# Patient Record
Sex: Male | Born: 1965 | Race: White | Hispanic: No | Marital: Married | Smoking: Never smoker
Health system: Southern US, Community
[De-identification: ages and names within clinical notes are randomized; demographics above are authoritative.]

## PROBLEM LIST (undated history)

## (undated) DIAGNOSIS — J189 Pneumonia, unspecified organism: Secondary | ICD-10-CM

## (undated) DIAGNOSIS — J45909 Unspecified asthma, uncomplicated: Secondary | ICD-10-CM

## (undated) DIAGNOSIS — M3500 Sicca syndrome, unspecified: Secondary | ICD-10-CM

## (undated) DIAGNOSIS — J302 Other seasonal allergic rhinitis: Secondary | ICD-10-CM

## (undated) DIAGNOSIS — K219 Gastro-esophageal reflux disease without esophagitis: Secondary | ICD-10-CM

## (undated) DIAGNOSIS — A159 Respiratory tuberculosis unspecified: Secondary | ICD-10-CM

## (undated) DIAGNOSIS — E785 Hyperlipidemia, unspecified: Secondary | ICD-10-CM

## (undated) HISTORY — PX: SQUAMOUS CELL CARCINOMA EXCISION: SHX2433

## (undated) HISTORY — PX: KNEE SURGERY: SHX244

## (undated) HISTORY — PX: HERNIA REPAIR: SHX51

## (undated) HISTORY — PX: COLONOSCOPY: SHX174

## (undated) HISTORY — DX: Sjogren syndrome, unspecified: M35.00

## (undated) HISTORY — PX: APPENDECTOMY: SHX54

## (undated) HISTORY — PX: WISDOM TOOTH EXTRACTION: SHX21

## (undated) HISTORY — PX: SHOULDER SURGERY: SHX246

---

## 2012-07-30 ENCOUNTER — Emergency Department: Payer: Self-pay | Admitting: Emergency Medicine

## 2012-07-30 ENCOUNTER — Ambulatory Visit: Payer: Self-pay | Admitting: Family Medicine

## 2012-07-30 LAB — BASIC METABOLIC PANEL
Anion Gap: 8 (ref 7–16)
Co2: 25 mmol/L (ref 21–32)
EGFR (Non-African Amer.): >60
Osmolality: 280 (ref 275–301)
Potassium: 3.7 mmol/L (ref 3.5–5.1)
Sodium: 140 mmol/L (ref 136–145)

## 2012-07-30 LAB — CBC
HCT: 40.8 % (ref 40.0–52.0)
MCH: 32.2 pg (ref 26.0–34.0)
Platelet: 215 10*3/uL (ref 150–440)
RBC: 4.46 10*6/uL (ref 4.40–5.90)
WBC: 5.8 10*3/uL (ref 3.8–10.6)

## 2012-07-30 LAB — TROPONIN I: Troponin-I: <0.02 ng/mL

## 2012-07-31 ENCOUNTER — Ambulatory Visit: Payer: Self-pay | Admitting: Family Medicine

## 2013-08-30 ENCOUNTER — Other Ambulatory Visit: Payer: Self-pay | Admitting: Family Medicine

## 2013-08-30 LAB — COMPREHENSIVE METABOLIC PANEL
Albumin: 3.9 g/dL (ref 3.4–5.0)
Alkaline Phosphatase: 62 U/L
Anion Gap: 2 — ABNORMAL LOW (ref 7–16)
BUN: 15 mg/dL (ref 7–18)
Bilirubin,Total: 0.9 mg/dL (ref 0.2–1.0)
Calcium, Total: 9 mg/dL (ref 8.5–10.1)
Chloride: 107 mmol/L (ref 98–107)
Co2: 29 mmol/L (ref 21–32)
Creatinine: 1.03 mg/dL (ref 0.60–1.30)
EGFR (African American): >60
EGFR (Non-African Amer.): >60
Glucose: 95 mg/dL (ref 65–99)
Osmolality: 276 (ref 275–301)
Potassium: 4.2 mmol/L (ref 3.5–5.1)
SGOT(AST): 38 U/L — ABNORMAL HIGH (ref 15–37)
SGPT (ALT): 69 U/L (ref 12–78)
Sodium: 138 mmol/L (ref 136–145)
Total Protein: 8.8 g/dL — ABNORMAL HIGH (ref 6.4–8.2)

## 2013-08-30 LAB — CBC WITH DIFFERENTIAL/PLATELET
Basophil #: 0 10*3/uL (ref 0.0–0.1)
Basophil %: 0.8 %
Eosinophil #: 0.1 10*3/uL (ref 0.0–0.7)
Eosinophil %: 1.1 %
HCT: 43.2 % (ref 40.0–52.0)
HGB: 15.3 g/dL (ref 13.0–18.0)
Lymphocyte #: 1.3 10*3/uL (ref 1.0–3.6)
Lymphocyte %: 24.4 %
MCH: 32.5 pg (ref 26.0–34.0)
MCHC: 35.4 g/dL (ref 32.0–36.0)
MCV: 92 fL (ref 80–100)
Monocyte #: 0.5 x10 3/mm (ref 0.2–1.0)
Monocyte %: 9.5 %
Neutrophil #: 3.5 10*3/uL (ref 1.4–6.5)
Neutrophil %: 64.2 %
Platelet: 232 10*3/uL (ref 150–440)
RBC: 4.7 10*6/uL (ref 4.40–5.90)
RDW: 12.7 % (ref 11.5–14.5)
WBC: 5.4 10*3/uL (ref 3.8–10.6)

## 2014-01-09 IMAGING — CT CT CHEST W/ CM
1 series · 15 of 32 positions shown, 19 images · IV contrast (APPLIED)
Comparison: none

REASON FOR EXAM: ADD ON CALL REPORT [DATE] chest pain
COMMENTS:

[Series 4: soft tissue · axial · 0.72mm/px · z∈[-162,+90]mm · 15 of 94 slices shown, 19 images]
[im 7/94  soft-tissue]
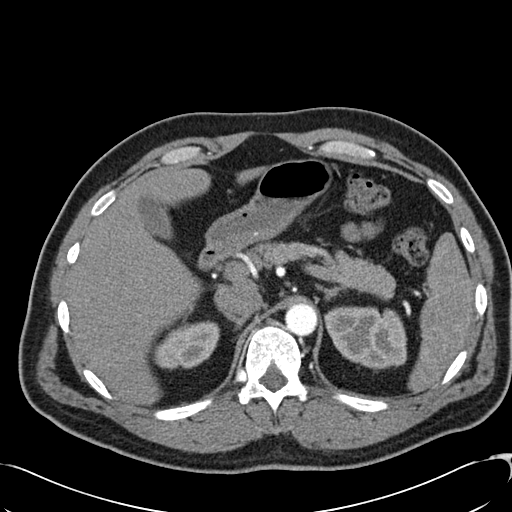
[im 7/94  bone]
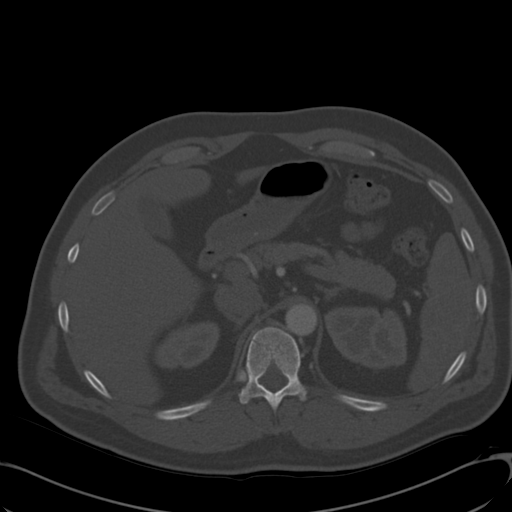
[im 13/94  soft-tissue]
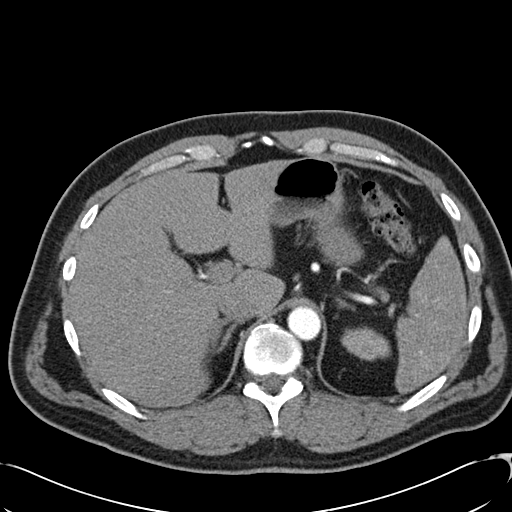
[im 19/94  soft-tissue]
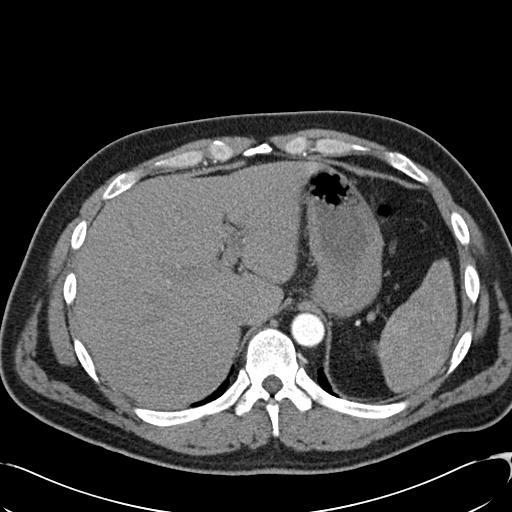
[im 28/94  soft-tissue]
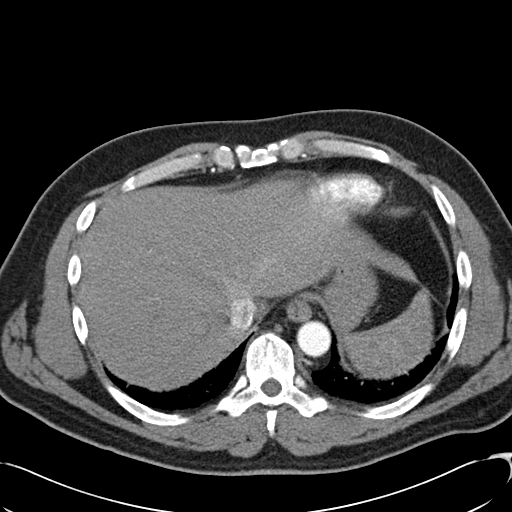
[im 34/94  soft-tissue]
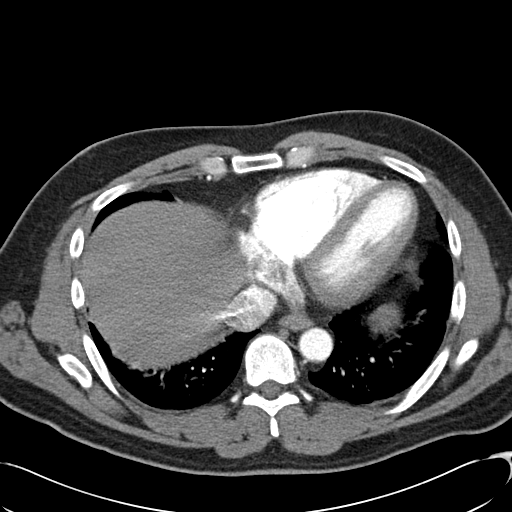
[im 40/94  soft-tissue]
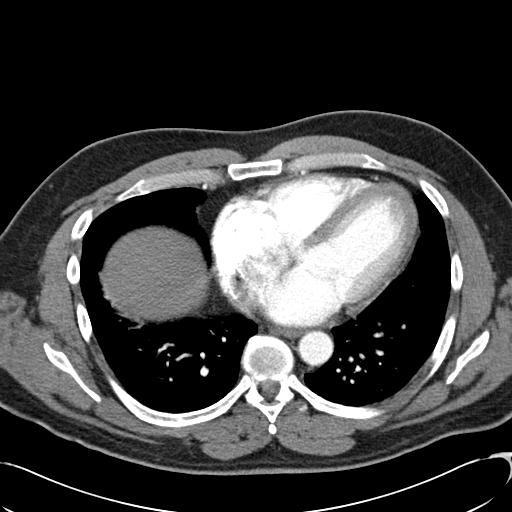
[im 49/94  soft-tissue]
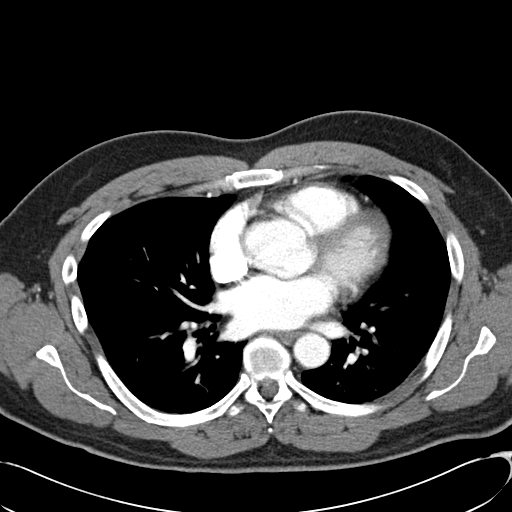
[im 55/94  soft-tissue]
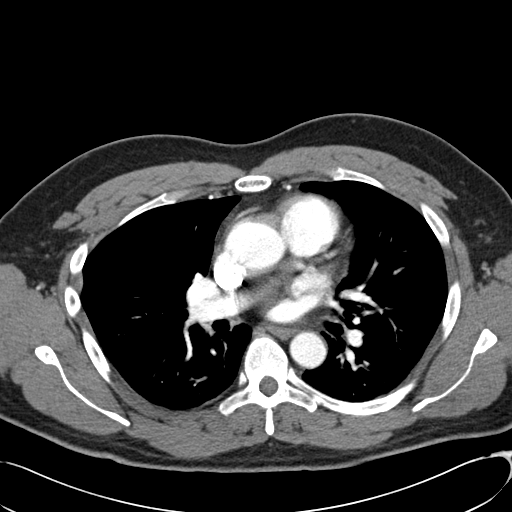
[im 61/94  soft-tissue]
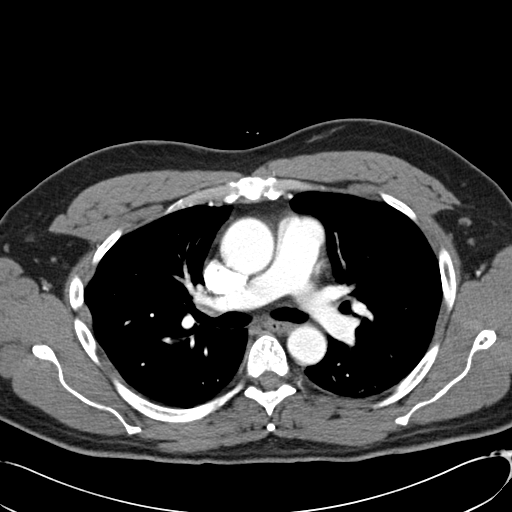
[im 61/94  bone]
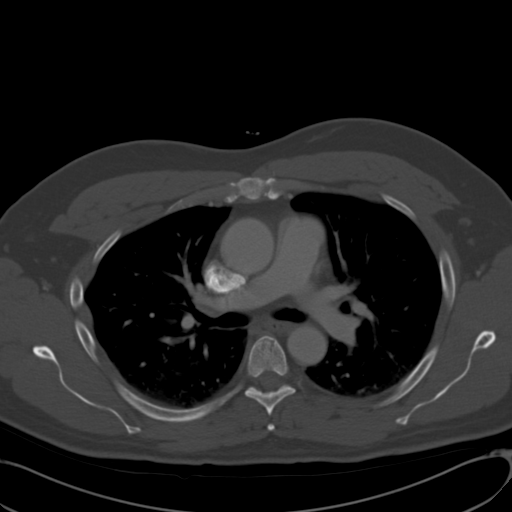
[im 67/94  soft-tissue]
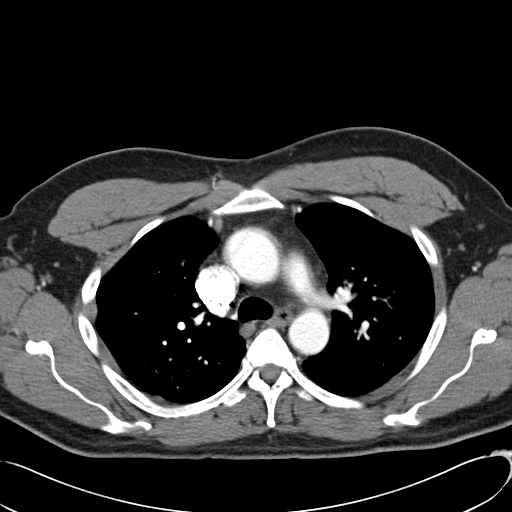
[im 76/94  soft-tissue]
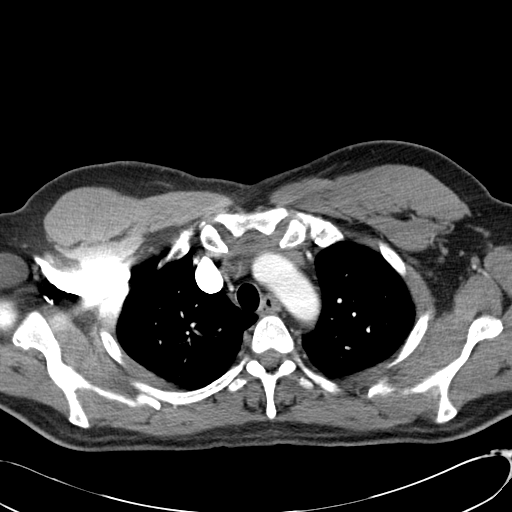
[im 82/94  soft-tissue]
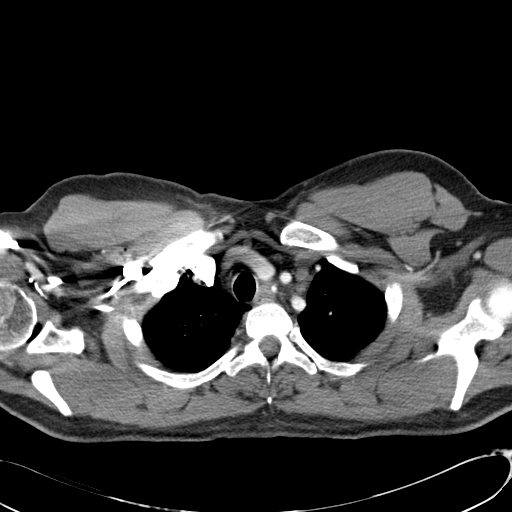
[im 82/94  lung]
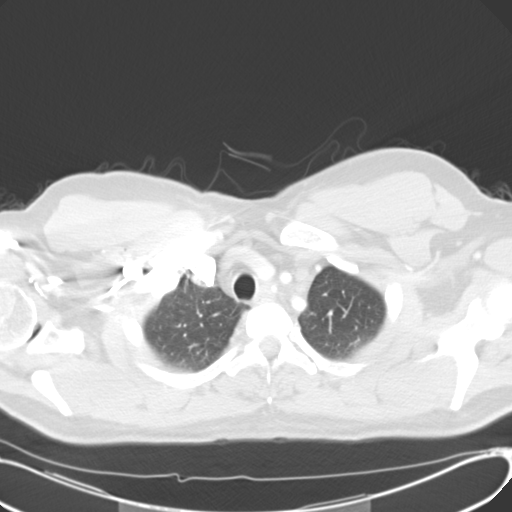
[im 85/94  lung]
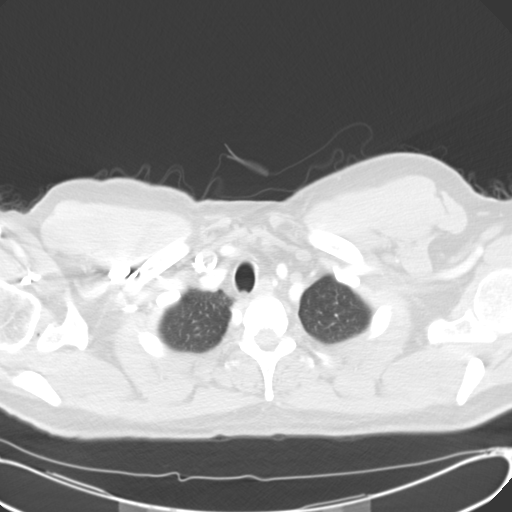
[im 88/94  soft-tissue]
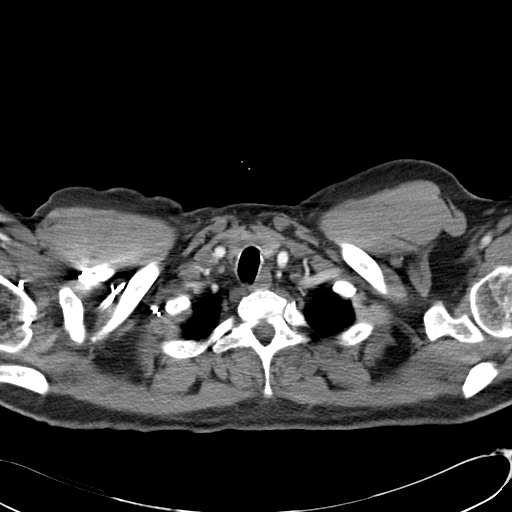
[im 88/94  lung]
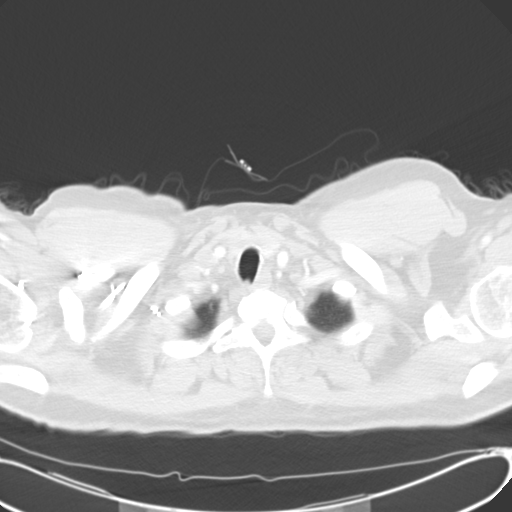
[im 91/94  lung]
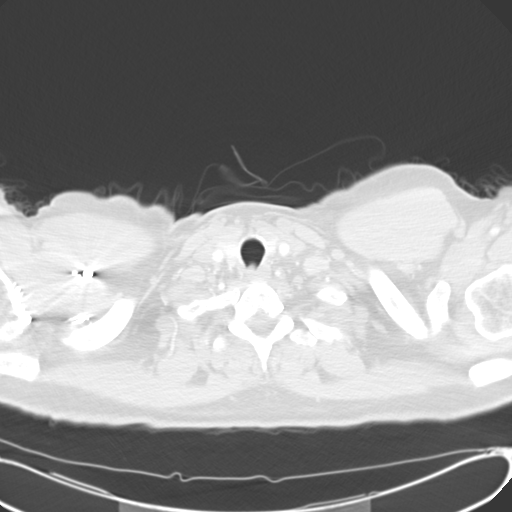

[15 of 32 positions shown; findings below may reference images not displayed]

PROCEDURE:     CT  - CT CHEST WITH CONTRAST  - July 31, 2012 [DATE]

RESULT:     Chest CT is performed with 100 mL of [id number] iodinated
intravenous contrast with images reconstructed at 3.0 mm slice thickness in
the axial plane. There is no previous CT for comparison.

There is respiratory motion artifact. There is minimal nodularity along the
major fissure in the right lung demonstrated on images 43 and 44 measuring 5
mm. Followup in 6 months without contrast to document stability is
recommended. This certainly may represent an area of fibrosis. Dependent
atelectasis is present. There is no definite infiltrate, evidence of
significant edema, effusion or pneumothorax. No focal endobronchial lesion
is evident. The included upper abdominal structures appear unremarkable. The
thoracic aorta is normal in caliber without dissection. The pulmonary
arterial system appears to opacify without filling defect to suggest
pulmonary embolism.
IMPRESSION: 1. No findings of pulmonary embolism.
2. No thoracic aortic aneurysm or dissection evident.
3. Minimal dependent atelectasis.
4. Nonspecific nodular density along the major fissure in the right
hemithorax. Followup noncontrast CT of the chest is recommended in the 6
months.

[REDACTED]

## 2014-06-10 LAB — CBC AND DIFFERENTIAL
HEMATOCRIT: 41 % (ref 41–53)
Hemoglobin: 14.8 g/dL (ref 13.5–17.5)
Neutrophils Absolute: 66 /uL
Platelets: 246 10*3/uL (ref 150–399)
WBC: 5.7 10*3/mL

## 2014-06-10 LAB — BASIC METABOLIC PANEL
BUN: 16 mg/dL (ref 4–21)
Creatinine: 1.2 mg/dL (ref 0.6–1.3)
Glucose: 109 mg/dL
Potassium: 4.5 mmol/L (ref 3.4–5.3)
Sodium: 138 mmol/L (ref 137–147)

## 2014-06-10 LAB — HEPATIC FUNCTION PANEL
ALK PHOS: 51 U/L (ref 25–125)
ALT: 33 U/L (ref 10–40)
AST: 20 U/L (ref 14–40)
BILIRUBIN, TOTAL: 1 mg/dL

## 2014-06-10 LAB — LIPID PANEL
Cholesterol: 191 mg/dL (ref 0–200)
HDL: 42 mg/dL (ref 35–70)
LDL Cholesterol: 125 mg/dL
LDL/HDL RATIO: 3
TRIGLYCERIDES: 121 mg/dL (ref 40–160)

## 2014-06-10 LAB — PSA: PSA: 0.4

## 2014-06-10 LAB — TSH: TSH: 1.79 u[IU]/mL (ref 0.41–5.90)

## 2014-11-12 DIAGNOSIS — K449 Diaphragmatic hernia without obstruction or gangrene: Secondary | ICD-10-CM | POA: Insufficient documentation

## 2014-11-12 DIAGNOSIS — N400 Enlarged prostate without lower urinary tract symptoms: Secondary | ICD-10-CM | POA: Insufficient documentation

## 2014-11-12 DIAGNOSIS — K219 Gastro-esophageal reflux disease without esophagitis: Secondary | ICD-10-CM | POA: Insufficient documentation

## 2014-11-12 DIAGNOSIS — R079 Chest pain, unspecified: Secondary | ICD-10-CM | POA: Insufficient documentation

## 2014-11-12 DIAGNOSIS — E7800 Pure hypercholesterolemia, unspecified: Secondary | ICD-10-CM | POA: Insufficient documentation

## 2014-11-12 DIAGNOSIS — C4491 Basal cell carcinoma of skin, unspecified: Secondary | ICD-10-CM | POA: Insufficient documentation

## 2014-11-12 DIAGNOSIS — J309 Allergic rhinitis, unspecified: Secondary | ICD-10-CM | POA: Insufficient documentation

## 2014-11-12 DIAGNOSIS — Z820 Family history of epilepsy and other diseases of the nervous system: Secondary | ICD-10-CM | POA: Insufficient documentation

## 2014-11-12 DIAGNOSIS — J45909 Unspecified asthma, uncomplicated: Secondary | ICD-10-CM | POA: Insufficient documentation

## 2014-11-12 DIAGNOSIS — G4730 Sleep apnea, unspecified: Secondary | ICD-10-CM | POA: Insufficient documentation

## 2014-12-23 ENCOUNTER — Ambulatory Visit (INDEPENDENT_AMBULATORY_CARE_PROVIDER_SITE_OTHER): Payer: Commercial Managed Care - PPO | Admitting: Family Medicine

## 2014-12-23 ENCOUNTER — Encounter: Payer: Self-pay | Admitting: Family Medicine

## 2014-12-23 VITALS — BP 100/62 | HR 68 | Temp 97.8°F | Resp 16 | Wt 210.0 lb

## 2014-12-23 DIAGNOSIS — Z8249 Family history of ischemic heart disease and other diseases of the circulatory system: Secondary | ICD-10-CM | POA: Diagnosis not present

## 2014-12-23 DIAGNOSIS — J45990 Exercise induced bronchospasm: Secondary | ICD-10-CM | POA: Diagnosis not present

## 2014-12-23 DIAGNOSIS — C801 Malignant (primary) neoplasm, unspecified: Secondary | ICD-10-CM | POA: Diagnosis not present

## 2014-12-23 DIAGNOSIS — Z1211 Encounter for screening for malignant neoplasm of colon: Secondary | ICD-10-CM

## 2014-12-23 DIAGNOSIS — IMO0002 Reserved for concepts with insufficient information to code with codable children: Secondary | ICD-10-CM

## 2014-12-23 MED ORDER — MOMETASONE FUROATE 200 MCG/ACT IN AERO: 2.0000 | INHALATION_SPRAY | Freq: Two times a day (BID) | RESPIRATORY_TRACT | Status: DC

## 2014-12-23 NOTE — Progress Notes (Signed)
Subjective:  Asthma He complains of chest tightness, cough, difficulty breathing, shortness of breath and wheezing. Primary symptoms comments: Only with exercise.. This is a chronic problem. The problem occurs constantly. His symptoms are aggravated by exercise. His symptoms are alleviated by rest. His past medical history is significant for asthma.   Pt reports that he stopped taking Dulera because of GI upset. He wants to discuss maybe try another inhaler or referral.  Patient reports a family history of DVT with factor 5 leiden deficiency.  Prior to Admission medications   Medication Sig Start Date End Date Taking? Authorizing Provider  albuterol (PROVENTIL HFA;VENTOLIN HFA) 108 (90 BASE) MCG/ACT inhaler Inhale 1-2 puffs into the lungs every 4 (four) hours as needed. 06/09/14   Historical Provider, MD  Cetirizine HCl 10 MG CAPS Take 1 tablet by mouth daily.    Historical Provider, MD  Cholecalciferol 1000 UNITS capsule Take 1 capsule by mouth daily.    Historical Provider, MD  lovastatin (MEVACOR) 20 MG tablet Take 1 tablet by mouth at bedtime. 06/09/14   Historical Provider, MD  mometasone-formoterol (DULERA) 100-5 MCG/ACT AERO Inhale 1-2 puffs into the lungs 2 (two) times daily. 06/09/14   Historical Provider, MD  Multiple Vitamins-Minerals (MENS MULTIVITAMIN PLUS) TABS Take 1 tablet by mouth daily.    Historical Provider, MD  omeprazole (PRILOSEC) 20 MG capsule Take 1 capsule by mouth 2 (two) times daily. 06/09/14   Historical Provider, MD    Patient Active Problem List   Diagnosis Date Noted  . Allergic rhinitis 11/12/2014  . 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 Basal cell carcinoma of skin 11/12/2014  . 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 Chest pain 11/12/2014  . Family history of neurological disease 11/12/2014  . Acid reflux 11/12/2014  . 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 Hypercholesteremia 11/12/2014  . 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  History   Social History  . Marital Status: Married    Spouse Name: N/A  . Number of Children: N/A  . Years of Education: N/A   Occupational History  . Not on file.   Social History Main Topics  . Smoking status: Not on file  . Smokeless tobacco: Not on file  . Alcohol Use: Not on file  . Drug Use: Not on file  . Sexual Activity: Not on file   Other Topics Concern  . Not on file   Social History Narrative  . No narrative on file    Allergies no known allergies  Review of Systems  Constitutional: Negative.   HENT: Negative.   Eyes: Negative.   Respiratory: Positive for cough, shortness of breath and wheezing.   Cardiovascular: Negative.   Gastrointestinal: Negative.   Genitourinary: Negative.   Musculoskeletal: Negative.   Skin: Negative.   Endo/Heme/Allergies: Negative.   Psychiatric/Behavioral: Negative.   All other systems reviewed and are negative.   Immunization History  Administered Date(s) Administered  . Hepatitis B 01/24/2002  . Pneumococcal Polysaccharide-23 06/09/2014  . Tdap 02/03/2008  . Typhoid Inactivated 01/24/2002   Objective:  There were no vitals taken for this visit.  Physical Exam  Constitutional: He is oriented to person, place, and time and well-developed, well-nourished, and in no distress.  HENT:  Head: Normocephalic and atraumatic.  Right Ear: External ear normal.  Left Ear: External ear normal.  Nose: Nose normal.  Mouth/Throat: Oropharynx is clear and moist.  Eyes: Conjunctivae and EOM are normal. Pupils are equal, round, and  reactive to light.  Neck: Normal range of motion. Neck supple.  Cardiovascular: Normal rate, regular rhythm, normal heart sounds and intact distal pulses.   Pulmonary/Chest: Effort normal and breath sounds normal.  Abdominal: Soft. Bowel sounds are normal.  Neurological: He is alert and oriented to person, place, and time. He has normal reflexes. Gait normal. GCS score is 15.  Skin:  Skin is warm and dry.  Right forearm squamous cell about 4cm   Psychiatric: Mood, memory, affect and judgment normal.    Lab Results  Component Value Date   WBC 5.7 06/10/2014   HGB 14.8 06/10/2014   HCT 41 06/10/2014   PLT 246 06/10/2014   GLUCOSE 95 08/30/2013   CHOL 191 06/10/2014   TRIG 121 06/10/2014   HDL 42 06/10/2014   LDLCALC 125 06/10/2014   TSH 1.79 06/10/2014   PSA 0.4 06/10/2014    CMP     Component Value Date/Time   NA 138 06/10/2014   NA 138 08/30/2013 1216   K 4.5 06/10/2014   K 4.2 08/30/2013 1216   CL 107 08/30/2013 1216   CO2 29 08/30/2013 1216   GLUCOSE 95 08/30/2013 1216   BUN 16 06/10/2014   BUN 15 08/30/2013 1216   CREATININE 1.2 06/10/2014   CREATININE 1.03 08/30/2013 1216   CALCIUM 9.0 08/30/2013 1216   PROT 8.8* 08/30/2013 1216   ALBUMIN 3.9 08/30/2013 1216   AST 20 06/10/2014   AST 38* 08/30/2013 1216   ALT 33 06/10/2014   ALT 69 08/30/2013 1216   ALKPHOS 51 06/10/2014   ALKPHOS 62 08/30/2013 1216   GFRNONAA >60 08/30/2013 1216   GFRAA >60 08/30/2013 1216    Assessment and Plan :  1. Exercise-induced asthma  - Spirometry with graph - Mometasone Furoate (ASMANEX HFA) 200 MCG/ACT AERO; Inhale 2 Inhalers into the lungs 2 (two) times daily. 2 puffs twice daily  Dispense: 2 Inhaler; Refill: 0  2. Encounter for screening colonoscopy  - Ambulatory referral to Gastroenterology  3. Family history of DVT  - Factor 5 leiden  4. 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  - Ambulatory referral to Dermatology  Miguel Aschoff MD [facility] 12/23/2014 3:35 PM

## 2014-12-28 LAB — FACTOR 5 LEIDEN

## 2014-12-29 ENCOUNTER — Encounter: Payer: Self-pay | Admitting: Family Medicine

## 2014-12-31 ENCOUNTER — Telehealth: Payer: Self-pay

## 2014-12-31 NOTE — Telephone Encounter (Signed)
-----   Message from Jerrol Banana., MD sent at 12/31/2014  2:19 PM EDT ----- Factor V test is normal.

## 2014-12-31 NOTE — Telephone Encounter (Signed)
LMTCB  aa 

## 2014-12-31 NOTE — Progress Notes (Signed)
LMTCB  aa 

## 2015-01-01 NOTE — Telephone Encounter (Signed)
Advised  ED 

## 2015-02-08 DIAGNOSIS — D0460 Carcinoma in situ of skin of unspecified upper limb, including shoulder: Secondary | ICD-10-CM

## 2015-02-08 HISTORY — DX: Carcinoma in situ of skin of unspecified upper limb, including shoulder: D04.60

## 2015-03-04 ENCOUNTER — Ambulatory Visit (INDEPENDENT_AMBULATORY_CARE_PROVIDER_SITE_OTHER): Payer: Commercial Managed Care - PPO | Admitting: Family Medicine

## 2015-03-04 ENCOUNTER — Encounter: Payer: Self-pay | Admitting: Family Medicine

## 2015-03-04 VITALS — BP 102/60 | HR 72 | Temp 98.0°F | Resp 16 | Wt 205.0 lb

## 2015-03-04 DIAGNOSIS — S46001A Unspecified injury of muscle(s) and tendon(s) of the rotator cuff of right shoulder, initial encounter: Secondary | ICD-10-CM | POA: Diagnosis not present

## 2015-03-04 DIAGNOSIS — J45909 Unspecified asthma, uncomplicated: Secondary | ICD-10-CM

## 2015-03-04 MED ORDER — NAPROXEN 500 MG PO TABS: 500.0000 mg | ORAL_TABLET | Freq: Two times a day (BID) | ORAL | Status: DC

## 2015-03-04 NOTE — Progress Notes (Signed)
Patient ID: Joseph Hendricks, male   DOB: [birth year], 49 y.o.   MRN: [id number]    Subjective:  HPI Pt is here for a 8 week follow up of asthma. LOV he was started on Asmanex 200 mg twice daily. He reports that he could not handle the twice daily but he started taking it once a day is doing well. He reports that he has been running more inside with the hot weather but his asthma is well controlled on this medication.   Prior to Admission medications   Medication Sig Start Date End Date Taking? Authorizing Provider  albuterol (PROVENTIL HFA;VENTOLIN HFA) 108 (90 BASE) MCG/ACT inhaler Inhale 1-2 puffs into the lungs every 4 (four) hours as needed. 06/09/14  Yes Historical Provider, MD  Cetirizine HCl 10 MG CAPS Take 1 tablet by mouth daily.   Yes Historical Provider, MD  Cholecalciferol 1000 UNITS capsule Take 1 capsule by mouth daily.   Yes Historical Provider, MD  lovastatin (MEVACOR) 20 MG tablet Take 1 tablet by mouth at bedtime. 06/09/14  Yes Historical Provider, MD  Mometasone Furoate [facility] HFA) 200 MCG/ACT AERO Inhale 2 Inhalers into the lungs 2 (two) times daily. 2 puffs twice daily Patient taking differently: Inhale 2 Inhalers into the lungs daily. 2 puffs twice daily 12/23/14  Yes Richard Maceo Pro., MD  Multiple Vitamins-Minerals (MENS MULTIVITAMIN PLUS) TABS Take 1 tablet by mouth daily.   Yes Historical Provider, MD  omeprazole (PRILOSEC) 20 MG capsule Take 1 capsule by mouth 2 (two) times daily. 06/09/14  Yes Historical Provider, MD    Patient Active Problem List   Diagnosis Date Noted  . Allergic rhinitis 11/12/2014  . 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 Basal cell carcinoma of skin 11/12/2014  . 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 Chest pain 11/12/2014  . Family history of neurological disease 11/12/2014  . Acid reflux 11/12/2014  . 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 Hypercholesteremia 11/12/2014  . 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  No pertinent past medical history.  Social History   Social History  . Marital Status: Married    Spouse Name: N/A  . Number of Children: N/A  . Years of Education: N/A   Occupational History  . Not on file.   Social History Main Topics  . Smoking status: Never Smoker   . Smokeless tobacco: Not on file  . Alcohol Use: Yes     Comment: occasionally, 3 drinks a week  . Drug Use: No  . Sexual Activity: Not on file   Other Topics Concern  . Not on file   Social History Narrative    No Known Allergies  Review of Systems  Constitutional: Negative.   Respiratory: Negative.   Cardiovascular: Negative.   Gastrointestinal: Negative.   Musculoskeletal: Positive for joint pain.  Skin: Negative.   Neurological: Negative.   Endo/Heme/Allergies: Negative.   Psychiatric/Behavioral: Negative.   All other systems reviewed and are negative.   Immunization History  Administered Date(s) Administered  . Hepatitis B 01/24/2002  . Pneumococcal Polysaccharide-23 06/09/2014  . Tdap 02/03/2008  . Typhoid Inactivated 01/24/2002   Objective:  BP 102/60 mmHg  Pulse 72  Temp(Src) 98 F (36.7 C) (Oral)  Resp 16  Wt 205 lb (92.987 kg)  SpO2 96%  Physical Exam  Constitutional: He is oriented to person, place, and time and well-developed, well-nourished, and in no distress.  HENT:  Head: Normocephalic and atraumatic.  Right Ear: External ear normal.  Left Ear: External  ear normal.  Nose: Nose normal.  Eyes: Conjunctivae are normal.  Neck: Neck supple.  Cardiovascular: Normal rate, regular rhythm and normal heart sounds.   Pulmonary/Chest: Effort normal and breath sounds normal.  Abdominal: Soft.  Musculoskeletal:  Some decreased range of motion of the right shoulder. Tenderness in the posterior shoulder.  Neurological: He is alert and oriented to person, place, and time. Gait normal.  No deficit of right upper extremity on exam.  Skin: Skin is warm and dry.  Psychiatric: Mood,  memory, affect and judgment normal.    Lab Results  Component Value Date   WBC 5.7 06/10/2014   HGB 14.8 06/10/2014   HCT 41 06/10/2014   PLT 246 06/10/2014   GLUCOSE 95 08/30/2013   CHOL 191 06/10/2014   TRIG 121 06/10/2014   HDL 42 06/10/2014   LDLCALC 125 06/10/2014   TSH 1.79 06/10/2014   PSA 0.4 06/10/2014    CMP     Component Value Date/Time   NA 138 06/10/2014   NA 138 08/30/2013 1216   K 4.5 06/10/2014   K 4.2 08/30/2013 1216   CL 107 08/30/2013 1216   CO2 29 08/30/2013 1216   GLUCOSE 95 08/30/2013 1216   BUN 16 06/10/2014   BUN 15 08/30/2013 1216   CREATININE 1.2 06/10/2014   CREATININE 1.03 08/30/2013 1216   CALCIUM 9.0 08/30/2013 1216   PROT 8.8* 08/30/2013 1216   ALBUMIN 3.9 08/30/2013 1216   AST 20 06/10/2014   AST 38* 08/30/2013 1216   ALT 33 06/10/2014   ALT 69 08/30/2013 1216   ALKPHOS 51 06/10/2014   ALKPHOS 62 08/30/2013 1216   BILITOT 0.9 08/30/2013 1216   GFRNONAA >60 08/30/2013 1216   GFRAA >60 08/30/2013 1216    Assessment and Plan :  1. Airway hyperreactivity, unspecified asthma severity, uncomplicated Improved on single puff. Pt gets hoarse with 2 puffs.  2. Rotator cuff injury, right, initial encounter Impingemnt of right shoulder--refer to ortho. - naproxen (NAPROSYN) 500 MG tablet; Take 1 tablet (500 mg total) by mouth 2 (two) times daily with a meal.  Dispense: 60 tablet; Refill: 12 3. AR 4.GERD Controlled. 5.HLD I have done the exam and reviewed the above chart and it is accurate to the best of my knowledge.  Miguel Aschoff MD [facility] 03/04/2015 4:15 PM

## 2015-04-27 ENCOUNTER — Encounter: Payer: Self-pay | Admitting: Family Medicine

## 2015-05-11 LAB — HM COLONOSCOPY

## 2015-05-13 ENCOUNTER — Ambulatory Visit (INDEPENDENT_AMBULATORY_CARE_PROVIDER_SITE_OTHER): Payer: Commercial Managed Care - PPO | Admitting: Family Medicine

## 2015-05-13 VITALS — BP 122/74 | HR 72 | Temp 98.1°F | Resp 16 | Ht 70.0 in | Wt 204.0 lb

## 2015-05-13 DIAGNOSIS — E785 Hyperlipidemia, unspecified: Secondary | ICD-10-CM | POA: Diagnosis not present

## 2015-05-13 DIAGNOSIS — J309 Allergic rhinitis, unspecified: Secondary | ICD-10-CM

## 2015-05-13 DIAGNOSIS — Z125 Encounter for screening for malignant neoplasm of prostate: Secondary | ICD-10-CM | POA: Diagnosis not present

## 2015-05-13 DIAGNOSIS — Z0000 Encounter for general adult medical examination without abnormal findings: Secondary | ICD-10-CM | POA: Diagnosis not present

## 2015-05-13 LAB — POCT URINALYSIS DIPSTICK
BILIRUBIN UA: NEGATIVE
Glucose, UA: NEGATIVE
KETONES UA: NEGATIVE
Leukocytes, UA: NEGATIVE
NITRITE UA: NEGATIVE
PH UA: 6.5
Protein, UA: NEGATIVE
RBC UA: NEGATIVE
Spec Grav, UA: 1.015
Urobilinogen, UA: NEGATIVE

## 2015-05-13 MED ORDER — FLUTICASONE PROPIONATE 50 MCG/ACT NA SUSP: 2.0000 | Freq: Every day | NASAL | Status: DC

## 2015-05-13 MED ORDER — LOVASTATIN 20 MG PO TABS: 20.0000 mg | ORAL_TABLET | Freq: Every day | ORAL | Status: DC

## 2015-05-13 NOTE — Progress Notes (Signed)
Patient ID: Joseph Hendricks, male   DOB: 1965-07-20, 49 y.o.   MRN: [id number] Patient: Joseph Hendricks, Male    DOB: 01/23/1966, 49 y.o.   MRN: [id number] Visit Date: 05/13/2015  Today's Provider: Wilhemena Durie, MD   Chief Complaint  Patient presents with  . Annual Exam   Subjective:  Joseph Hendricks is a 49 y.o. male who presents today for health maintenance and complete physical. He feels well. He reports exercising 2-3 times per week 3 miles each time. He reports he is sleeping well.  Patient complains of allergies/sinus pressure daily.  He is on daily Zyrtec and does not have any sinus headaches any more.  He does complain of daily sinus pressure.  He has no drainage and has never tried any of the nasal sprays.   Review of Systems  Constitutional: Negative for fever, chills, diaphoresis, activity change, appetite change, fatigue and unexpected weight change.  HENT: Positive for sinus pressure (No drainage, constant pressure all year round but worse during allergy season). Negative for congestion, dental problem, drooling, ear discharge, ear pain, facial swelling, hearing loss, mouth sores, nosebleeds, postnasal drip, rhinorrhea, sneezing, sore throat, tinnitus, trouble swallowing and voice change.   Eyes: Negative for photophobia, pain, discharge, redness, itching and visual disturbance.  Respiratory: Negative for apnea, cough, choking, chest tightness, shortness of breath, wheezing and stridor.   Cardiovascular: Negative for chest pain, palpitations and leg swelling.  Gastrointestinal: Negative for nausea, vomiting, abdominal pain, diarrhea, constipation, blood in stool, abdominal distention, anal bleeding and rectal pain.  Endocrine: Negative for cold intolerance, heat intolerance, polydipsia, polyphagia and polyuria.  Genitourinary: Negative for dysuria, urgency, frequency, hematuria, flank pain, decreased urine volume, discharge, penile swelling, scrotal swelling, enuresis,  difficulty urinating, genital sores, penile pain and testicular pain.  Musculoskeletal: Positive for arthralgias (Being seen by ortho). Negative for myalgias, back pain, joint swelling, gait problem, neck pain and neck stiffness.  Skin: Negative for color change, pallor, rash and wound.  Allergic/Immunologic: Negative for environmental allergies, food allergies and immunocompromised state.  Neurological: Negative for dizziness, tremors, seizures, syncope, facial asymmetry, speech difficulty, weakness, light-headedness, numbness and headaches.  Hematological: Negative for adenopathy. Does not bruise/bleed easily.  Psychiatric/Behavioral: Negative for suicidal ideas, hallucinations, behavioral problems, confusion, sleep disturbance, self-injury, dysphoric mood, decreased concentration and agitation. The patient is not nervous/anxious and is not hyperactive.     Social History   Social History  . Marital Status: Married    Spouse Name: N/A  . Number of Children: N/A  . Years of Education: N/A   Occupational History  . Not on file.   Social History Main Topics  . Smoking status: Never Smoker   . Smokeless tobacco: Not on file  . Alcohol Use: Yes     Comment: occasionally, 3 drinks a week  . Drug Use: No  . Sexual Activity: Not on file   Other Topics Concern  . Not on file   Social History Narrative    Patient Active Problem List   Diagnosis Date Noted  . Allergic rhinitis 11/12/2014  . 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 Basal cell carcinoma of skin 11/12/2014  . 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 Chest pain 11/12/2014  . Family history of neurological disease 11/12/2014  . Acid reflux 11/12/2014  . 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 Hypercholesteremia 11/12/2014  . 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 Appendectomy    . Hernia repair    .  Knee surgery      His family history includes Allergies in  his brother; Alzheimer's disease in his father; Arthritis in his mother; Cancer in his brother; Diabetes in his father; Healthy in his brother and sister.    Outpatient Prescriptions Prior to Visit  Medication Sig Dispense Refill  . albuterol (PROVENTIL HFA;VENTOLIN HFA) 108 (90 BASE) MCG/ACT inhaler Inhale 1-2 puffs into the lungs every 4 (four) hours as needed.    . Cetirizine HCl 10 MG CAPS Take 1 tablet by mouth daily.    . Cholecalciferol 1000 UNITS capsule Take 1 capsule by mouth daily.    Marland Kitchen lovastatin (MEVACOR) 20 MG tablet Take 1 tablet by mouth at bedtime.    . Mometasone Furoate (ASMANEX HFA) 200 MCG/ACT AERO Inhale 2 Inhalers into the lungs 2 (two) times daily. 2 puffs twice daily (Patient taking differently: Inhale 2 Inhalers into the lungs daily. 2 puffs twice daily) 2 Inhaler 0  . Multiple Vitamins-Minerals (MENS MULTIVITAMIN PLUS) TABS Take 1 tablet by mouth daily.    . naproxen (NAPROSYN) 500 MG tablet Take 1 tablet (500 mg total) by mouth 2 (two) times daily with a meal. 60 tablet 12  . omeprazole (PRILOSEC) 20 MG capsule Take 1 capsule by mouth 2 (two) times daily.     No facility-administered medications prior to visit.    Patient Care Team: Jerrol Banana., MD as PCP - General (Family Medicine)     Objective:   Vitals: There were no vitals filed for this visit.  Physical Exam  Constitutional: He is oriented to person, place, and time. He appears well-developed and well-nourished.  HENT:  Head: Normocephalic and atraumatic.  Right Ear: External ear normal.  Left Ear: External ear normal.  Nose: Nose normal.  Mouth/Throat: Oropharynx is clear and moist.  Eyes: Conjunctivae and EOM are normal. Pupils are equal, round, and reactive to light.  Neck: Normal range of motion. Neck supple.  Cardiovascular: Normal rate, regular rhythm, normal heart sounds and intact distal pulses.   Pulmonary/Chest: Effort normal and breath sounds normal.  Abdominal: Soft. Bowel  sounds are normal.  Genitourinary: Penis normal.  Musculoskeletal: Normal range of motion.  Has pain with abduction  of the right shoulder  Neurological: He is alert and oriented to person, place, and time.  Skin: Skin is warm and dry.  Psychiatric: He has a normal mood and affect. His behavior is normal. Judgment and thought content normal.     Depression Screen No flowsheet data found.    Assessment & Plan:     Routine Health Maintenance and Physical Exam  Exercise Activities and Dietary recommendations Goals    None      Immunization History  Administered Date(s) Administered  . Hepatitis B 01/24/2002  . Pneumococcal Polysaccharide-23 06/09/2014  . Tdap 02/03/2008  . Typhoid Inactivated 01/24/2002    Health Maintenance  Topic Date Due  . Hepatitis C Screening  05/09/1964  . HIV Screening  06/07/1979  . COLONOSCOPY  06/06/2014  . INFLUENZA VACCINE  02/08/2015  . TETANUS/TDAP  02/02/2018   1. Annual physical exam Normal colonoscopy 2 days ago, repeat 2026. - CBC With Differential/Platelet - Lipid Panel With LDL/HDL Ratio - COMPLETE METABOLIC PANEL WITH GFR - TSH  2. Prostate cancer screening  - PSA 3.Asthma/AR Try fluticasone nasal spray, if not better in a month refer to ENT.  I have done the exam and reviewed the above chart and it is accurate to the best of my knowledge.  Discussed health benefits of physical activity, and encouraged him to engage in regular exercise appropriate for his age and condition.    ------------------------------------------------------------------------------------------------------------

## 2015-05-18 ENCOUNTER — Telehealth: Payer: Self-pay | Admitting: Family Medicine

## 2015-05-18 LAB — LIPID PANEL WITH LDL/HDL RATIO
CHOLESTEROL TOTAL: 212 mg/dL — AB (ref 100–199)
HDL: 48 mg/dL (ref >39)
LDL Calculated: 138 mg/dL — ABNORMAL HIGH (ref 0–99)
LDl/HDL Ratio: 2.9 ratio units (ref 0.0–3.6)
TRIGLYCERIDES: 130 mg/dL (ref 0–149)
VLDL CHOLESTEROL CAL: 26 mg/dL (ref 5–40)

## 2015-05-18 LAB — CBC WITH DIFFERENTIAL/PLATELET

## 2015-05-18 LAB — CMP14+EGFR
ALBUMIN: 4.1 g/dL (ref 3.5–5.5)
ALT: 32 IU/L (ref 0–44)
AST: 23 IU/L (ref 0–40)
Albumin/Globulin Ratio: 1.2 (ref 1.1–2.5)
Alkaline Phosphatase: 50 IU/L (ref 39–117)
BUN / CREAT RATIO: 17 (ref 9–20)
BUN: 15 mg/dL (ref 6–24)
Bilirubin Total: 1.1 mg/dL (ref 0.0–1.2)
CALCIUM: 9.1 mg/dL (ref 8.7–10.2)
CO2: 21 mmol/L (ref 18–29)
CREATININE: 0.86 mg/dL (ref 0.76–1.27)
Chloride: 102 mmol/L (ref 97–106)
GFR, EST AFRICAN AMERICAN: 117 mL/min/{1.73_m2} (ref >59)
GFR, EST NON AFRICAN AMERICAN: 101 mL/min/{1.73_m2} (ref >59)
GLOBULIN, TOTAL: 3.3 g/dL (ref 1.5–4.5)
Glucose: 103 mg/dL — ABNORMAL HIGH (ref 65–99)
Potassium: 4.4 mmol/L (ref 3.5–5.2)
SODIUM: 141 mmol/L (ref 136–144)
TOTAL PROTEIN: 7.4 g/dL (ref 6.0–8.5)

## 2015-05-18 LAB — PSA: Prostate Specific Ag, Serum: 0.3 ng/mL (ref 0.0–4.0)

## 2015-05-18 LAB — TSH: TSH: 1.12 u[IU]/mL (ref 0.450–4.500)

## 2015-05-18 NOTE — Telephone Encounter (Signed)
Pt stated he was returning Ana's call. Thanks TNP

## 2015-05-19 NOTE — Telephone Encounter (Signed)
Please see lab message. Thank you -aa

## 2015-10-19 ENCOUNTER — Other Ambulatory Visit: Payer: Self-pay | Admitting: Family Medicine

## 2015-11-08 ENCOUNTER — Ambulatory Visit: Payer: Commercial Managed Care - PPO | Admitting: Family Medicine

## 2016-04-12 ENCOUNTER — Encounter: Payer: Self-pay | Admitting: Family Medicine

## 2016-04-12 ENCOUNTER — Encounter: Payer: Commercial Managed Care - PPO | Admitting: Family Medicine

## 2016-04-12 ENCOUNTER — Ambulatory Visit (INDEPENDENT_AMBULATORY_CARE_PROVIDER_SITE_OTHER): Payer: Commercial Managed Care - PPO | Admitting: Family Medicine

## 2016-04-12 VITALS — BP 110/74 | HR 64 | Temp 98.6°F | Resp 16 | Ht 70.0 in | Wt 210.0 lb

## 2016-04-12 DIAGNOSIS — Z0000 Encounter for general adult medical examination without abnormal findings: Secondary | ICD-10-CM | POA: Diagnosis not present

## 2016-04-12 DIAGNOSIS — Z125 Encounter for screening for malignant neoplasm of prostate: Secondary | ICD-10-CM

## 2016-04-12 DIAGNOSIS — Z1211 Encounter for screening for malignant neoplasm of colon: Secondary | ICD-10-CM

## 2016-04-12 DIAGNOSIS — Z23 Encounter for immunization: Secondary | ICD-10-CM

## 2016-04-12 LAB — POCT URINALYSIS DIPSTICK
BILIRUBIN UA: NEGATIVE
Blood, UA: NEGATIVE
GLUCOSE UA: NEGATIVE
KETONES UA: NEGATIVE
LEUKOCYTES UA: NEGATIVE
NITRITE UA: NEGATIVE
PH UA: 7
Protein, UA: NEGATIVE
SPEC GRAV UA: 1.01
Urobilinogen, UA: 0.2

## 2016-04-12 LAB — IFOBT (OCCULT BLOOD): IFOBT: NEGATIVE

## 2016-04-12 NOTE — Patient Instructions (Signed)
For scalp consider Selsun Blue or tar shampoo.

## 2016-04-12 NOTE — Progress Notes (Signed)
Patient: Joseph Hendricks, Male    DOB: 10-31-1965, 50 y.o.   MRN: [id number] Visit Date: 04/12/2016  Today's Provider: Wilhemena Durie, MD   Chief Complaint  Patient presents with  . Annual Exam   Subjective:    Annual physical exam Joseph Hendricks is a 50 y.o. male who presents today for health maintenance and complete physical. He feels well. He reports exercising, daily. Walking 2.5-3 miles a day. He reports he is sleeping well.  ----------------------------------------------------------------- Colonoscopy-05/11/15 internal hemorrhoids repeat 10 years  Immunization History  Administered Date(s) Administered  . Hepatitis B 01/24/2002  . Influenza-Unspecified 04/10/2015  . Pneumococcal Polysaccharide-23 06/09/2014  . Tdap 02/03/2008  . Typhoid Inactivated 01/24/2002     Review of Systems  Constitutional: Negative.   Eyes: Negative.   Respiratory: Negative.   Cardiovascular: Negative.   Gastrointestinal: Negative.   Endocrine: Negative.   Genitourinary: Negative.   Musculoskeletal: Negative.   Skin: Negative.   Allergic/Immunologic: Positive for environmental allergies.  Neurological: Positive for headaches.  Hematological: Negative.   Psychiatric/Behavioral: Negative.     Social History      He  reports that he has never smoked. He has never used smokeless tobacco. He reports that he drinks alcohol. He reports that he does not use drugs.       Social History   Social History  . Marital status: Married    Spouse name: N/A  . Number of children: N/A  . Years of education: N/A   Social History Main Topics  . Smoking status: Never Smoker  . Smokeless tobacco: Never Used  . Alcohol use Yes     Comment: occasionally, 3 drinks a week  . Drug use: No  . Sexual activity: Not Asked   Other Topics Concern  . None   Social History Narrative  . None    History reviewed. No pertinent past medical history.   Patient Active Problem List   Diagnosis Date Noted  . Allergic rhinitis 11/12/2014  . 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 Basal cell carcinoma of skin 11/12/2014  . 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 Chest pain 11/12/2014  . Family history of neurological disease 11/12/2014  . Acid reflux 11/12/2014  . 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 Hypercholesteremia 11/12/2014  . 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 APPENDECTOMY    . HERNIA REPAIR    . KNEE SURGERY      Family History        Family Status  Relation Status  . Mother Alive  . Father Deceased  . Sister Alive  . Brother Alive  . Brother Alive        His family history includes Allergies in his brother; Alzheimer's disease in his father; Arthritis in his mother; Cancer in his brother; Diabetes in his father; Healthy in his brother and sister.    No Known Allergies  Current Meds  Medication Sig  . Cetirizine HCl 10 MG CAPS Take 1 tablet by mouth daily.  . Cholecalciferol 1000 UNITS capsule Take 1 capsule by mouth daily.  . fluticasone (FLONASE) 50 MCG/ACT nasal spray Place 2 sprays into both nostrils daily.  Marland Kitchen lovastatin (MEVACOR) 20 MG tablet Take 1 tablet (20 mg total) by mouth at bedtime.  . Multiple Vitamins-Minerals (MENS MULTIVITAMIN PLUS) TABS Take 1 tablet by mouth daily.  Marland Kitchen PROAIR HFA 108 (90 Base) MCG/ACT inhaler INHALE ONE TO TWO PUFFS BY MOUTH  4 TIMES DAILY AS NEEDED    Patient Care Team: Jerrol Banana., MD as PCP - General (Family Medicine)     Objective:   Vitals: BP 110/74 (BP Location: Right Arm, Patient Position: Sitting, Cuff Size: Large)   Pulse 64   Temp 98.6 F (37 C) (Oral)   Resp 16   Ht 5\' 10"  (1.778 m)   Wt 210 lb (95.3 kg)   BMI 30.13 kg/m    Physical Exam  Constitutional: He is oriented to person, place, and time. He appears well-developed and well-nourished.  HENT:  Head: Normocephalic and atraumatic.  Right Ear: External ear  normal.  Left Ear: External ear normal.  Nose: Nose normal.  Mouth/Throat: Oropharynx is clear and moist.  Eyes: Conjunctivae and EOM are normal. Pupils are equal, round, and reactive to light. No scleral icterus.  Neck: Neck supple. No thyromegaly present.  Cardiovascular: Normal rate, regular rhythm, normal heart sounds and intact distal pulses.   Pulmonary/Chest: Effort normal and breath sounds normal.  Abdominal: Soft.  Genitourinary: Rectum normal, prostate normal and penis normal.  Lymphadenopathy:    He has no cervical adenopathy.  Neurological: He is alert and oriented to person, place, and time.  Skin: Skin is warm and dry.  Psychiatric: He has a normal mood and affect. His behavior is normal. Judgment and thought content normal.     Depression Screen No flowsheet data found.    Assessment & Plan:     Routine Health Maintenance and Physical Exam  Exercise Activities and Dietary recommendations Goals    None      Immunization History  Administered Date(s) Administered  . Hepatitis B 01/24/2002  . Influenza-Unspecified 04/10/2015  . Pneumococcal Polysaccharide-23 06/09/2014  . Tdap 02/03/2008  . Typhoid Inactivated 01/24/2002    Health Maintenance  Topic Date Due  . Hepatitis C Screening  08-02-1965  . HIV Screening  06/07/1979  . COLONOSCOPY  06/06/2014  . INFLUENZA VACCINE  02/08/2016  . TETANUS/TDAP  02/02/2018      Discussed health benefits of physical activity, and encouraged him to engage in regular exercise appropriate for his age and condition.    -------------------------------------------------------------------- I have done the exam and reviewed the above chart and it is accurate to the best of my knowledge.    Richard Cranford Mon, MD  [facility]

## 2016-04-15 LAB — COMPREHENSIVE METABOLIC PANEL
ALBUMIN: 4.4 g/dL (ref 3.5–5.5)
ALK PHOS: 48 IU/L (ref 39–117)
ALT: 27 IU/L (ref 0–44)
AST: 22 IU/L (ref 0–40)
Albumin/Globulin Ratio: 1.3 (ref 1.2–2.2)
BUN / CREAT RATIO: 17 (ref 9–20)
BUN: 15 mg/dL (ref 6–24)
Bilirubin Total: 1 mg/dL (ref 0.0–1.2)
CALCIUM: 9.6 mg/dL (ref 8.7–10.2)
CO2: 25 mmol/L (ref 18–29)
CREATININE: 0.9 mg/dL (ref 0.76–1.27)
Chloride: 101 mmol/L (ref 96–106)
GFR calc Af Amer: 114 mL/min/{1.73_m2} (ref >59)
GFR calc non Af Amer: 99 mL/min/{1.73_m2} (ref >59)
GLUCOSE: 102 mg/dL — AB (ref 65–99)
Globulin, Total: 3.3 g/dL (ref 1.5–4.5)
Potassium: 4.6 mmol/L (ref 3.5–5.2)
Sodium: 139 mmol/L (ref 134–144)
TOTAL PROTEIN: 7.7 g/dL (ref 6.0–8.5)

## 2016-04-15 LAB — CBC WITH DIFFERENTIAL/PLATELET
BASOS: 1 %
Basophils Absolute: 0 10*3/uL (ref 0.0–0.2)
EOS (ABSOLUTE): 0.1 10*3/uL (ref 0.0–0.4)
EOS: 2 %
Hematocrit: 40.9 % (ref 37.5–51.0)
Hemoglobin: 14.3 g/dL (ref 12.6–17.7)
IMMATURE GRANS (ABS): 0 10*3/uL (ref 0.0–0.1)
IMMATURE GRANULOCYTES: 0 %
LYMPHS: 25 %
Lymphocytes Absolute: 1.1 10*3/uL (ref 0.7–3.1)
MCH: 32.1 pg (ref 26.6–33.0)
MCHC: 35 g/dL (ref 31.5–35.7)
MCV: 92 fL (ref 79–97)
MONOS ABS: 0.6 10*3/uL (ref 0.1–0.9)
Monocytes: 15 %
NEUTROS PCT: 57 %
Neutrophils Absolute: 2.5 10*3/uL (ref 1.4–7.0)
PLATELETS: 233 10*3/uL (ref 150–379)
RBC: 4.46 x10E6/uL (ref 4.14–5.80)
RDW: 13 % (ref 12.3–15.4)
WBC: 4.3 10*3/uL (ref 3.4–10.8)

## 2016-04-15 LAB — PSA: PROSTATE SPECIFIC AG, SERUM: 0.4 ng/mL (ref 0.0–4.0)

## 2016-04-15 LAB — LIPID PANEL WITH LDL/HDL RATIO
CHOLESTEROL TOTAL: 190 mg/dL (ref 100–199)
HDL: 45 mg/dL (ref >39)
LDL Calculated: 123 mg/dL — ABNORMAL HIGH (ref 0–99)
LDl/HDL Ratio: 2.7 ratio units (ref 0.0–3.6)
Triglycerides: 108 mg/dL (ref 0–149)
VLDL CHOLESTEROL CAL: 22 mg/dL (ref 5–40)

## 2016-04-15 LAB — TSH: TSH: 1.5 u[IU]/mL (ref 0.450–4.500)

## 2016-04-18 ENCOUNTER — Telehealth: Payer: Self-pay

## 2016-04-18 NOTE — Telephone Encounter (Signed)
-----   Message from Jerrol Banana., MD sent at 04/18/2016  8:39 AM EDT ----- Labs in normal range.

## 2016-04-18 NOTE — Telephone Encounter (Signed)
Pt advised.   Thanks,   -Laura  

## 2016-05-08 ENCOUNTER — Telehealth: Payer: Self-pay | Admitting: Family Medicine

## 2016-05-08 NOTE — Telephone Encounter (Signed)
Please review-aa 

## 2016-05-08 NOTE — Telephone Encounter (Signed)
Pt is leaving for [place] on Friday.  He states he has been before.  He says he always has a problem with digestion for a couple of days while he is there.  He wants to know if you can recommend anything for him to take with him there.   Pt's call [phone]  Thanks Con Memos

## 2016-05-08 NOTE — Telephone Encounter (Signed)
Reflux,diarrhea?What type of problem.

## 2016-05-09 NOTE — Telephone Encounter (Signed)
Advised patient that per Dr. Rosanna Randy, he may start taking Beano and a probiotic along with Tums to help with symptoms. Patient verbalized understanding.

## 2016-05-09 NOTE — Telephone Encounter (Signed)
Spoke with patient-bloating of the abdomen bad where you don't even want to eat and some diarrhea also are his symptoms every time. And he is not on any reflux medication just takes TUMS when needed not often. He does not get any heartburn or indigestion issues there.-aa

## 2016-07-13 ENCOUNTER — Other Ambulatory Visit: Payer: Self-pay | Admitting: Family Medicine

## 2016-07-13 DIAGNOSIS — E785 Hyperlipidemia, unspecified: Secondary | ICD-10-CM

## 2016-09-05 ENCOUNTER — Ambulatory Visit (INDEPENDENT_AMBULATORY_CARE_PROVIDER_SITE_OTHER): Payer: Commercial Managed Care - PPO | Admitting: Family Medicine

## 2016-09-05 VITALS — BP 118/74 | HR 88 | Temp 99.0°F | Resp 14 | Wt 210.0 lb

## 2016-09-05 DIAGNOSIS — J029 Acute pharyngitis, unspecified: Secondary | ICD-10-CM

## 2016-09-05 DIAGNOSIS — J0101 Acute recurrent maxillary sinusitis: Secondary | ICD-10-CM

## 2016-09-05 LAB — POCT RAPID STREP A (OFFICE): Rapid Strep A Screen: NEGATIVE

## 2016-09-05 MED ORDER — AMOXICILLIN-POT CLAVULANATE 875-125 MG PO TABS: 1.0000 | ORAL_TABLET | Freq: Two times a day (BID) | ORAL | 0 refills | Status: DC

## 2016-09-05 NOTE — Progress Notes (Signed)
Joseph Hendricks  MRN: [id number] DOB: 1965-09-09  Subjective:  HPI  Patient is here due to not feeling well. Started to not feel well Friday evening 09/01/16 and kept getting worse over the past few days. Main symptoms is severe sore throat, feels the pain even at rest without talking or swallowing, painful to talk to swallow or to cough. All the symptoms present are up in the head-runny nose, post nasal drainage, some chills, sinus pressure, headache, ear pressure. No body aches or fever. He has been taking Advil for the pain and also Alkelsetzer plus cold and flu. Patient Active Problem List   Diagnosis Date Noted  . Allergic rhinitis 11/12/2014  . 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 Basal cell carcinoma of skin 11/12/2014  . 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 Chest pain 11/12/2014  . Family history of neurological disease 11/12/2014  . Acid reflux 11/12/2014  . 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 Hypercholesteremia 11/12/2014  . 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  Social History   Social History  . Marital status: Married    Spouse name: N/A  . Number of children: N/A  . Years of education: N/A   Occupational History  . Not on file.   Social History Main Topics  . Smoking status: Never Smoker  . Smokeless tobacco: Never Used  . Alcohol use Yes     Comment: occasionally, 3 drinks a week  . Drug use: No  . Sexual activity: Not on file   Other Topics Concern  . Not on file   Social History Narrative  . No narrative on file    Outpatient Encounter Prescriptions as of 09/05/2016  Medication Sig Note  . Cetirizine HCl 10 MG CAPS Take 1 tablet by mouth daily. 11/12/2014: Received from: [place]:   . Cholecalciferol 1000 UNITS capsule Take 1 capsule by mouth daily. 11/12/2014: Received from: [place]:   . lovastatin (MEVACOR) 20 MG tablet TAKE ONE TABLET  BY MOUTH ONCE DAILY AT BEDTIME   . Mometasone Furoate (ASMANEX HFA) 200 MCG/ACT AERO Inhale 2 Inhalers into the lungs 2 (two) times daily. 2 puffs twice daily   . Multiple Vitamins-Minerals (MENS MULTIVITAMIN PLUS) TABS Take 1 tablet by mouth daily. 11/12/2014: Received from: [place]:   . PROAIR HFA 108 (90 Base) MCG/ACT inhaler INHALE ONE TO TWO PUFFS BY MOUTH 4 TIMES DAILY AS NEEDED   . fluticasone (FLONASE) 50 MCG/ACT nasal spray Place 2 sprays into both nostrils daily. (Patient not taking: Reported on 09/05/2016)    No facility-administered encounter medications on file as of 09/05/2016.     No Known Allergies  Review of Systems  Constitutional: Positive for malaise/fatigue.  HENT: Positive for congestion, sinus pain and sore throat.        Runny nose, post nasal drainage, ear pressure  Respiratory: Positive for cough.   Cardiovascular: Negative.   Musculoskeletal: Negative.   Neurological: Positive for headaches.    Objective:  BP 118/74   Pulse 88   Temp 99 F (37.2 C)   Resp 14   Wt 210 lb (95.3 kg)   SpO2 98%   BMI 30.13 kg/m   Physical Exam  Constitutional: He is well-developed, well-nourished, and in no distress.  HENT:  Head: Normocephalic and atraumatic.  Nose: Right sinus exhibits maxillary sinus tenderness. Left sinus exhibits maxillary sinus tenderness.  Eyes: Conjunctivae are normal. Pupils are equal,  round, and reactive to light.  Cardiovascular: Normal rate, regular rhythm, normal heart sounds and intact distal pulses.   No murmur heard. Pulmonary/Chest: Effort normal and breath sounds normal. No respiratory distress. He has no wheezes.    Assessment and Plan :  1. Acute recurrent maxillary sinusitis Treat with Augmentin. Push fluids. Advised patient to try salt/water rinses. Follow as needed.  2. Sore throat Strep test negative today. Most likely viral issue with this symptom.  HPI, Exam and A&P transcribed under  direction and in the presence of Miguel Aschoff, MD.  - POCT rapid strep A I have done the exam and reviewed the chart and it is accurate to the best of my knowledge. Development worker, community has been used and  any errors in dictation or transcription are unintentional. Miguel Aschoff M.D. [facility]

## 2016-12-14 DIAGNOSIS — Z85828 Personal history of other malignant neoplasm of skin: Secondary | ICD-10-CM | POA: Diagnosis not present

## 2016-12-14 DIAGNOSIS — D225 Melanocytic nevi of trunk: Secondary | ICD-10-CM | POA: Diagnosis not present

## 2016-12-14 DIAGNOSIS — D2262 Melanocytic nevi of left upper limb, including shoulder: Secondary | ICD-10-CM | POA: Diagnosis not present

## 2016-12-26 ENCOUNTER — Ambulatory Visit (INDEPENDENT_AMBULATORY_CARE_PROVIDER_SITE_OTHER): Payer: Commercial Managed Care - PPO | Admitting: Family Medicine

## 2016-12-26 ENCOUNTER — Encounter: Payer: Self-pay | Admitting: General Surgery

## 2016-12-26 VITALS — BP 140/66 | HR 68 | Temp 99.0°F | Resp 16 | Wt 212.0 lb

## 2016-12-26 DIAGNOSIS — E7800 Pure hypercholesterolemia, unspecified: Secondary | ICD-10-CM | POA: Diagnosis not present

## 2016-12-26 DIAGNOSIS — G4709 Other insomnia: Secondary | ICD-10-CM

## 2016-12-26 DIAGNOSIS — J301 Allergic rhinitis due to pollen: Secondary | ICD-10-CM

## 2016-12-26 DIAGNOSIS — K4090 Unilateral inguinal hernia, without obstruction or gangrene, not specified as recurrent: Secondary | ICD-10-CM

## 2016-12-26 NOTE — Progress Notes (Signed)
Joseph Hendricks  MRN: [id number] DOB: 11/30/1965  Subjective:  HPI  Patient is here to discuss possible hernia he thinks he has. Symptoms are right lower abdominal/groin discomfort with sneezing, coughing, walking and the longer he walks he develops burning sensation, some swelling. He did notice some right testicular discomfort a few weeks back at first and then this has improved and 2 weeks now noticed the protruding. Has had some heartburn, indigestion and some abdominal discomfort. He does have more stress with his job right now. Bowels have been normal. No vomiting, no urinary burning or pain. No chills or fever. Depression screen [facility] 2/9 12/26/2016  Decreased Interest 0  Down, Depressed, Hopeless 0  PHQ - 2 Score 0  Altered sleeping 3  Tired, decreased energy 1  Change in appetite 1  Feeling bad or failure about yourself  0  Trouble concentrating 0  Moving slowly or fidgety/restless 1  Suicidal thoughts 0  PHQ-9 Score 6   Patient Active Problem List   Diagnosis Date Noted  . Allergic rhinitis 11/12/2014  . 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 Basal cell carcinoma of skin 11/12/2014  . 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 Chest pain 11/12/2014  . Family history of neurological disease 11/12/2014  . Acid reflux 11/12/2014  . 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 Hypercholesteremia 11/12/2014  . 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  Social History   Social History  . Marital status: Married    Spouse name: N/A  . Number of children: N/A  . Years of education: N/A   Occupational History  . Not on file.   Social History Main Topics  . Smoking status: Never Smoker  . Smokeless tobacco: Never Used  . Alcohol use Yes     Comment: occasionally, 3 drinks a week  . Drug use: No  . Sexual activity: Not on file   Other Topics Concern  . Not on file   Social History Narrative  . No narrative on file    Outpatient  Encounter Prescriptions as of 12/26/2016  Medication Sig Note  . Cetirizine HCl 10 MG CAPS Take 1 tablet by mouth daily. 11/12/2014: Received from: [place]:   . Cholecalciferol 1000 UNITS capsule Take 1 capsule by mouth daily. 11/12/2014: Received from: [place]:   . lovastatin (MEVACOR) 20 MG tablet TAKE ONE TABLET BY MOUTH ONCE DAILY AT BEDTIME   . Multiple Vitamins-Minerals (MENS MULTIVITAMIN PLUS) TABS Take 1 tablet by mouth daily. 11/12/2014: Received from: [place]:   . PROAIR HFA 108 (90 Base) MCG/ACT inhaler INHALE ONE TO TWO PUFFS BY MOUTH 4 TIMES DAILY AS NEEDED   . [DISCONTINUED] amoxicillin-clavulanate (AUGMENTIN) 875-125 MG tablet Take 1 tablet by mouth 2 (two) times daily.   . [DISCONTINUED] fluticasone (FLONASE) 50 MCG/ACT nasal spray Place 2 sprays into both nostrils daily. (Patient not taking: Reported on 09/05/2016)   . [DISCONTINUED] Mometasone Furoate (ASMANEX HFA) 200 MCG/ACT AERO Inhale 2 Inhalers into the lungs 2 (two) times daily. 2 puffs twice daily    No facility-administered encounter medications on file as of 12/26/2016.     No Known Allergies  Review of Systems  Constitutional: Positive for malaise/fatigue.  Respiratory: Negative.   Cardiovascular: Negative.   Gastrointestinal: Positive for abdominal pain and heartburn.       Lower abdominal swelling/protruding on the right  Genitourinary: Negative.   Musculoskeletal: Negative.   Psychiatric/Behavioral: The  patient has insomnia.     Objective:  BP 140/66   Pulse 68   Temp 99 F (37.2 C)   Resp 16   Wt 212 lb (96.2 kg)   BMI 30.42 kg/m   Physical Exam  Constitutional: He is oriented to person, place, and time and well-developed, well-nourished, and in no distress.  HENT:  Head: Normocephalic and atraumatic.  Eyes: Conjunctivae are normal. Pupils are equal, round, and reactive to light.  Neck: Normal range  of motion. Neck supple.  Cardiovascular: Normal rate, regular rhythm, normal heart sounds and intact distal pulses.  Exam reveals no gallop.   No murmur heard. Pulmonary/Chest: Effort normal and breath sounds normal. No respiratory distress. He has no wheezes.  Abdominal: Soft. There is no tenderness. A hernia is present. Hernia confirmed positive in the right inguinal area (moderate size).  Musculoskeletal: He exhibits no edema or tenderness.  Neurological: He is alert and oriented to person, place, and time.  Psychiatric: Mood, memory, affect and judgment normal.   Assessment and Plan :  1. Right inguinal hernia Refer back to Dr Jamal Collin for further work up. Follow as needed. - Ambulatory referral to General Surgery  2. Hypercholesteremia 3. Seasonal allergic rhinitis due to pollen 4. Insomnia Discussed this in detail. Advised patient to try Melatonin at first and then NyQuil. Follow as needed. HPI, Exam and A&P transcribed by Theressa Millard, RMA under direction and in the presence of Miguel Aschoff, MD. I have done the exam and reviewed the chart and it is accurate to the best of my knowledge. Development worker, community has been used and  any errors in dictation or transcription are unintentional. Miguel Aschoff M.D. [facility]

## 2016-12-26 NOTE — Patient Instructions (Signed)
Try Melatonin and then can try NyQuil.

## 2017-01-01 ENCOUNTER — Ambulatory Visit: Payer: Self-pay | Admitting: General Surgery

## 2017-01-03 ENCOUNTER — Telehealth: Payer: Self-pay | Admitting: General Surgery

## 2017-01-03 NOTE — Telephone Encounter (Signed)
LEFT MESSAGE FOR PATIENT TO CALL & SCHEDULE AN APPOINTMENT.

## 2017-01-08 DIAGNOSIS — K4091 Unilateral inguinal hernia, without obstruction or gangrene, recurrent: Secondary | ICD-10-CM | POA: Diagnosis not present

## 2017-01-18 ENCOUNTER — Encounter: Payer: Self-pay | Admitting: General Surgery

## 2017-01-22 DIAGNOSIS — K4091 Unilateral inguinal hernia, without obstruction or gangrene, recurrent: Secondary | ICD-10-CM | POA: Diagnosis not present

## 2017-01-23 ENCOUNTER — Encounter: Payer: Self-pay | Admitting: *Deleted

## 2017-06-11 ENCOUNTER — Other Ambulatory Visit: Payer: Self-pay

## 2017-06-11 ENCOUNTER — Encounter: Payer: Self-pay | Admitting: Family Medicine

## 2017-06-11 ENCOUNTER — Ambulatory Visit (INDEPENDENT_AMBULATORY_CARE_PROVIDER_SITE_OTHER): Payer: Commercial Managed Care - PPO | Admitting: Family Medicine

## 2017-06-11 VITALS — BP 128/62 | HR 80 | Temp 98.2°F | Resp 16 | Ht 70.5 in | Wt 217.8 lb

## 2017-06-11 DIAGNOSIS — Z0000 Encounter for general adult medical examination without abnormal findings: Secondary | ICD-10-CM | POA: Diagnosis not present

## 2017-06-11 DIAGNOSIS — G4709 Other insomnia: Secondary | ICD-10-CM

## 2017-06-11 DIAGNOSIS — Z125 Encounter for screening for malignant neoplasm of prostate: Secondary | ICD-10-CM | POA: Diagnosis not present

## 2017-06-11 DIAGNOSIS — Z1211 Encounter for screening for malignant neoplasm of colon: Secondary | ICD-10-CM | POA: Diagnosis not present

## 2017-06-11 DIAGNOSIS — R739 Hyperglycemia, unspecified: Secondary | ICD-10-CM

## 2017-06-11 LAB — POCT URINALYSIS DIPSTICK
Bilirubin, UA: NEGATIVE
Glucose, UA: NEGATIVE
KETONES UA: NEGATIVE
Leukocytes, UA: NEGATIVE
Nitrite, UA: NEGATIVE
PROTEIN UA: NEGATIVE
RBC UA: NEGATIVE
SPEC GRAV UA: 1.015 (ref 1.010–1.025)
UROBILINOGEN UA: 0.2 U/dL
pH, UA: 5 (ref 5.0–8.0)

## 2017-06-11 LAB — IFOBT (OCCULT BLOOD): IMMUNOLOGICAL FECAL OCCULT BLOOD TEST: NEGATIVE

## 2017-06-11 MED ORDER — MELATONIN 10 MG PO TABS: 1.0000 | ORAL_TABLET | Freq: Every day | ORAL | 0 refills | Status: DC

## 2017-06-11 NOTE — Patient Instructions (Signed)
Try Melatonin at bedtime. Over the counter.

## 2017-06-11 NOTE — Progress Notes (Signed)
Patient: Joseph Hendricks, Male    DOB: 08/21/1965, 51 y.o.   MRN: [id number] Visit Date: 06/11/2017  Today's Provider: Wilhemena Durie, MD   Chief Complaint  Patient presents with  . Annual Exam   Subjective:  Joseph Hendricks is a 51 y.o. male who presents today for health maintenance and complete physical. He feels fairly well. He reports exercising walking 1 1/2 to 2 miles 4 times a week at this time . He reports he is sleeping fairly well.  Patient states he had colonoscopy in 2016 with Dr Elliott-records requested.  Review of Systems  Constitutional: Negative.   HENT: Negative.   Eyes: Negative.   Respiratory: Negative.   Cardiovascular: Negative.   Gastrointestinal: Negative.   Endocrine: Negative.   Genitourinary: Positive for testicular pain.  Musculoskeletal: Positive for arthralgias.  Skin: Negative.   Allergic/Immunologic: Positive for environmental allergies.  Neurological: Negative.   Hematological: Negative.   Psychiatric/Behavioral: Positive for sleep disturbance. The patient is nervous/anxious.     Social History   Socioeconomic History  . Marital status: Married    Spouse name: Not on file  . Number of children: Not on file  . Years of education: Not on file  . Highest education level: Not on file  Social Needs  . Financial resource strain: Not on file  . Food insecurity - worry: Not on file  . Food insecurity - inability: Not on file  . Transportation needs - medical: Not on file  . Transportation needs - non-medical: Not on file  Occupational History  . Not on file  Tobacco Use  . Smoking status: Never Smoker  . Smokeless tobacco: Never Used  Substance and Sexual Activity  . Alcohol use: Yes    Comment: occasionally, 3 drinks a week  . Drug use: No  . Sexual activity: Not on file  Other Topics Concern  . Not on file  Social History Narrative  . Not on file    Patient Active Problem List   Diagnosis Date Noted  . Allergic rhinitis  11/12/2014  . 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 Basal cell carcinoma of skin 11/12/2014  . 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 Chest pain 11/12/2014  . Family history of neurological disease 11/12/2014  . Acid reflux 11/12/2014  . 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 Hypercholesteremia 11/12/2014  . 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 APPENDECTOMY    . HERNIA REPAIR    . KNEE SURGERY      His family history includes Allergies in his brother; Alzheimer's disease in his father; Arthritis in his mother; Cancer in his brother; Diabetes in his father; Healthy in his brother and sister.     Outpatient Encounter Medications as of 06/11/2017  Medication Sig Note  . Cetirizine HCl 10 MG CAPS Take 1 tablet by mouth daily. 11/12/2014: Received from: [place]:   . Cholecalciferol 1000 UNITS capsule Take 1 capsule by mouth daily. 11/12/2014: Received from: [place]:   . lovastatin (MEVACOR) 20 MG tablet TAKE ONE TABLET BY MOUTH ONCE DAILY AT BEDTIME   . Multiple Vitamins-Minerals (MENS MULTIVITAMIN PLUS) TABS Take 1 tablet by mouth daily. 11/12/2014: Received from: [place]:   . PROAIR HFA 108 (90 Base) MCG/ACT inhaler INHALE ONE TO TWO PUFFS BY MOUTH 4 TIMES DAILY AS NEEDED    No facility-administered encounter medications on file as of 06/11/2017.  Patient Care Team: Jerrol Banana., MD as PCP - General (Family Medicine)      Objective:   Vitals:  Vitals:   06/11/17 1419  BP: 128/62  Pulse: 80  Resp: 16  Temp: 98.2 F (36.8 C)  Weight: 217 lb 12.8 oz (98.8 kg)  Height: 5' 10.5" (1.791 m)    Physical Exam  Constitutional: He is oriented to person, place, and time. He appears well-developed and well-nourished.  HENT:  Head: Normocephalic and atraumatic.  Right Ear: External ear normal.  Left Ear: External  ear normal.  Mouth/Throat: Oropharynx is clear and moist.  Eyes: Conjunctivae are normal. Pupils are equal, round, and reactive to light.  Neck: Normal range of motion. Neck supple.  Cardiovascular: Normal rate, regular rhythm, normal heart sounds and intact distal pulses. Exam reveals no gallop.  No murmur heard. Pulmonary/Chest: Effort normal and breath sounds normal. No respiratory distress. He has no wheezes.  Abdominal: Soft. He exhibits no distension. There is no tenderness.  Genitourinary: Rectum normal, prostate normal and penis normal. Rectal exam shows guaiac negative stool. No penile tenderness.  Musculoskeletal: He exhibits no edema or tenderness.  Neurological: He is alert and oriented to person, place, and time. No cranial nerve deficit. Coordination normal.  Skin: No rash noted. No erythema.  Psychiatric: He has a normal mood and affect. His behavior is normal. Judgment and thought content normal.     Depression Screen PHQ 2/9 Scores 06/11/2017 12/26/2016  PHQ - 2 Score 0 0  PHQ- 9 Score 5 6   Assessment & Plan:   1. Annual physical exam - CBC with Differential/Platelet - Comprehensive metabolic panel - Lipid Profile - POCT Urinalysis Dipstick - TSH  2. Prostate cancer screening - PSA  3. Colon cancer screening - IFOBT POC (occult bld, rslt in office)  4. Hyperglycemia - HgB A1c  5. Other insomnia Try Melatonin OTC. Follow as needed. - Melatonin 10 MG TABS; Take 1 tablet by mouth at bedtime.  Dispense: 30 tablet; Refill: 0  Discussed health benefits of physical activity, and encouraged him to engage in regular exercise appropriate for his age and condition.   HPI, Exam and A&P transcribed by Tiffany Kocher, RMA under direction and in the presence of Miguel Aschoff, MD.

## 2017-06-12 ENCOUNTER — Encounter: Payer: Self-pay | Admitting: Unknown Physician Specialty

## 2017-06-13 ENCOUNTER — Other Ambulatory Visit: Payer: Self-pay | Admitting: Family Medicine

## 2017-06-13 DIAGNOSIS — Z0000 Encounter for general adult medical examination without abnormal findings: Secondary | ICD-10-CM | POA: Diagnosis not present

## 2017-06-13 DIAGNOSIS — R739 Hyperglycemia, unspecified: Secondary | ICD-10-CM | POA: Diagnosis not present

## 2017-06-13 DIAGNOSIS — Z125 Encounter for screening for malignant neoplasm of prostate: Secondary | ICD-10-CM | POA: Diagnosis not present

## 2017-06-14 LAB — CBC WITH DIFFERENTIAL/PLATELET
BASOS: 1 %
Basophils Absolute: 0 10*3/uL (ref 0.0–0.2)
EOS (ABSOLUTE): 0.1 10*3/uL (ref 0.0–0.4)
EOS: 2 %
HEMATOCRIT: 43.3 % (ref 37.5–51.0)
Hemoglobin: 15.2 g/dL (ref 13.0–17.7)
Immature Grans (Abs): 0.1 10*3/uL (ref 0.0–0.1)
Immature Granulocytes: 1 %
LYMPHS ABS: 1.2 10*3/uL (ref 0.7–3.1)
Lymphs: 25 %
MCH: 32.5 pg (ref 26.6–33.0)
MCHC: 35.1 g/dL (ref 31.5–35.7)
MCV: 93 fL (ref 79–97)
MONOS ABS: 0.5 10*3/uL (ref 0.1–0.9)
Monocytes: 11 %
NEUTROS ABS: 3 10*3/uL (ref 1.4–7.0)
Neutrophils: 60 %
Platelets: 222 10*3/uL (ref 150–379)
RBC: 4.68 x10E6/uL (ref 4.14–5.80)
RDW: 13.3 % (ref 12.3–15.4)
WBC: 5 10*3/uL (ref 3.4–10.8)

## 2017-06-14 LAB — COMPREHENSIVE METABOLIC PANEL
A/G RATIO: 1.2 (ref 1.2–2.2)
ALT: 29 IU/L (ref 0–44)
AST: 26 IU/L (ref 0–40)
Albumin: 4.2 g/dL (ref 3.5–5.5)
Alkaline Phosphatase: 47 IU/L (ref 39–117)
BUN/Creatinine Ratio: 13 (ref 9–20)
BUN: 12 mg/dL (ref 6–24)
Bilirubin Total: 0.7 mg/dL (ref 0.0–1.2)
CALCIUM: 9.3 mg/dL (ref 8.7–10.2)
CO2: 23 mmol/L (ref 20–29)
CREATININE: 0.91 mg/dL (ref 0.76–1.27)
Chloride: 104 mmol/L (ref 96–106)
GFR, EST AFRICAN AMERICAN: 111 mL/min/{1.73_m2} (ref >59)
GFR, EST NON AFRICAN AMERICAN: 96 mL/min/{1.73_m2} (ref >59)
GLOBULIN, TOTAL: 3.5 g/dL (ref 1.5–4.5)
Glucose: 108 mg/dL — ABNORMAL HIGH (ref 65–99)
POTASSIUM: 4.7 mmol/L (ref 3.5–5.2)
SODIUM: 140 mmol/L (ref 134–144)
TOTAL PROTEIN: 7.7 g/dL (ref 6.0–8.5)

## 2017-06-14 LAB — LIPID PANEL W/O CHOL/HDL RATIO
CHOLESTEROL TOTAL: 184 mg/dL (ref 100–199)
HDL: 43 mg/dL (ref >39)
LDL Calculated: 120 mg/dL — ABNORMAL HIGH (ref 0–99)
TRIGLYCERIDES: 107 mg/dL (ref 0–149)
VLDL Cholesterol Cal: 21 mg/dL (ref 5–40)

## 2017-06-14 LAB — PSA: PROSTATE SPECIFIC AG, SERUM: 0.4 ng/mL (ref 0.0–4.0)

## 2017-06-14 LAB — TSH: TSH: 1.08 u[IU]/mL (ref 0.450–4.500)

## 2017-06-14 LAB — HGB A1C W/O EAG: Hgb A1c MFr Bld: 5.6 % (ref 4.8–5.6)

## 2017-06-21 ENCOUNTER — Encounter: Payer: Self-pay | Admitting: Family Medicine

## 2017-07-12 ENCOUNTER — Encounter: Payer: Commercial Managed Care - PPO | Admitting: Family Medicine

## 2017-10-24 ENCOUNTER — Other Ambulatory Visit: Payer: Self-pay | Admitting: Family Medicine

## 2017-10-24 DIAGNOSIS — E785 Hyperlipidemia, unspecified: Secondary | ICD-10-CM

## 2017-12-18 ENCOUNTER — Ambulatory Visit: Payer: Self-pay | Admitting: Family Medicine

## 2018-04-23 ENCOUNTER — Ambulatory Visit (INDEPENDENT_AMBULATORY_CARE_PROVIDER_SITE_OTHER): Payer: 59 | Admitting: Family Medicine

## 2018-04-23 ENCOUNTER — Encounter: Payer: Self-pay | Admitting: Family Medicine

## 2018-04-23 VITALS — BP 122/74 | HR 76 | Temp 98.8°F | Resp 16 | Ht 71.0 in | Wt 209.0 lb

## 2018-04-23 DIAGNOSIS — Z125 Encounter for screening for malignant neoplasm of prostate: Secondary | ICD-10-CM

## 2018-04-23 DIAGNOSIS — Z23 Encounter for immunization: Secondary | ICD-10-CM | POA: Diagnosis not present

## 2018-04-23 DIAGNOSIS — E7800 Pure hypercholesterolemia, unspecified: Secondary | ICD-10-CM

## 2018-04-23 DIAGNOSIS — Z0000 Encounter for general adult medical examination without abnormal findings: Secondary | ICD-10-CM | POA: Diagnosis not present

## 2018-04-23 DIAGNOSIS — K429 Umbilical hernia without obstruction or gangrene: Secondary | ICD-10-CM

## 2018-04-23 LAB — POCT URINALYSIS DIPSTICK
BILIRUBIN UA: NEGATIVE
Blood, UA: NEGATIVE
GLUCOSE UA: NEGATIVE
Ketones, UA: NEGATIVE
LEUKOCYTES UA: NEGATIVE
Nitrite, UA: NEGATIVE
PH UA: 6 (ref 5.0–8.0)
Protein, UA: NEGATIVE
Spec Grav, UA: 1.02 (ref 1.010–1.025)
Urobilinogen, UA: 0.2 E.U./dL

## 2018-04-23 NOTE — Progress Notes (Signed)
Patient: Joseph Hendricks, Male    DOB: 06/02/1966, 52 y.o.   MRN: [id number] Visit Date: 04/23/2018  Today's Provider: Wilhemena Durie, MD   Chief Complaint  Patient presents with  . Annual Exam   Subjective:    Annual physical exam Joseph Hendricks is a 52 y.o. male who presents today for health maintenance and complete physical. He feels well. He reports exercising at least 3 days. He reports he is sleeping well. His wife and children are doing well.  Does have increased job stress.  Have some recent knee pain.  He is walking about 2 miles per day.  Colonoscopy- 06/21/2017.    Review of Systems  Constitutional: Negative.   HENT: Negative.   Eyes: Negative.   Respiratory: Negative.   Cardiovascular: Negative.   Gastrointestinal: Negative.   Endocrine: Negative.   Genitourinary: Negative.   Musculoskeletal: Positive for arthralgias and myalgias.  Skin: Negative.   Allergic/Immunologic: Negative.   Neurological: Negative.   Hematological: Negative.   Psychiatric/Behavioral: Negative.     Social History      He  reports that he has never smoked. He has never used smokeless tobacco. He reports that he drinks alcohol. He reports that he does not use drugs.       Social History   Socioeconomic History  . Marital status: Married    Spouse name: Not on file  . Number of children: Not on file  . Years of education: Not on file  . Highest education level: Not on file  Occupational History  . Not on file  Social Needs  . Financial resource strain: Not on file  . Food insecurity:    Worry: Not on file    Inability: Not on file  . Transportation needs:    Medical: Not on file    Non-medical: Not on file  Tobacco Use  . Smoking status: Never Smoker  . Smokeless tobacco: Never Used  Substance and Sexual Activity  . Alcohol use: Yes    Comment: occasionally, 3 drinks a week  . Drug use: No  . Sexual activity: Not on file  Lifestyle  . Physical activity:     Days per week: Not on file    Minutes per session: Not on file  . Stress: Not on file  Relationships  . Social connections:    Talks on phone: Not on file    Gets together: Not on file    Attends religious service: Not on file    Active member of club or organization: Not on file    Attends meetings of clubs or organizations: Not on file    Relationship status: Not on file  Other Topics Concern  . Not on file  Social History Narrative  . Not on file    No past medical history on file.   Patient Active Problem List   Diagnosis Date Noted  . Allergic rhinitis 11/12/2014  . 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 Basal cell carcinoma of skin 11/12/2014  . 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 Chest pain 11/12/2014  . Family history of neurological disease 11/12/2014  . Acid reflux 11/12/2014  . 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 Hypercholesteremia 11/12/2014  . 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 APPENDECTOMY    . HERNIA REPAIR    . KNEE SURGERY      Family History        Family  Status  Relation Name Status  . Mother  Alive  . Father  Deceased  . Sister  Alive  . Brother  Alive  . Brother  Alive        His family history includes Allergies in his brother; Alzheimer's disease in his father; Arthritis in his mother; Cancer in his brother; Diabetes in his father; Healthy in his brother and sister.      No Known Allergies   Current Outpatient Medications:  .  Cetirizine HCl 10 MG CAPS, Take 1 tablet by mouth daily., Disp: , Rfl:  .  Cholecalciferol 1000 UNITS capsule, Take 1 capsule by mouth daily., Disp: , Rfl:  .  lovastatin (MEVACOR) 20 MG tablet, TAKE ONE TABLET BY MOUTH ONCE DAILY AT  BEDTIME, Disp: 90 tablet, Rfl: 3 .  Melatonin 10 MG TABS, Take 1 tablet by mouth at bedtime., Disp: 30 tablet, Rfl: 0 .  Multiple Vitamins-Minerals (MENS MULTIVITAMIN PLUS) TABS, Take 1 tablet by mouth daily.,  Disp: , Rfl:  .  PROAIR HFA 108 (90 Base) MCG/ACT inhaler, INHALE ONE TO TWO PUFFS BY MOUTH 4 TIMES DAILY AS NEEDED, Disp: 9 each, Rfl: 12   Patient Care Team: Jerrol Banana., MD as PCP - General (Family Medicine)      Objective:   Vitals: BP 122/74 (BP Location: Left Arm, Patient Position: Sitting, Cuff Size: Large)   Pulse 76   Temp 98.8 F (37.1 C)   Resp 16   Ht 5\' 11"  (1.803 m)   Wt 209 lb (94.8 kg)   SpO2 96%   BMI 29.15 kg/m    Vitals:   04/23/18 1425  BP: 122/74  Pulse: 76  Resp: 16  Temp: 98.8 F (37.1 C)  SpO2: 96%  Weight: 209 lb (94.8 kg)  Height: 5\' 11"  (1.803 m)     Physical Exam  Constitutional: He is oriented to person, place, and time. He appears well-developed and well-nourished.  HENT:  Head: Normocephalic and atraumatic.  Right Ear: External ear normal.  Left Ear: External ear normal.  Nose: Nose normal.  Mouth/Throat: Oropharynx is clear and moist.  Eyes: Pupils are equal, round, and reactive to light. Conjunctivae and EOM are normal. No scleral icterus.  Neck: No thyromegaly present.  Cardiovascular: Normal rate, regular rhythm and normal heart sounds.  Pulmonary/Chest: Effort normal and breath sounds normal.  Abdominal: Soft.  Patient has a very small, nontender umbilical hernia  Genitourinary: Penis normal.  Musculoskeletal: He exhibits no edema.  Neurological: He is alert and oriented to person, place, and time.  Skin: Skin is warm and dry.  Psychiatric: He has a normal mood and affect. His behavior is normal. Judgment and thought content normal.     Depression Screen PHQ 2/9 Scores 04/23/2018 06/11/2017 12/26/2016  PHQ - 2 Score 0 0 0  PHQ- 9 Score - 5 6      Assessment & Plan:     Routine Health Maintenance and Physical Exam  Exercise Activities and Dietary recommendations Goals   None     Immunization History  Administered Date(s) Administered  . Hepatitis B 01/24/2002  . Influenza,inj,Quad PF,6+ Mos  04/12/2016  . Influenza-Unspecified 04/10/2015  . Pneumococcal Polysaccharide-23 06/09/2014  . Tdap 02/03/2008  . Typhoid Inactivated 01/24/2002    Health Maintenance  Topic Date Due  . HIV Screening  06/07/1979  . TETANUS/TDAP  02/02/2018  . INFLUENZA VACCINE  02/07/2018  . COLONOSCOPY  05/10/2025  . Hepatitis C Screening  Completed  Discussed health benefits of physical activity, and encouraged him to engage in regular exercise appropriate for his age and condition.  Try Tumeric  for knee pain Small umbilical hernia Will follow for now.  I have done the exam and reviewed the above chart and it is accurate to the best of my knowledge. Development worker, community has been used in this note in any air is in the dictation or transcription are unintentional.  Wilhemena Durie, MD  [place]

## 2018-04-30 LAB — COMPREHENSIVE METABOLIC PANEL
A/G RATIO: 1.4 (ref 1.2–2.2)
ALBUMIN: 4.3 g/dL (ref 3.5–5.5)
ALT: 22 IU/L (ref 0–44)
AST: 18 IU/L (ref 0–40)
Alkaline Phosphatase: 46 IU/L (ref 39–117)
BILIRUBIN TOTAL: 0.9 mg/dL (ref 0.0–1.2)
BUN / CREAT RATIO: 10 (ref 9–20)
BUN: 10 mg/dL (ref 6–24)
CALCIUM: 9.1 mg/dL (ref 8.7–10.2)
CHLORIDE: 104 mmol/L (ref 96–106)
CO2: 20 mmol/L (ref 20–29)
Creatinine, Ser: 0.97 mg/dL (ref 0.76–1.27)
GFR, EST AFRICAN AMERICAN: 103 mL/min/{1.73_m2} (ref >59)
GFR, EST NON AFRICAN AMERICAN: 89 mL/min/{1.73_m2} (ref >59)
Globulin, Total: 3 g/dL (ref 1.5–4.5)
Glucose: 101 mg/dL — ABNORMAL HIGH (ref 65–99)
Potassium: 4.3 mmol/L (ref 3.5–5.2)
Sodium: 137 mmol/L (ref 134–144)
TOTAL PROTEIN: 7.3 g/dL (ref 6.0–8.5)

## 2018-04-30 LAB — CBC WITH DIFFERENTIAL/PLATELET
BASOS: 1 %
Basophils Absolute: 0 10*3/uL (ref 0.0–0.2)
EOS (ABSOLUTE): 0.1 10*3/uL (ref 0.0–0.4)
EOS: 3 %
HEMATOCRIT: 42.2 % (ref 37.5–51.0)
HEMOGLOBIN: 14.8 g/dL (ref 13.0–17.7)
IMMATURE GRANS (ABS): 0 10*3/uL (ref 0.0–0.1)
IMMATURE GRANULOCYTES: 0 %
LYMPHS: 21 %
Lymphocytes Absolute: 0.9 10*3/uL (ref 0.7–3.1)
MCH: 32 pg (ref 26.6–33.0)
MCHC: 35.1 g/dL (ref 31.5–35.7)
MCV: 91 fL (ref 79–97)
Monocytes Absolute: 0.4 10*3/uL (ref 0.1–0.9)
Monocytes: 10 %
NEUTROS ABS: 2.9 10*3/uL (ref 1.4–7.0)
NEUTROS PCT: 65 %
PLATELETS: 240 10*3/uL (ref 150–450)
RBC: 4.63 x10E6/uL (ref 4.14–5.80)
RDW: 13.1 % (ref 12.3–15.4)
WBC: 4.3 10*3/uL (ref 3.4–10.8)

## 2018-04-30 LAB — LIPID PANEL
CHOL/HDL RATIO: 4.4 ratio (ref 0.0–5.0)
Cholesterol, Total: 180 mg/dL (ref 100–199)
HDL: 41 mg/dL (ref >39)
LDL CALC: 112 mg/dL — AB (ref 0–99)
TRIGLYCERIDES: 135 mg/dL (ref 0–149)
VLDL Cholesterol Cal: 27 mg/dL (ref 5–40)

## 2018-04-30 LAB — TSH: TSH: 1.52 u[IU]/mL (ref 0.450–4.500)

## 2018-04-30 LAB — PSA: Prostate Specific Ag, Serum: 0.4 ng/mL (ref 0.0–4.0)

## 2018-10-02 ENCOUNTER — Encounter: Payer: Self-pay | Admitting: Family Medicine

## 2018-10-02 ENCOUNTER — Other Ambulatory Visit: Payer: Self-pay

## 2018-10-02 ENCOUNTER — Ambulatory Visit (INDEPENDENT_AMBULATORY_CARE_PROVIDER_SITE_OTHER): Payer: PRIVATE HEALTH INSURANCE | Admitting: Family Medicine

## 2018-10-02 VITALS — BP 122/68 | HR 76 | Temp 99.2°F | Resp 16 | Wt 198.0 lb

## 2018-10-02 DIAGNOSIS — F411 Generalized anxiety disorder: Secondary | ICD-10-CM

## 2018-10-02 DIAGNOSIS — K219 Gastro-esophageal reflux disease without esophagitis: Secondary | ICD-10-CM

## 2018-10-02 DIAGNOSIS — R0789 Other chest pain: Secondary | ICD-10-CM | POA: Diagnosis not present

## 2018-10-02 DIAGNOSIS — G4709 Other insomnia: Secondary | ICD-10-CM

## 2018-10-02 DIAGNOSIS — J302 Other seasonal allergic rhinitis: Secondary | ICD-10-CM | POA: Diagnosis not present

## 2018-10-02 MED ORDER — LORATADINE 10 MG PO TABS: 10.0000 mg | ORAL_TABLET | Freq: Every day | ORAL | 11 refills | Status: DC

## 2018-10-02 MED ORDER — FLUTICASONE PROPIONATE 50 MCG/ACT NA SUSP: 2.0000 | Freq: Every day | NASAL | 6 refills | Status: DC

## 2018-10-02 MED ORDER — OMEPRAZOLE 20 MG PO CPDR: 20.0000 mg | DELAYED_RELEASE_CAPSULE | Freq: Two times a day (BID) | ORAL | 5 refills | Status: DC

## 2018-10-02 NOTE — Progress Notes (Signed)
Patient: Joseph Hendricks Male    DOB: 1965-11-23   53 y.o.   MRN: [id number] Visit Date: 10/02/2018  Today's Provider: Wilhemena Durie, MD   Chief Complaint  Patient presents with  . Gastroesophageal Reflux  . Anxiety   Subjective:     HPI  Patient comes in today concerned that he has lost about 7lbs in the last week. He reports that he has had increased anxiety along with GERD symptoms. Patient reports that he went to [place] about 13 days ago on business. He reports that ever since he has been home, he has had nausea and loss of appetiete. He also mentions having a pain in his chest that radiates to his back that comes and goes. He denies any URI symptoms. Denies fever. He has only taken Tums for his symptoms with mild relief.  He has had no cough and no fever.  He went on a good walk with his wife yesterday and had no chest pain or shortness of breath. 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  He has no problems swallowing but has no appetite.  He denies any depression. Wt Readings from Last 3 Encounters:  10/02/18 198 lb (89.8 kg)  04/23/18 209 lb (94.8 kg)  06/11/17 217 lb 12.8 oz (98.8 kg)   BP Readings from Last 3 Encounters:  10/02/18 122/68  04/23/18 122/74  06/11/17 128/62    No Known Allergies   Current Outpatient Medications:  .  Cetirizine HCl 10 MG CAPS, Take 1 tablet by mouth daily., Disp: , Rfl:  .  Cholecalciferol 1000 UNITS capsule, Take 1 capsule by mouth daily., Disp: , Rfl:  .  lovastatin (MEVACOR) 20 MG tablet, TAKE ONE TABLET BY MOUTH ONCE DAILY AT  BEDTIME, Disp: 90 tablet, Rfl: 3 .  Melatonin 10 MG TABS, Take 1 tablet by mouth at bedtime., Disp: 30 tablet, Rfl: 0 .  Multiple Vitamins-Minerals (MENS MULTIVITAMIN PLUS) TABS, Take 1 tablet by mouth daily., Disp: , Rfl:  .  PROAIR HFA 108 (90 Base) MCG/ACT inhaler, INHALE ONE TO TWO PUFFS BY MOUTH 4  TIMES DAILY AS NEEDED, Disp: 9 each, Rfl: 12  Review of Systems  Constitutional: Positive for activity change, appetite change, fatigue and unexpected weight change. Negative for chills, diaphoresis and fever.  HENT: Negative for congestion, rhinorrhea, sinus pressure, sinus pain, sneezing and sore throat.   Eyes: Negative.   Respiratory: Negative for cough, chest tightness, shortness of breath and wheezing.   Cardiovascular: Positive for chest pain. Negative for palpitations and leg swelling.  Gastrointestinal: Positive for nausea. Negative for abdominal pain, anal bleeding, blood in stool, constipation, diarrhea and vomiting.  Endocrine: Negative.   Musculoskeletal: Negative.   Neurological: Negative for dizziness, light-headedness and headaches.  Psychiatric/Behavioral: Positive for agitation. The patient is nervous/anxious.     Social History   Tobacco Use  . Smoking status: Never Smoker  . Smokeless tobacco: Never Used  Substance Use Topics  . Alcohol use: Yes    Comment: occasionally, 3 drinks a week      Objective:   BP 122/68 (BP Location: Right Arm, Patient Position: Sitting, Cuff Size: Large)   Pulse 76   Temp 99.2 F (37.3 C)   Resp 16   Wt 198 lb (89.8 kg)   SpO2 98%   BMI 27.62 kg/m  Vitals:   10/02/18  1102  BP: 122/68  Pulse: 76  Resp: 16  Temp: 99.2 F (37.3 C)  SpO2: 98%  Weight: 198 lb (89.8 kg)     Physical Exam Vitals signs reviewed.  Constitutional:      Appearance: He is well-developed.  HENT:     Head: Normocephalic and atraumatic.     Right Ear: External ear normal.     Left Ear: External ear normal.     Nose: Nose normal.  Eyes:     General: No scleral icterus.    Conjunctiva/sclera: Conjunctivae normal.     Pupils: Pupils are equal, round, and reactive to light.  Neck:     Thyroid: No thyromegaly.  Cardiovascular:     Rate and Rhythm: Normal rate and regular rhythm.     Heart sounds: Normal heart sounds.  Pulmonary:      Effort: Pulmonary effort is normal.     Breath sounds: Normal breath sounds.  Abdominal:     Palpations: Abdomen is soft.  Genitourinary:    Penis: Normal.   Skin:    General: Skin is warm and dry.  Neurological:     General: No focal deficit present.     Mental Status: He is alert and oriented to person, place, and time. Mental status is at baseline.  Psychiatric:        Mood and Affect: Mood normal.        Behavior: Behavior normal.        Thought Content: Thought content normal.        Judgment: Judgment normal.   ECG is normal sinus rhythm with no ischemic changes.  Unchanged from 2014.      Assessment & Plan    1. Tightness in chest I think this is anxiety related.  Also related to reflux.  I reassured the patient.  Cardiology referral at any point in time if he wishes.  Do not think he needs a chest x-ray or any imaging. - EKG 12-Lead  2. Other insomnia Try zolpidem 10 mg at night for 1 week and recheck.  3. Seasonal allergic rhinitis, unspecified trigger  - fluticasone (FLONASE) 50 MCG/ACT nasal spray; Place 2 sprays into both nostrils daily.  Dispense: 16 g; Refill: 6 - loratadine (CLARITIN) 10 MG tablet; Take 1 tablet (10 mg total) by mouth daily.  Dispense: 30 tablet; Refill: 11  4. Gastroesophageal reflux disease, esophagitis presence not specified Twice daily PPI for now.  Reassess in 1 week.  If he continues to lose weight may need further work-up.  The weight loss is all over his emotional state. - omeprazole (PRILOSEC) 20 MG capsule; Take 1 capsule (20 mg total) by mouth 2 (two) times daily before a meal.  Dispense: 60 capsule; Refill: 5  5. GAD (generalized anxiety disorder) I think this is the biggest reason he is here today.  He is anxious about coronavirus and family and work and coworkers. Recheck 1 week.   I have done the exam and reviewed the above chart and it is accurate to the best of my knowledge. Development worker, community has been used in this note in  any air is in the dictation or transcription are unintentional.  Wilhemena Durie, MD  [place]

## 2018-10-08 ENCOUNTER — Ambulatory Visit (INDEPENDENT_AMBULATORY_CARE_PROVIDER_SITE_OTHER): Payer: PRIVATE HEALTH INSURANCE | Admitting: Family Medicine

## 2018-10-08 ENCOUNTER — Other Ambulatory Visit: Payer: Self-pay

## 2018-10-08 DIAGNOSIS — J4520 Mild intermittent asthma, uncomplicated: Secondary | ICD-10-CM

## 2018-10-08 DIAGNOSIS — R4589 Other symptoms and signs involving emotional state: Secondary | ICD-10-CM | POA: Insufficient documentation

## 2018-10-08 DIAGNOSIS — R0789 Other chest pain: Secondary | ICD-10-CM

## 2018-10-08 DIAGNOSIS — F418 Other specified anxiety disorders: Secondary | ICD-10-CM

## 2018-10-08 DIAGNOSIS — K219 Gastro-esophageal reflux disease without esophagitis: Secondary | ICD-10-CM | POA: Diagnosis not present

## 2018-10-08 NOTE — Progress Notes (Signed)
Patient: Joseph Hendricks Male    DOB: [birth year]   53 y.o.   MRN: [id number] Visit Date: 10/08/2018  Today's Provider: Wilhemena Durie, MD   No chief complaint on file.  Subjective:    Virtual Visit via Video Note  I connected with Joseph Hendricks on 10/08/18 at 10:40 AM EDT by a video enabled telemedicine application and verified that I am speaking with the correct person using two identifiers.   I discussed the limitations of evaluation and management by telemedicine and the availability of in person appointments. The patient expressed understanding and agreed to proceed.  History of Present Illness:  Patient is feeling much better than a week ago.  Within a few days of starting PPI his reflux resolved.  His anxiety is much better he is sleeping much better.  He is having no further chest tightness. He is worried to being 53 years old and having intermittent asthma will cause him to be at increased  risk with COVID-19. I explained that at his true to a degree but if he practices shelter in place and social distancing that he should be fine.  He is also worried about a daughter who is in  medical school. No Known Allergies   Current Outpatient Medications:  .  Cetirizine HCl 10 MG CAPS, Take 1 tablet by mouth daily., Disp: , Rfl:  .  Cholecalciferol 1000 UNITS capsule, Take 1 capsule by mouth daily., Disp: , Rfl:  .  fluticasone (FLONASE) 50 MCG/ACT nasal spray, Place 2 sprays into both nostrils daily., Disp: 16 g, Rfl: 6 .  loratadine (CLARITIN) 10 MG tablet, Take 1 tablet (10 mg total) by mouth daily., Disp: 30 tablet, Rfl: 11 .  lovastatin (MEVACOR) 20 MG tablet, TAKE ONE TABLET BY MOUTH ONCE DAILY AT  BEDTIME, Disp: 90 tablet, Rfl: 3 .  Melatonin 10 MG TABS, Take 1 tablet by mouth at bedtime., Disp: 30 tablet, Rfl: 0 .  Multiple Vitamins-Minerals (MENS MULTIVITAMIN PLUS) TABS, Take 1 tablet by mouth daily., Disp: , Rfl:  .  omeprazole (PRILOSEC) 20 MG capsule, Take 1  capsule (20 mg total) by mouth 2 (two) times daily before a meal., Disp: 60 capsule, Rfl: 5 .  PROAIR HFA 108 (90 Base) MCG/ACT inhaler, INHALE ONE TO TWO PUFFS BY MOUTH 4 TIMES DAILY AS NEEDED, Disp: 9 each, Rfl: 12  Review of Systems  Constitutional: Negative.   Cardiovascular: Negative.   Gastrointestinal: Negative.   Allergic/Immunologic: Negative.   Hematological: Negative.   Psychiatric/Behavioral: Negative.     Social History   Tobacco Use  . Smoking status: Never Smoker  . Smokeless tobacco: Never Used  Substance Use Topics  . Alcohol use: Yes    Comment: occasionally, 3 drinks a week      Objective:   There were no vitals taken for this visit. There were no vitals filed for this visit.   Physical Exam Constitutional:      Appearance: Normal appearance.  HENT:     Head: Normocephalic and atraumatic.     Right Ear: External ear normal.     Left Ear: External ear normal.     Nose: Nose normal.  Pulmonary:     Effort: Pulmonary effort is normal.  Neurological:     Mental Status: He is alert. Mental status is at baseline.     Cranial Nerves: No cranial nerve deficit.  Psychiatric:        Mood and Affect: Mood normal.  Behavior: Behavior normal.        Thought Content: Thought content normal.        Judgment: Judgment normal.         Assessment & Plan     1. Gastroesophageal reflux disease without esophagitis Much improved.  Continue omeprazole twice a day through the first month and then go to daily.  When I see him back we will probably stop this.  2. Other chest pain Completely resolved with treatment of GERD  3. Mild intermittent asthma without complication Controlled on PRN pro-air.  Offered referral to pulmonary at any point in time in the future to evaluate as a month and severity but I think it is very mild.  Presently does not need an inhaled steroid as he has very rare symptoms  4. Anxiety about health This is much improved.  Follow-up  on a as needed basis.  Follow Up Instructions: Continue present meds as above.  Reassured patient.  Encouraged social distancing during coronavirus outbreak.  Follow-up PRN.   I discussed the assessment and treatment plan with the patient. The patient was provided an opportunity to ask questions and all were answered. The patient agreed with the plan and demonstrated an understanding of the instructions.   The patient was advised to call back or seek an in-person evaluation if the symptoms worsen or if the condition fails to improve as anticipated.  I provided 15 minutes of non-face-to-face time during this encounter.     I have done the exam and reviewed the above chart and it is accurate to the best of my knowledge. Development worker, community has been used in this note in any air is in the dictation or transcription are unintentional.  Wilhemena Durie, MD  [place]

## 2018-12-08 ENCOUNTER — Other Ambulatory Visit: Payer: Self-pay | Admitting: Family Medicine

## 2018-12-08 DIAGNOSIS — E785 Hyperlipidemia, unspecified: Secondary | ICD-10-CM

## 2018-12-19 ENCOUNTER — Telehealth: Payer: Self-pay

## 2018-12-19 DIAGNOSIS — K219 Gastro-esophageal reflux disease without esophagitis: Secondary | ICD-10-CM

## 2018-12-19 MED ORDER — OMEPRAZOLE 20 MG PO CPDR: 20.0000 mg | DELAYED_RELEASE_CAPSULE | Freq: Every day | ORAL | 5 refills | Status: DC

## 2018-12-19 NOTE — Telephone Encounter (Signed)
Ok thx.

## 2018-12-19 NOTE — Telephone Encounter (Signed)
Patient states that he needs a new prescription for Omeprazole for once a day instead of twice of day.

## 2018-12-19 NOTE — Addendum Note (Signed)
Addended by: Dan Maker on: 12/19/2018 04:35 PM   Modules accepted: Orders

## 2019-01-16 ENCOUNTER — Telehealth: Payer: Self-pay | Admitting: Physician Assistant

## 2019-01-16 ENCOUNTER — Telehealth (INDEPENDENT_AMBULATORY_CARE_PROVIDER_SITE_OTHER): Payer: Commercial Managed Care - PPO | Admitting: Physician Assistant

## 2019-01-16 DIAGNOSIS — B9789 Other viral agents as the cause of diseases classified elsewhere: Secondary | ICD-10-CM

## 2019-01-16 DIAGNOSIS — R0981 Nasal congestion: Secondary | ICD-10-CM

## 2019-01-16 MED ORDER — IPRATROPIUM BROMIDE 0.03 % NA SOLN: 2.0000 | Freq: Two times a day (BID) | NASAL | 0 refills | Status: DC

## 2019-01-16 NOTE — Progress Notes (Signed)
Patient: Joseph Hendricks Male    DOB: 1966-04-26   53 y.o.   MRN: [id number] Visit Date: 01/16/2019  Today's Provider: Trinna Post, PA-C   Chief Complaint  Patient presents with  . URI   Subjective:    Virtual Visit via Video Note  I connected with ARMANII PRESSNELL on 01/16/19 at  2:40 PM EDT by a video enabled telemedicine application and verified that I am speaking with the correct person using two identifiers.   I discussed the limitations of evaluation and management by telemedicine and the availability of in person appointments. The patient expressed understanding and agreed to proceed.  Patient location: home Provider location: [place] office  Persons involved in the visit: patient, provider    HPI   Upper Respiratory Infection: C/o two days of URI symptoms including dizziness, facial pressure. Has allergies at baseline and takes Zyrtec. Has tied sudafed which helped mildly with light headedness. Sometimes dull headache. Still able to do physical activity. Denies fevers, chills, SOB, nausea, vomiting. 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 Denies large gatherings.    No Known Allergies   Current Outpatient Medications:  .  Cetirizine HCl 10 MG CAPS, Take 1 tablet by mouth daily., Disp: , Rfl:  .  Cholecalciferol 1000 UNITS capsule, Take 1 capsule by mouth daily., Disp: , Rfl:  .  fluticasone (FLONASE) 50 MCG/ACT nasal spray, Place 2 sprays into both nostrils daily., Disp: 16 g, Rfl: 6 .  ipratropium (ATROVENT) 0.03 % nasal spray, Place 2 sprays into both nostrils every 12 (twelve) hours., Disp: 30 mL, Rfl: 0 .  loratadine (CLARITIN) 10 MG tablet, Take 1 tablet (10 mg total) by mouth daily., Disp: 30 tablet, Rfl: 11 .  lovastatin (MEVACOR) 20 MG tablet, TAKE 1 TABLET BY MOUTH ONCE DAILY AT BEDTIME, Disp: 90 tablet, Rfl: 3 .   Melatonin 10 MG TABS, Take 1 tablet by mouth at bedtime., Disp: 30 tablet, Rfl: 0 .  Multiple Vitamins-Minerals (MENS MULTIVITAMIN PLUS) TABS, Take 1 tablet by mouth daily., Disp: , Rfl:  .  omeprazole (PRILOSEC) 20 MG capsule, Take 1 capsule (20 mg total) by mouth daily., Disp: 60 capsule, Rfl: 5 .  PROAIR HFA 108 (90 Base) MCG/ACT inhaler, INHALE ONE TO TWO PUFFS BY MOUTH 4 TIMES DAILY AS NEEDED, Disp: 9 each, Rfl: 12  Review of Systems  Social History   Tobacco Use  . Smoking status: Never Smoker  . Smokeless tobacco: Never Used  Substance Use Topics  . Alcohol use: Yes    Comment: occasionally, 3 drinks a week      Objective:   There were no vitals taken for this visit. There were no vitals filed for this visit.   Physical Exam Constitutional:      Appearance: Normal appearance.  Pulmonary:     Effort: No respiratory distress.  Neurological:     Mental Status: He is alert.      No results found for any visits on 01/16/19.     Assessment & Plan    1. Sinus congestion  He can continue sudafed and start ibuprofen. Possibly viral. Recommend working from home until at least 7 days from start of symptoms and improvement. If not improving, he may call back and we will send augmentin 875-125 mg BID x 10 days.   The entirety of the information  documented in the History of Present Illness, Review of Systems and Physical Exam were personally obtained by me. Portions of this information were initially documented by Lynford Humphrey, CMA and reviewed by me for thoroughness and accuracy.   F/u PRN     Trinna Post, PA-C  [facility]

## 2019-01-16 NOTE — Progress Notes (Signed)
We are sorry that you are not feeling well.  Here is how we plan to help!  Based on what you have shared with me it looks like you have sinusitis.  Sinusitis is inflammation and infection in the sinus cavities of the head.  Based on your presentation I believe you most likely have Acute Viral Sinusitis.This is an infection most likely caused by a virus. There is not specific treatment for viral sinusitis other than to help you with the symptoms until the infection runs its course.  You may use an oral decongestant such as Mucinex D or if you have glaucoma or high blood pressure use plain Mucinex. Saline nasal spray help and can safely be used as often as needed for congestion, I have prescribed: Ipratropium Bromide nasal spray 0.03% 2 sprays in eah nostril 2-3 times a day to use instead of Flonase.  Some authorities believe that zinc sprays or the use of Echinacea may shorten the course of your symptoms.  Sinus infections are not as easily transmitted as other respiratory infection, however we still recommend that you avoid close contact with loved ones, especially the very young and elderly.  Remember to wash your hands thoroughly throughout the day as this is the number one way to prevent the spread of infection!  Home Care:  Only take medications as instructed by your medical team.  Do not take these medications with alcohol.  A steam or ultrasonic humidifier can help congestion.  You can place a towel over your head and breathe in the steam from hot water coming from a faucet.  Avoid close contacts especially the very young and the elderly.  Cover your mouth when you cough or sneeze.  Always remember to wash your hands.  Get Help Right Away If:  You develop worsening fever or sinus pain.  You develop a severe head ache or visual changes.  Your symptoms persist after you have completed your treatment plan.  Make sure you  Understand these instructions.  Will watch your  condition.  Will get help right away if you are not doing well or get worse.  Your e-visit answers were reviewed by a board certified advanced clinical practitioner to complete your personal care plan.  Depending on the condition, your plan could have included both over the counter or prescription medications.  If there is a problem please reply  once you have received a response from your provider.  Your safety is important to [place].  If you have drug allergies check your prescription carefully.    You can use MyChart to ask questions about today's visit, request a non-urgent call back, or ask for a work or school excuse for 24 hours related to this e-Visit. If it has been greater than 24 hours you will need to follow up with your provider, or enter a new e-Visit to address those concerns.  You will get an e-mail in the next two days asking about your experience.  I hope that your e-visit has been valuable and will speed your recovery. Thank you for using e-visits.

## 2019-01-16 NOTE — Progress Notes (Signed)
I have spent 5 minutes in review of e-visit questionnaire, review and updating patient chart, medical decision making and response to patient.   Joseph Hendricks Cody Che Below, PA-C    

## 2019-01-17 NOTE — Patient Instructions (Signed)

## 2019-02-05 ENCOUNTER — Other Ambulatory Visit: Payer: Self-pay

## 2019-02-05 ENCOUNTER — Ambulatory Visit (INDEPENDENT_AMBULATORY_CARE_PROVIDER_SITE_OTHER): Payer: Commercial Managed Care - PPO | Admitting: Family Medicine

## 2019-02-05 DIAGNOSIS — K219 Gastro-esophageal reflux disease without esophagitis: Secondary | ICD-10-CM | POA: Diagnosis not present

## 2019-02-05 DIAGNOSIS — J324 Chronic pansinusitis: Secondary | ICD-10-CM | POA: Diagnosis not present

## 2019-02-05 MED ORDER — PREDNISONE 10 MG (21) PO TBPK: ORAL_TABLET | ORAL | 1 refills | Status: DC

## 2019-02-05 MED ORDER — OMEPRAZOLE 20 MG PO TBEC: 20.0000 mg | DELAYED_RELEASE_TABLET | Freq: Two times a day (BID) | ORAL | 11 refills | Status: DC | PRN

## 2019-02-05 MED ORDER — AMOXICILLIN-POT CLAVULANATE 875-125 MG PO TABS: 1.0000 | ORAL_TABLET | Freq: Two times a day (BID) | ORAL | 0 refills | Status: DC

## 2019-02-05 NOTE — Progress Notes (Signed)
Joseph Hendricks  MRN: [id number] DOB: 03-03-1966  Subjective:  HPI   The patient is a 53 year old male who presents via electronic device with complaint of sinus problems and increased acid reflux.   The patient was treated 2-3 weeks ago for sinus issue with Sudafed and nasal spray.  He states his symptoms started to improve and then about 1 week ago got worse again.  He has been using his inhaler when he hikes and the nasal spray to help symptoms control.  He reports that he walks a few miles every day and has noticed at the end of the [place] it takes him a few minutes to recover.  He is otherwise not short of breath.    The patient also complains of increased symptoms of his acid reflux.  He states that about 1 month ago he was advised to decrease the Omeprazole to 1 daily instead ob BID.  He has had increased indigestion, epigastric pain that radiates into his left shoulder and some nausea that he believes is from his nasal spray.  He states he does sometimes get nauseated when he uses the spray.  Because of the increase in his symptoms the patient has gone back to using the Omeprazole BID as of about 1 week ago and he states he has seen some improvement in the episodes.   Virtual Visit via Video Note  I connected with Joseph QUEBEDEAUX on 02/05/19 at 10:40 AM EDT by a video enabled telemedicine application and verified that I am speaking with the correct person using two identifiers.  Location: Patient: home Provider: [facility]   I discussed the limitations of evaluation and management by telemedicine and the availability of in person appointments. The patient expressed understanding and agreed to proceed.  Patient Active Problem List   Diagnosis Date Noted  . Anxiety about health 10/08/2018  . Allergic rhinitis 11/12/2014  . 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 Basal cell carcinoma of skin 11/12/2014  . 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 Chest pain 11/12/2014  . Family history of  neurological disease 11/12/2014  . Acid reflux 11/12/2014  . 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 Hypercholesteremia 11/12/2014  . 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  Social History   Socioeconomic History  . Marital status: Married    Spouse name: Not on file  . Number of children: Not on file  . Years of education: Not on file  . Highest education level: Not on file  Occupational History  . Not on file  Social Needs  . Financial resource strain: Not on file  . Food insecurity    Worry: Not on file    Inability: Not on file  . Transportation needs    Medical: Not on file    Non-medical: Not on file  Tobacco Use  . Smoking status: Never Smoker  . Smokeless tobacco: Never Used  Substance and Sexual Activity  . Alcohol use: Yes    Comment: occasionally, 3 drinks a week  . Drug use: No  . Sexual activity: Not on file  Lifestyle  . Physical activity    Days per week: Not on file    Minutes per session: Not on file  . Stress: Not on file  Relationships  . Social Herbalist on phone: Not on file    Gets together: Not on file    Attends religious service: Not on file    Active  member of club or organization: Not on file    Attends meetings of clubs or organizations: Not on file    Relationship status: Not on file  . Intimate partner violence    Fear of current or ex partner: Not on file    Emotionally abused: Not on file    Physically abused: Not on file    Forced sexual activity: Not on file  Other Topics Concern  . Not on file  Social History Narrative  . Not on file    Outpatient Encounter Medications as of 02/05/2019  Medication Sig Note  . Cetirizine HCl 10 MG CAPS Take 1 tablet by mouth daily. 11/12/2014: Received from: [facility]:   . Cholecalciferol 1000 UNITS capsule Take 1 capsule by mouth daily. 11/12/2014: Received from: [facility]:   .  ipratropium (ATROVENT) 0.03 % nasal spray Place 2 sprays into both nostrils every 12 (twelve) hours.   . lovastatin (MEVACOR) 20 MG tablet TAKE 1 TABLET BY MOUTH ONCE DAILY AT BEDTIME   . Multiple Vitamins-Minerals (MENS MULTIVITAMIN PLUS) TABS Take 1 tablet by mouth daily. 11/12/2014: Received from: [facility]:   . omeprazole (PRILOSEC) 20 MG capsule Take 1 capsule (20 mg total) by mouth daily.   Marland Kitchen PROAIR HFA 108 (90 Base) MCG/ACT inhaler INHALE ONE TO TWO PUFFS BY MOUTH 4 TIMES DAILY AS NEEDED    No facility-administered encounter medications on file as of 02/05/2019.     No Known Allergies  Review of Systems  Constitutional: Negative for chills, diaphoresis, fever and malaise/fatigue.  HENT: Positive for congestion, ear pain (fullness) and sinus pain (pressure). Negative for sore throat.   Respiratory: Negative for cough, shortness of breath and wheezing.   Cardiovascular: Positive for chest pain (left side up to shoulder. ).  Gastrointestinal: Positive for diarrhea (thinks it is secondary to reaction to his nasal spray) and heartburn. Negative for abdominal pain, constipation, nausea and vomiting.  Musculoskeletal: Positive for back pain (between his shoulder blades). Negative for myalgias.  Neurological: Positive for dizziness.    Objective:  There were no vitals taken for this visit.  Physical Exam  Constitutional: He is well-developed, well-nourished, and in no distress.  HENT:  Head: Normocephalic and atraumatic.  Pulmonary/Chest: Effort normal.  Skin: Skin is warm and dry.  Psychiatric: Mood, memory, affect and judgment normal.    Assessment and Plan :  1. Chronic pansinusitis Augmentin and prednisone taper.  2. Gastroesophageal reflux disease, esophagitis presence not specified Take omeprazole.    I discussed the assessment and treatment plan with the patient. The patient was provided an opportunity to ask questions and all were  answered. The patient agreed with the plan and demonstrated an understanding of the instructions.   The patient was advised to call back or seek an in-person evaluation if the symptoms worsen or if the condition fails to improve as anticipated.  I provided 12 minutes of non-face-to-face time during this encounter.

## 2019-02-13 ENCOUNTER — Other Ambulatory Visit: Payer: Self-pay | Admitting: Family Medicine

## 2019-02-13 MED ORDER — PREDNISONE 10 MG (21) PO TBPK: ORAL_TABLET | ORAL | 1 refills | Status: DC

## 2019-02-13 NOTE — Telephone Encounter (Signed)
Ok to rf once.thanks

## 2019-02-13 NOTE — Telephone Encounter (Signed)
RX sent

## 2019-02-13 NOTE — Telephone Encounter (Signed)
Pt contacted office for refill request on the following medications:  predniSONE (STERAPRED UNI-PAK 21 TAB) 10 MG (21) TBPK tablet  [facility] [place]  Pt stated that he had started feeling much better on Sunday and Monday but after finishing the medication on Monday 02/10/2019 pt started feeling like he was last week. Pt is requesting another round of predniSONE (STERAPRED UNI-PAK 21 TAB) 10 MG (21) TBPK tablet or call back to discuss what else pt can try. Please advise. Thanks TNP

## 2019-02-26 ENCOUNTER — Telehealth: Payer: Self-pay | Admitting: Family Medicine

## 2019-02-26 NOTE — Telephone Encounter (Signed)
Please advise 

## 2019-02-26 NOTE — Telephone Encounter (Signed)
Pt has been having sinus issues off and on for months.  He has done virtual visits with Dr. Darnell Level and the PA.   He has been on prednisone for a couple of weeks.  He said he just finished the prednisone and is having problems with congestion in his ears and sinus area again today.  He has two rounds of prednisone.    Please advise  [phone]  [place]

## 2019-02-27 ENCOUNTER — Other Ambulatory Visit: Payer: Self-pay

## 2019-02-27 DIAGNOSIS — J0191 Acute recurrent sinusitis, unspecified: Secondary | ICD-10-CM

## 2019-02-27 MED ORDER — AMOXICILLIN-POT CLAVULANATE 875-125 MG PO TABS: 1.0000 | ORAL_TABLET | Freq: Two times a day (BID) | ORAL | 0 refills | Status: DC

## 2019-02-27 NOTE — Telephone Encounter (Signed)
Patient notified of medication being prescribed for sinusitis. He agreed. Medication sent to the pharmacy.

## 2019-02-27 NOTE — Telephone Encounter (Signed)
Rx as sinusitis with augmentin for 10 days.

## 2019-04-28 ENCOUNTER — Ambulatory Visit (INDEPENDENT_AMBULATORY_CARE_PROVIDER_SITE_OTHER): Payer: Commercial Managed Care - PPO | Admitting: Family Medicine

## 2019-04-28 ENCOUNTER — Other Ambulatory Visit: Payer: Self-pay

## 2019-04-28 VITALS — BP 122/68 | HR 76 | Temp 97.9°F | Resp 16 | Wt 200.0 lb

## 2019-04-28 DIAGNOSIS — R739 Hyperglycemia, unspecified: Secondary | ICD-10-CM

## 2019-04-28 DIAGNOSIS — E7800 Pure hypercholesterolemia, unspecified: Secondary | ICD-10-CM

## 2019-04-28 DIAGNOSIS — Z125 Encounter for screening for malignant neoplasm of prostate: Secondary | ICD-10-CM | POA: Diagnosis not present

## 2019-04-28 DIAGNOSIS — J4520 Mild intermittent asthma, uncomplicated: Secondary | ICD-10-CM

## 2019-04-28 DIAGNOSIS — Z0000 Encounter for general adult medical examination without abnormal findings: Secondary | ICD-10-CM | POA: Diagnosis not present

## 2019-04-28 DIAGNOSIS — K219 Gastro-esophageal reflux disease without esophagitis: Secondary | ICD-10-CM

## 2019-04-28 DIAGNOSIS — R0981 Nasal congestion: Secondary | ICD-10-CM

## 2019-04-28 DIAGNOSIS — J0191 Acute recurrent sinusitis, unspecified: Secondary | ICD-10-CM

## 2019-04-28 LAB — POCT URINALYSIS DIPSTICK
Bilirubin, UA: NEGATIVE
Blood, UA: NEGATIVE
Glucose, UA: NEGATIVE
Ketones, UA: NEGATIVE
Leukocytes, UA: NEGATIVE
Nitrite, UA: NEGATIVE
Protein, UA: NEGATIVE
Spec Grav, UA: 1.01 (ref 1.010–1.025)
Urobilinogen, UA: 0.2 E.U./dL
pH, UA: 6 (ref 5.0–8.0)

## 2019-04-28 NOTE — Progress Notes (Signed)
Patient: Joseph Hendricks, Male    DOB: [birth year], 53 y.o.   MRN: [id number] Visit Date: 04/28/2019  Today's Provider: Wilhemena Durie, MD   Chief Complaint  Patient presents with  . Annual Exam   Subjective:     Annual physical exam CULVER DRAIN is a 53 y.o. male who presents today for health maintenance and complete physical. He feels well. He reports exercising regularly. He reports he is sleeping well.     Review of Systems  Constitutional: Negative.   HENT: Positive for congestion.   Eyes: Negative.   Respiratory: Negative.   Cardiovascular: Negative.   Gastrointestinal: Negative.   Endocrine: Negative.   Genitourinary: Negative.   Musculoskeletal: Positive for arthralgias and myalgias.  Skin: Negative.   Allergic/Immunologic: Negative.   Neurological: Negative.   Hematological: Negative.   Psychiatric/Behavioral: Negative.     Social History      He  reports that he has never smoked. He has never used smokeless tobacco. He reports current alcohol use. He reports that he does not use drugs.       Social History   Socioeconomic History  . Marital status: Married    Spouse name: Not on file  . Number of children: Not on file  . Years of education: Not on file  . Highest education level: Not on file  Occupational History  . Not on file  Social Needs  . Financial resource strain: Not on file  . Food insecurity    Worry: Not on file    Inability: Not on file  . Transportation needs    Medical: Not on file    Non-medical: Not on file  Tobacco Use  . Smoking status: Never Smoker  . Smokeless tobacco: Never Used  Substance and Sexual Activity  . Alcohol use: Yes    Comment: occasionally, 3 drinks a week  . Drug use: No  . Sexual activity: Not on file  Lifestyle  . Physical activity    Days per week: Not on file    Minutes per session: Not on file  . Stress: Not on file  Relationships  . Social Herbalist on phone: Not on  file    Gets together: Not on file    Attends religious service: Not on file    Active member of club or organization: Not on file    Attends meetings of clubs or organizations: Not on file    Relationship status: Not on file  Other Topics Concern  . Not on file  Social History Narrative  . Not on file    No past medical history on file.   Patient Active Problem List   Diagnosis Date Noted  . Anxiety about health 10/08/2018  . Allergic rhinitis 11/12/2014  . 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 Basal cell carcinoma of skin 11/12/2014  . 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 Chest pain 11/12/2014  . Family history of neurological disease 11/12/2014  . Acid reflux 11/12/2014  . 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 Hypercholesteremia 11/12/2014  . 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 APPENDECTOMY    . HERNIA REPAIR    . KNEE SURGERY      Family History        Family Status  Relation Name Status  . Mother  Alive  . Father  Deceased  . Sister  Alive  . Brother  Alive  .  Brother  Alive        His family history includes Allergies in his brother; Alzheimer's disease in his father; Arthritis in his mother; Cancer in his brother; Diabetes in his father; Healthy in his brother and sister.      No Known Allergies   Current Outpatient Medications:  .  Cetirizine HCl 10 MG CAPS, Take 1 tablet by mouth daily., Disp: , Rfl:  .  Cholecalciferol 1000 UNITS capsule, Take 1 capsule by mouth daily., Disp: , Rfl:  .  ipratropium (ATROVENT) 0.03 % nasal spray, Place 2 sprays into both nostrils every 12 (twelve) hours., Disp: 30 mL, Rfl: 0 .  lovastatin (MEVACOR) 20 MG tablet, TAKE 1 TABLET BY MOUTH ONCE DAILY AT BEDTIME, Disp: 90 tablet, Rfl: 3 .  Multiple Vitamins-Minerals (MENS MULTIVITAMIN PLUS) TABS, Take 1 tablet by mouth daily., Disp: , Rfl:  .  Omeprazole 20 MG TBEC, Take 1 tablet (20 mg total) by mouth 2 (two)  times daily as needed., Disp: 60 tablet, Rfl: 11 .  PROAIR HFA 108 (90 Base) MCG/ACT inhaler, INHALE ONE TO TWO PUFFS BY MOUTH 4 TIMES DAILY AS NEEDED, Disp: 9 each, Rfl: 12 .  amoxicillin-clavulanate (AUGMENTIN) 875-125 MG tablet, Take 1 tablet by mouth 2 (two) times daily., Disp: 20 tablet, Rfl: 0 .  amoxicillin-clavulanate (AUGMENTIN) 875-125 MG tablet, Take 1 tablet by mouth 2 (two) times daily., Disp: 20 tablet, Rfl: 0 .  predniSONE (STERAPRED UNI-PAK 21 TAB) 10 MG (21) TBPK tablet, 6-5-4-3-2-1 (Patient not taking: Reported on 04/28/2019), Disp: 21 tablet, Rfl: 1   Patient Care Team: Jerrol Banana., MD as PCP - General (Family Medicine)    Objective:    Vitals: BP 122/68   Pulse 76   Temp 97.9 F (36.6 C)   Resp 16   Wt 200 lb (90.7 kg)   SpO2 99%   BMI 27.89 kg/m    Vitals:   04/28/19 1429  BP: 122/68  Pulse: 76  Resp: 16  Temp: 97.9 F (36.6 C)  SpO2: 99%  Weight: 200 lb (90.7 kg)     Physical Exam Vitals signs reviewed.  Constitutional:      Appearance: He is well-developed.  HENT:     Head: Normocephalic and atraumatic.     Right Ear: External ear normal.     Left Ear: External ear normal.     Nose: Nose normal.  Eyes:     General: No scleral icterus.    Conjunctiva/sclera: Conjunctivae normal.     Pupils: Pupils are equal, round, and reactive to light.  Neck:     Thyroid: No thyromegaly.  Cardiovascular:     Rate and Rhythm: Normal rate and regular rhythm.     Heart sounds: Normal heart sounds.  Pulmonary:     Effort: Pulmonary effort is normal.     Breath sounds: Normal breath sounds.  Abdominal:     Palpations: Abdomen is soft.     Comments: Patient has a very small, nontender umbilical hernia  Genitourinary:    Penis: Normal.   Skin:    General: Skin is warm and dry.  Neurological:     Mental Status: He is alert and oriented to person, place, and time.  Psychiatric:        Behavior: Behavior normal.        Thought Content: Thought  content normal.        Judgment: Judgment normal.      Depression Screen PHQ 2/9 Scores 04/28/2019 04/28/2019  04/23/2018 06/11/2017  PHQ - 2 Score 0 0 0 0  PHQ- 9 Score 0 - - 5       Assessment & Plan:     Routine Health Maintenance and Physical Exam  Exercise Activities and Dietary recommendations Goals   None     Immunization History  Administered Date(s) Administered  . Hepatitis B 01/24/2002  . Influenza,inj,Quad PF,6+ Mos 04/12/2016  . Influenza-Unspecified 04/10/2015  . Pneumococcal Polysaccharide-23 06/09/2014  . Td 04/23/2018  . Tdap 02/03/2008  . Typhoid Inactivated 01/24/2002    Health Maintenance  Topic Date Due  . HIV Screening  06/07/1979  . INFLUENZA VACCINE  02/08/2019  . COLONOSCOPY  05/10/2025  . TETANUS/TDAP  04/23/2028  . Hepatitis C Screening  Completed     Discussed health benefits of physical activity, and encouraged him to engage in regular exercise appropriate for his age and condition.    1. Annual physical exam - CBC with Differential/Platelet - Comprehensive metabolic panel - Lipid Profile - TSH - POCT Urinalysis Dipstick Colon 2028  2. Prostate cancer screening - PSA  3. Hyperglycemia - HgB A1c  5. Joint Pain Try OTC Turmeric daily.  6. Chronic Sinus Infection On Zyrtec and flonase. - ambulatory referral to ENT  Follow up in 1 year for CPE.     Gayl Ivanoff Cranford Mon, MD  [facility]

## 2019-04-30 LAB — COMPREHENSIVE METABOLIC PANEL
ALT: 19 IU/L (ref 0–44)
AST: 17 IU/L (ref 0–40)
Albumin/Globulin Ratio: 1.5 (ref 1.2–2.2)
Albumin: 4.4 g/dL (ref 3.8–4.9)
Alkaline Phosphatase: 51 IU/L (ref 39–117)
BUN/Creatinine Ratio: 14 (ref 9–20)
BUN: 12 mg/dL (ref 6–24)
Bilirubin Total: 1.3 mg/dL — ABNORMAL HIGH (ref 0.0–1.2)
CO2: 24 mmol/L (ref 20–29)
Calcium: 9.4 mg/dL (ref 8.7–10.2)
Chloride: 104 mmol/L (ref 96–106)
Creatinine, Ser: 0.87 mg/dL (ref 0.76–1.27)
GFR calc Af Amer: 113 mL/min/{1.73_m2} (ref >59)
GFR calc non Af Amer: 98 mL/min/{1.73_m2} (ref >59)
Globulin, Total: 3 g/dL (ref 1.5–4.5)
Glucose: 101 mg/dL — ABNORMAL HIGH (ref 65–99)
Potassium: 4.3 mmol/L (ref 3.5–5.2)
Sodium: 140 mmol/L (ref 134–144)
Total Protein: 7.4 g/dL (ref 6.0–8.5)

## 2019-04-30 LAB — CBC WITH DIFFERENTIAL/PLATELET
Basophils Absolute: 0 10*3/uL (ref 0.0–0.2)
Basos: 1 %
EOS (ABSOLUTE): 0.2 10*3/uL (ref 0.0–0.4)
Eos: 3 %
Hematocrit: 41.5 % (ref 37.5–51.0)
Hemoglobin: 14.4 g/dL (ref 13.0–17.7)
Immature Grans (Abs): 0 10*3/uL (ref 0.0–0.1)
Immature Granulocytes: 0 %
Lymphocytes Absolute: 1.1 10*3/uL (ref 0.7–3.1)
Lymphs: 24 %
MCH: 32.6 pg (ref 26.6–33.0)
MCHC: 34.7 g/dL (ref 31.5–35.7)
MCV: 94 fL (ref 79–97)
Monocytes Absolute: 0.6 10*3/uL (ref 0.1–0.9)
Monocytes: 12 %
Neutrophils Absolute: 2.9 10*3/uL (ref 1.4–7.0)
Neutrophils: 60 %
Platelets: 254 10*3/uL (ref 150–450)
RBC: 4.42 x10E6/uL (ref 4.14–5.80)
RDW: 12.5 % (ref 11.6–15.4)
WBC: 4.7 10*3/uL (ref 3.4–10.8)

## 2019-04-30 LAB — PSA: Prostate Specific Ag, Serum: 0.4 ng/mL (ref 0.0–4.0)

## 2019-04-30 LAB — LIPID PANEL
Chol/HDL Ratio: 3.6 ratio (ref 0.0–5.0)
Cholesterol, Total: 181 mg/dL (ref 100–199)
HDL: 50 mg/dL (ref >39)
LDL Chol Calc (NIH): 115 mg/dL — ABNORMAL HIGH (ref 0–99)
Triglycerides: 87 mg/dL (ref 0–149)
VLDL Cholesterol Cal: 16 mg/dL (ref 5–40)

## 2019-04-30 LAB — TSH: TSH: 1.54 u[IU]/mL (ref 0.450–4.500)

## 2019-04-30 LAB — HEMOGLOBIN A1C
Est. average glucose Bld gHb Est-mCnc: 103 mg/dL
Hgb A1c MFr Bld: 5.2 % (ref 4.8–5.6)

## 2019-08-14 ENCOUNTER — Other Ambulatory Visit: Payer: Self-pay | Admitting: *Deleted

## 2019-08-14 NOTE — Telephone Encounter (Signed)
Error

## 2019-10-27 ENCOUNTER — Telehealth: Payer: Self-pay

## 2019-10-27 NOTE — Telephone Encounter (Signed)
Copied from [place] [id number]. Topic: General - Inquiry >> Oct 27, 2019  1:44 PM Richardo Priest, [place] wrote: Reason for CRM: Patient called in stating he will have [facility] fax over his record of him receiving the COVID-19 vaccine, via fax. Please advise.

## 2019-10-27 NOTE — Telephone Encounter (Signed)
Noted  

## 2019-10-29 ENCOUNTER — Other Ambulatory Visit: Payer: Self-pay | Admitting: Family Medicine

## 2019-10-29 DIAGNOSIS — J302 Other seasonal allergic rhinitis: Secondary | ICD-10-CM

## 2019-10-29 MED ORDER — FLUTICASONE PROPIONATE 50 MCG/ACT NA SUSP: 2.0000 | Freq: Every day | NASAL | 6 refills | Status: DC

## 2019-10-29 NOTE — Telephone Encounter (Signed)
[facility] faxed refill request for the following medications:  fluticasone (FLONASE) 50 MCG/ACT nasal spray  Last Rx: 10/02/2018 LOV: 04/28/2019 NOV: 04/28/2020 Please advise. Thanks TNP

## 2020-03-08 ENCOUNTER — Other Ambulatory Visit: Payer: Self-pay | Admitting: Family Medicine

## 2020-03-08 DIAGNOSIS — E785 Hyperlipidemia, unspecified: Secondary | ICD-10-CM

## 2020-03-08 NOTE — Telephone Encounter (Signed)
Requested Prescriptions  Pending Prescriptions Disp Refills   lovastatin (MEVACOR) 20 MG tablet [Pharmacy Med Name: Lovastatin 20 MG Oral Tablet] 90 tablet 0    Sig: TAKE 1 TABLET BY MOUTH AT BEDTIME     Cardiovascular:  Antilipid - Statins Failed - 03/08/2020 10:30 AM      Failed - LDL in normal range and within 360 days    LDL Chol Calc (NIH)  Date Value Ref Range Status  04/29/2019 115 (H) 0 - 99 mg/dL Final         Passed - Total Cholesterol in normal range and within 360 days    Cholesterol, Total  Date Value Ref Range Status  04/29/2019 181 100 - 199 mg/dL Final         Passed - HDL in normal range and within 360 days    HDL  Date Value Ref Range Status  04/29/2019 50 >39 mg/dL Final         Passed - Triglycerides in normal range and within 360 days    Triglycerides  Date Value Ref Range Status  04/29/2019 87 0 - 149 mg/dL Final         Passed - Patient is not pregnant      Passed - Valid encounter within last 12 months    Recent Outpatient Visits          10 months ago Annual physical exam   [facility] Jerrol Banana., MD   1 year ago Chronic pansinusitis   [facility] Jerrol Banana., MD   1 year ago Sinus congestion   [facility], [place], [place]   1 year ago Gastroesophageal reflux disease without esophagitis   [facility] Jerrol Banana., MD   1 year ago Tightness in chest   [facility] Jerrol Banana., MD      Future Appointments            In 1 month Jerrol Banana., MD [facility], [place]

## 2020-04-28 ENCOUNTER — Encounter: Payer: Self-pay | Admitting: Family Medicine

## 2020-04-28 ENCOUNTER — Other Ambulatory Visit: Payer: Self-pay

## 2020-04-28 ENCOUNTER — Ambulatory Visit (INDEPENDENT_AMBULATORY_CARE_PROVIDER_SITE_OTHER): Payer: BC Managed Care – PPO | Admitting: Family Medicine

## 2020-04-28 VITALS — BP 133/79 | HR 75 | Temp 98.3°F | Ht 71.0 in | Wt 199.6 lb

## 2020-04-28 DIAGNOSIS — Z1211 Encounter for screening for malignant neoplasm of colon: Secondary | ICD-10-CM

## 2020-04-28 DIAGNOSIS — E7800 Pure hypercholesterolemia, unspecified: Secondary | ICD-10-CM | POA: Diagnosis not present

## 2020-04-28 DIAGNOSIS — R739 Hyperglycemia, unspecified: Secondary | ICD-10-CM | POA: Diagnosis not present

## 2020-04-28 DIAGNOSIS — Z125 Encounter for screening for malignant neoplasm of prostate: Secondary | ICD-10-CM | POA: Diagnosis not present

## 2020-04-28 DIAGNOSIS — Z23 Encounter for immunization: Secondary | ICD-10-CM | POA: Diagnosis not present

## 2020-04-28 DIAGNOSIS — Z114 Encounter for screening for human immunodeficiency virus [HIV]: Secondary | ICD-10-CM

## 2020-04-28 DIAGNOSIS — Z0000 Encounter for general adult medical examination without abnormal findings: Secondary | ICD-10-CM | POA: Diagnosis not present

## 2020-04-28 LAB — IFOBT (OCCULT BLOOD): IFOBT: NEGATIVE

## 2020-04-28 NOTE — Progress Notes (Signed)
Complete physical exam   Patient: Joseph Hendricks   DOB: 07/10/1966   54 y.o. Male  MRN: [id number] Visit Date: 04/28/2020  Today's healthcare provider: Wilhemena Durie, MD   Chief Complaint  Patient presents with  . Annual Exam   Subjective    Joseph Hendricks is a 54 y.o. male who presents today for a complete physical exam.  He reports consuming a general diet. Home exercise routine includes walking 2-2.5 miles 5 days per week. He generally feels well. He reports sleeping poorly. He does not have additional problems to discuss today.  He is married and the father of 3 children.  He is now functional CFO of multiple companies. HPI    History reviewed. No pertinent past medical history. Past Surgical History:  Procedure Laterality Date  . APPENDECTOMY    . HERNIA REPAIR    . KNEE SURGERY     Social History   Socioeconomic History  . Marital status: Married    Spouse name: Not on file  . Number of children: Not on file  . Years of education: Not on file  . Highest education level: Not on file  Occupational History  . Not on file  Tobacco Use  . Smoking status: Never Smoker  . Smokeless tobacco: Never Used  Vaping Use  . Vaping Use: Never used  Substance and Sexual Activity  . Alcohol use: Yes    Comment: occasionally, 3 drinks a week  . Drug use: No  . Sexual activity: Not on file  Other Topics Concern  . Not on file  Social History Narrative  . Not on file   Social Determinants of Health   Financial Resource Strain:   . Difficulty of Paying Living Expenses: Not on file  Food Insecurity:   . Worried About Charity fundraiser in the Last Year: Not on file  . Ran Out of Food in the Last Year: Not on file  Transportation Needs:   . Lack of Transportation (Medical): Not on file  . Lack of Transportation (Non-Medical): Not on file  Physical Activity:   . Days of Exercise per Week: Not on file  . Minutes of Exercise per Session: Not on file  Stress:    . Feeling of Stress : Not on file  Social Connections:   . Frequency of Communication with Friends and Family: Not on file  . Frequency of Social Gatherings with Friends and Family: Not on file  . Attends Religious Services: Not on file  . Active Member of Clubs or Organizations: Not on file  . Attends Archivist Meetings: Not on file  . Marital Status: Not on file  Intimate Partner Violence:   . Fear of Current or Ex-Partner: Not on file  . Emotionally Abused: Not on file  . Physically Abused: Not on file  . Sexually Abused: Not on file   Family Status  Relation Name Status  . Mother  Alive  . Father  Deceased  . Sister  Alive  . Brother  Alive  . Brother  Alive   Family History  Problem Relation Age of Onset  . Arthritis Mother        osteo  . Diabetes Father   . Alzheimer's disease Father   . Healthy Sister   . Allergies Brother   . Cancer Brother        testicular  . Healthy Brother    No Known Allergies  Patient Care Team:  Jerrol Banana., MD as PCP - General (Family Medicine)   Medications: Outpatient Medications Prior to Visit  Medication Sig  . azelastine (ASTELIN) 0.1 % nasal spray Place 2 sprays into both nostrils 2 (two) times daily. Use in each nostril as directed  . fluticasone (FLONASE) 50 MCG/ACT nasal spray Place 2 sprays into both nostrils daily.  Marland Kitchen lovastatin (MEVACOR) 20 MG tablet TAKE 1 TABLET BY MOUTH AT BEDTIME  . Multiple Vitamins-Minerals (MENS MULTIVITAMIN PLUS) TABS Take 1 tablet by mouth daily.  . Omeprazole 20 MG TBEC Take 1 tablet (20 mg total) by mouth 2 (two) times daily as needed.  . [DISCONTINUED] amoxicillin-clavulanate (AUGMENTIN) 875-125 MG tablet Take 1 tablet by mouth 2 (two) times daily.  . [DISCONTINUED] amoxicillin-clavulanate (AUGMENTIN) 875-125 MG tablet Take 1 tablet by mouth 2 (two) times daily.  . [DISCONTINUED] Cetirizine HCl 10 MG CAPS Take 1 tablet by mouth daily.  . [DISCONTINUED] Cholecalciferol  1000 UNITS capsule Take 1 capsule by mouth daily.  . [DISCONTINUED] ipratropium (ATROVENT) 0.03 % nasal spray Place 2 sprays into both nostrils every 12 (twelve) hours.  . [DISCONTINUED] predniSONE (STERAPRED UNI-PAK 21 TAB) 10 MG (21) TBPK tablet 6-5-4-3-2-1 (Patient not taking: Reported on 04/28/2019)  . [DISCONTINUED] PROAIR HFA 108 (90 Base) MCG/ACT inhaler INHALE ONE TO TWO PUFFS BY MOUTH 4 TIMES DAILY AS NEEDED   No facility-administered medications prior to visit.    Review of Systems  Constitutional: Negative.   HENT: Positive for sinus pressure.   Eyes: Negative.   Respiratory: Negative.   Cardiovascular: Negative.   Gastrointestinal: Negative.   Endocrine: Negative.   Genitourinary: Negative.   Musculoskeletal: Positive for arthralgias.  Skin: Negative.   Allergic/Immunologic: Positive for environmental allergies.  Neurological: Negative.   Hematological: Negative.   Psychiatric/Behavioral: Positive for sleep disturbance.       Objective    BP 133/79 (BP Location: Left Arm, Patient Position: Sitting, Cuff Size: Normal)   Pulse 75   Temp 98.3 F (36.8 C) (Oral)   Ht 5\' 11"  (1.803 m)   Wt 199 lb 9.6 oz (90.5 kg)   SpO2 99%   BMI 27.84 kg/m  BP Readings from Last 3 Encounters:  04/28/20 133/79  04/28/19 122/68  10/02/18 122/68   Wt Readings from Last 3 Encounters:  04/28/20 199 lb 9.6 oz (90.5 kg)  04/28/19 200 lb (90.7 kg)  10/02/18 198 lb (89.8 kg)      Physical Exam Vitals reviewed.  Constitutional:      Appearance: He is well-developed.  HENT:     Head: Normocephalic and atraumatic.     Right Ear: External ear normal.     Left Ear: External ear normal.     Nose: Nose normal.  Eyes:     General: No scleral icterus.    Conjunctiva/sclera: Conjunctivae normal.     Pupils: Pupils are equal, round, and reactive to light.  Neck:     Thyroid: No thyromegaly.  Cardiovascular:     Rate and Rhythm: Normal rate and regular rhythm.     Heart sounds:  Normal heart sounds.  Pulmonary:     Effort: Pulmonary effort is normal.     Breath sounds: Normal breath sounds.  Abdominal:     Palpations: Abdomen is soft.     Comments: Patient has a very small, nontender umbilical hernia  Genitourinary:    Penis: Normal.      Testes: Normal.     Prostate: Normal.     Rectum: Normal.  Skin:    General: Skin is warm and dry.  Neurological:     General: No focal deficit present.     Mental Status: He is alert and oriented to person, place, and time.  Psychiatric:        Behavior: Behavior normal.        Thought Content: Thought content normal.        Judgment: Judgment normal.       Last depression screening scores PHQ 2/9 Scores 04/28/2019 04/28/2019 04/23/2018  PHQ - 2 Score 0 0 0  PHQ- 9 Score 0 - -   Last fall risk screening Fall Risk  04/28/2019  Falls in the past year? 0  Number falls in past yr: 0  Injury with Fall? 0  Follow up Falls evaluation completed   Last Audit-C alcohol use screening Alcohol Use Disorder Test (AUDIT) 04/28/2019  1. How often do you have a drink containing alcohol? 3  2. How many drinks containing alcohol do you have on a typical day when you are drinking? 0  3. How often do you have six or more drinks on one occasion? 0  AUDIT-C Score 3  4. How often during the last year have you found that you were not able to stop drinking once you had started? 0  5. How often during the last year have you failed to do what was normally expected from you because of drinking? 0  6. How often during the last year have you needed a first drink in the morning to get yourself going after a heavy drinking session? 0  7. How often during the last year have you had a feeling of guilt of remorse after drinking? 0  8. How often during the last year have you been unable to remember what happened the night before because you had been drinking? 0  9. Have you or someone else been injured as a result of your drinking? 0  10. Has  a relative or friend or a doctor or another health worker been concerned about your drinking or suggested you cut down? 0  Alcohol Use Disorder Identification Test Final Score (AUDIT) 3  Alcohol Brief Interventions/Follow-up AUDIT Score <7 follow-up not indicated   A score of 3 or more in women, and 4 or more in men indicates increased risk for alcohol abuse, EXCEPT if all of the points are from question 1   No results found for any visits on 04/28/20.  Assessment & Plan    Routine Health Maintenance and Physical Exam  Exercise Activities and Dietary recommendations Goals   None     Immunization History  Administered Date(s) Administered  . Hepatitis B 01/24/2002  . Influenza,inj,Quad PF,6+ Mos 04/12/2016  . Influenza-Unspecified 04/10/2015  . Pneumococcal Polysaccharide-23 06/09/2014  . Td 04/23/2018  . Tdap 02/03/2008  . Typhoid Inactivated 01/24/2002    Health Maintenance  Topic Date Due  . COVID-19 Vaccine (1) Never done  . HIV Screening  Never done  . INFLUENZA VACCINE  02/08/2020  . COLONOSCOPY  05/10/2025  . TETANUS/TDAP  04/23/2028  . Hepatitis C Screening  Completed    Discussed health benefits of physical activity, and encouraged him to engage in regular exercise appropriate for his age and condition.  1. Annual physical exam   2. Prostate cancer screening  - PSA  3. Hypercholesteremia  - CBC with Differential - Comprehensive Metabolic Panel (CMET) - TSH - Lipid Profile  4. Hyperglycemia  - CBC with Differential - Comprehensive  Metabolic Panel (CMET) - TSH - HgB A1c  5. Encounter for screening for HIV  - HIV Antibody (routine testing w rflx)  6. Colon cancer screening Negative - IFOBT POC (occult bld, rslt in office); Future - IFOBT POC (occult bld, rslt in office) 7.Insomna Try melatonin at bedtime  No follow-ups on file.        Meilin Brosh Cranford Mon, MD  [facility] [phone] (phone) [phone] (fax)  [place]

## 2020-04-29 DIAGNOSIS — Z114 Encounter for screening for human immunodeficiency virus [HIV]: Secondary | ICD-10-CM | POA: Diagnosis not present

## 2020-04-29 DIAGNOSIS — R739 Hyperglycemia, unspecified: Secondary | ICD-10-CM | POA: Diagnosis not present

## 2020-04-29 DIAGNOSIS — E7800 Pure hypercholesterolemia, unspecified: Secondary | ICD-10-CM | POA: Diagnosis not present

## 2020-04-29 DIAGNOSIS — Z125 Encounter for screening for malignant neoplasm of prostate: Secondary | ICD-10-CM | POA: Diagnosis not present

## 2020-04-30 LAB — CBC WITH DIFFERENTIAL/PLATELET
Basophils Absolute: 0 10*3/uL (ref 0.0–0.2)
Basos: 1 %
EOS (ABSOLUTE): 0 10*3/uL (ref 0.0–0.4)
Eos: 1 %
Hematocrit: 43.7 % (ref 37.5–51.0)
Hemoglobin: 14.7 g/dL (ref 13.0–17.7)
Immature Grans (Abs): 0 10*3/uL (ref 0.0–0.1)
Immature Granulocytes: 0 %
Lymphocytes Absolute: 0.9 10*3/uL (ref 0.7–3.1)
Lymphs: 15 %
MCH: 32.3 pg (ref 26.6–33.0)
MCHC: 33.6 g/dL (ref 31.5–35.7)
MCV: 96 fL (ref 79–97)
Monocytes Absolute: 0.6 10*3/uL (ref 0.1–0.9)
Monocytes: 10 %
Neutrophils Absolute: 4.3 10*3/uL (ref 1.4–7.0)
Neutrophils: 73 %
Platelets: 241 10*3/uL (ref 150–450)
RBC: 4.55 x10E6/uL (ref 4.14–5.80)
RDW: 11.9 % (ref 11.6–15.4)
WBC: 5.8 10*3/uL (ref 3.4–10.8)

## 2020-04-30 LAB — COMPREHENSIVE METABOLIC PANEL
ALT: 15 IU/L (ref 0–44)
AST: 17 IU/L (ref 0–40)
Albumin/Globulin Ratio: 1.5 (ref 1.2–2.2)
Albumin: 4.5 g/dL (ref 3.8–4.9)
Alkaline Phosphatase: 51 IU/L (ref 44–121)
BUN/Creatinine Ratio: 13 (ref 9–20)
BUN: 13 mg/dL (ref 6–24)
Bilirubin Total: 1.4 mg/dL — ABNORMAL HIGH (ref 0.0–1.2)
CO2: 21 mmol/L (ref 20–29)
Calcium: 9.8 mg/dL (ref 8.7–10.2)
Chloride: 103 mmol/L (ref 96–106)
Creatinine, Ser: 0.97 mg/dL (ref 0.76–1.27)
GFR calc Af Amer: 101 mL/min/{1.73_m2} (ref >59)
GFR calc non Af Amer: 88 mL/min/{1.73_m2} (ref >59)
Globulin, Total: 3 g/dL (ref 1.5–4.5)
Glucose: 111 mg/dL — ABNORMAL HIGH (ref 65–99)
Potassium: 4.5 mmol/L (ref 3.5–5.2)
Sodium: 141 mmol/L (ref 134–144)
Total Protein: 7.5 g/dL (ref 6.0–8.5)

## 2020-04-30 LAB — LIPID PANEL
Chol/HDL Ratio: 3.9 ratio (ref 0.0–5.0)
Cholesterol, Total: 209 mg/dL — ABNORMAL HIGH (ref 100–199)
HDL: 54 mg/dL (ref >39)
LDL Chol Calc (NIH): 135 mg/dL — ABNORMAL HIGH (ref 0–99)
Triglycerides: 114 mg/dL (ref 0–149)
VLDL Cholesterol Cal: 20 mg/dL (ref 5–40)

## 2020-04-30 LAB — HIV ANTIBODY (ROUTINE TESTING W REFLEX): HIV Screen 4th Generation wRfx: NONREACTIVE

## 2020-04-30 LAB — HEMOGLOBIN A1C
Est. average glucose Bld gHb Est-mCnc: 105 mg/dL
Hgb A1c MFr Bld: 5.3 % (ref 4.8–5.6)

## 2020-04-30 LAB — TSH: TSH: 1.1 u[IU]/mL (ref 0.450–4.500)

## 2020-04-30 LAB — PSA: Prostate Specific Ag, Serum: 0.4 ng/mL (ref 0.0–4.0)

## 2020-07-14 ENCOUNTER — Other Ambulatory Visit: Payer: Self-pay | Admitting: Family Medicine

## 2020-07-14 DIAGNOSIS — J302 Other seasonal allergic rhinitis: Secondary | ICD-10-CM

## 2020-08-01 ENCOUNTER — Other Ambulatory Visit: Payer: Self-pay | Admitting: Family Medicine

## 2020-08-01 DIAGNOSIS — E785 Hyperlipidemia, unspecified: Secondary | ICD-10-CM

## 2020-08-01 NOTE — Telephone Encounter (Signed)
Requested Prescriptions  Pending Prescriptions Disp Refills  . lovastatin (MEVACOR) 20 MG tablet [Pharmacy Med Name: Lovastatin 20 MG Oral Tablet] 90 tablet 2    Sig: TAKE 1 TABLET BY MOUTH AT BEDTIME     Cardiovascular:  Antilipid - Statins Failed - 08/01/2020  3:43 PM      Failed - Total Cholesterol in normal range and within 360 days    Cholesterol, Total  Date Value Ref Range Status  04/29/2020 209 (H) 100 - 199 mg/dL Final         Failed - LDL in normal range and within 360 days    LDL Chol Calc (NIH)  Date Value Ref Range Status  04/29/2020 135 (H) 0 - 99 mg/dL Final         Passed - HDL in normal range and within 360 days    HDL  Date Value Ref Range Status  04/29/2020 54 >39 mg/dL Final         Passed - Triglycerides in normal range and within 360 days    Triglycerides  Date Value Ref Range Status  04/29/2020 114 0 - 149 mg/dL Final         Passed - Patient is not pregnant      Passed - Valid encounter within last 12 months    Recent Outpatient Visits          3 months ago Annual physical exam   [facility] Jerrol Banana., MD   1 year ago Annual physical exam   [facility] Jerrol Banana., MD   1 year ago Chronic pansinusitis   [facility] Jerrol Banana., MD   1 year ago Sinus congestion   [facility], [place]   1 year ago Gastroesophageal reflux disease without esophagitis   [facility] Jerrol Banana., MD      Future Appointments            In 9 months Jerrol Banana., MD [facility], [place]

## 2020-08-24 DIAGNOSIS — D2261 Melanocytic nevi of right upper limb, including shoulder: Secondary | ICD-10-CM | POA: Diagnosis not present

## 2020-08-24 DIAGNOSIS — Z85828 Personal history of other malignant neoplasm of skin: Secondary | ICD-10-CM | POA: Diagnosis not present

## 2020-08-24 DIAGNOSIS — D2262 Melanocytic nevi of left upper limb, including shoulder: Secondary | ICD-10-CM | POA: Diagnosis not present

## 2020-08-24 DIAGNOSIS — D2271 Melanocytic nevi of right lower limb, including hip: Secondary | ICD-10-CM | POA: Diagnosis not present

## 2020-08-24 DIAGNOSIS — X32XXXA Exposure to sunlight, initial encounter: Secondary | ICD-10-CM | POA: Diagnosis not present

## 2020-08-24 DIAGNOSIS — L570 Actinic keratosis: Secondary | ICD-10-CM | POA: Diagnosis not present

## 2021-05-04 ENCOUNTER — Other Ambulatory Visit: Payer: Self-pay

## 2021-05-04 ENCOUNTER — Ambulatory Visit (INDEPENDENT_AMBULATORY_CARE_PROVIDER_SITE_OTHER): Payer: BC Managed Care – PPO | Admitting: Family Medicine

## 2021-05-04 VITALS — BP 110/76 | HR 74 | Temp 98.6°F | Wt 189.0 lb

## 2021-05-04 DIAGNOSIS — Z23 Encounter for immunization: Secondary | ICD-10-CM

## 2021-05-04 DIAGNOSIS — E7800 Pure hypercholesterolemia, unspecified: Secondary | ICD-10-CM

## 2021-05-04 DIAGNOSIS — J302 Other seasonal allergic rhinitis: Secondary | ICD-10-CM | POA: Diagnosis not present

## 2021-05-04 DIAGNOSIS — M79642 Pain in left hand: Secondary | ICD-10-CM

## 2021-05-04 DIAGNOSIS — Z0000 Encounter for general adult medical examination without abnormal findings: Secondary | ICD-10-CM | POA: Diagnosis not present

## 2021-05-04 DIAGNOSIS — R739 Hyperglycemia, unspecified: Secondary | ICD-10-CM | POA: Diagnosis not present

## 2021-05-04 DIAGNOSIS — M79641 Pain in right hand: Secondary | ICD-10-CM

## 2021-05-04 DIAGNOSIS — N50811 Right testicular pain: Secondary | ICD-10-CM

## 2021-05-04 DIAGNOSIS — Z125 Encounter for screening for malignant neoplasm of prostate: Secondary | ICD-10-CM

## 2021-05-04 DIAGNOSIS — K219 Gastro-esophageal reflux disease without esophagitis: Secondary | ICD-10-CM

## 2021-05-04 MED ORDER — AZELASTINE HCL 0.1 % NA SOLN: 2.0000 | Freq: Two times a day (BID) | NASAL | 12 refills | Status: DC

## 2021-05-04 NOTE — Patient Instructions (Signed)
Over the counter Turmeric daily Voltaren Gel for hand pain

## 2021-05-04 NOTE — Progress Notes (Signed)
Complete physical exam   Patient: Joseph Hendricks   DOB: April 19, 1966   55 y.o. Male  MRN: [id number] Visit Date: 05/04/2021  Today's healthcare provider: Wilhemena Durie, MD   No chief complaint on file.  Subjective    Joseph Hendricks is a 55 y.o. male who presents today for a complete physical exam.  He reports consuming a general diet.  He generally feels well. He reports sleeping fairly well. He does not have additional problems to discuss today.  Now exercising daily and watching his eating and is lost 25 pounds with diet and exercise over the past year. Of his daughters is starting her OB residency next year after she finishes medical school. HPI    No past medical history on file. Past Surgical History:  Procedure Laterality Date   APPENDECTOMY     HERNIA REPAIR     KNEE SURGERY     Social History   Socioeconomic History   Marital status: Married    Spouse name: Not on file   Number of children: Not on file   Years of education: Not on file   Highest education level: Not on file  Occupational History   Not on file  Tobacco Use   Smoking status: Never   Smokeless tobacco: Never  Vaping Use   Vaping Use: Never used  Substance and Sexual Activity   Alcohol use: Yes    Comment: occasionally, 3 drinks a week   Drug use: No   Sexual activity: Not on file  Other Topics Concern   Not on file  Social History Narrative   Not on file   Social Determinants of Health   Financial Resource Strain: Not on file  Food Insecurity: Not on file  Transportation Needs: Not on file  Physical Activity: Not on file  Stress: Not on file  Social Connections: Not on file  Intimate Partner Violence: Not on file   Family Status  Relation Name Status   Mother  Alive   Father  Deceased   Sister  Alive   Brother  Alive   Brother  Alive   Family History  Problem Relation Age of Onset   Arthritis Mother        osteo   Diabetes Father    Alzheimer's disease Father     Healthy Sister    Allergies Brother    Cancer Brother        testicular   Healthy Brother    No Known Allergies  Patient Care Team: Jerrol Banana., MD as PCP - General (Family Medicine)   Medications: Outpatient Medications Prior to Visit  Medication Sig   azelastine (ASTELIN) 0.1 % nasal spray Place 2 sprays into both nostrils 2 (two) times daily. Use in each nostril as directed   fluticasone (FLONASE) 50 MCG/ACT nasal spray Use 2 spray(s) in each nostril once daily   lovastatin (MEVACOR) 20 MG tablet TAKE 1 TABLET BY MOUTH AT BEDTIME   Multiple Vitamins-Minerals (MENS MULTIVITAMIN PLUS) TABS Take 1 tablet by mouth daily.   Omeprazole 20 MG TBEC Take 1 tablet (20 mg total) by mouth 2 (two) times daily as needed.   No facility-administered medications prior to visit.    Review of Systems  Constitutional: Negative.   HENT:  Positive for sinus pressure.   Eyes: Negative.   Respiratory: Negative.    Cardiovascular: Negative.   Gastrointestinal: Negative.   Endocrine: Negative.   Genitourinary:  Positive for testicular pain.  Musculoskeletal:  Positive for arthralgias and back pain.  Skin: Negative.   Allergic/Immunologic: Negative.   Neurological: Negative.   Hematological: Negative.   Psychiatric/Behavioral:  Positive for sleep disturbance.   All other systems reviewed and are negative.     Objective    BP 110/76 (BP Location: Right Arm, Patient Position: Sitting, Cuff Size: Normal)   Pulse 74   Temp 98.6 F (37 C) (Oral)   Wt 189 lb (85.7 kg)   SpO2 99%   BMI 26.36 kg/m     Physical Exam Constitutional:      Appearance: Normal appearance. He is normal weight.  HENT:     Head: Normocephalic and atraumatic.     Right Ear: Tympanic membrane, ear canal and external ear normal.     Left Ear: Tympanic membrane, ear canal and external ear normal.     Nose: Nose normal.     Mouth/Throat:     Mouth: Mucous membranes are moist.     Pharynx: Oropharynx is  clear.  Eyes:     Extraocular Movements: Extraocular movements intact.     Conjunctiva/sclera: Conjunctivae normal.     Pupils: Pupils are equal, round, and reactive to light.  Cardiovascular:     Rate and Rhythm: Normal rate and regular rhythm.     Pulses: Normal pulses.     Heart sounds: Normal heart sounds.  Pulmonary:     Effort: Pulmonary effort is normal.     Breath sounds: Normal breath sounds.  Abdominal:     General: Abdomen is flat. Bowel sounds are normal.     Palpations: Abdomen is soft.  Genitourinary:    Penis: Normal.      Testes: Normal.     Comments: Testes are normal without any hernia.  Possible varicocele on the right . Musculoskeletal:        General: Normal range of motion.     Cervical back: Normal range of motion and neck supple.  Skin:    General: Skin is warm and dry.  Neurological:     General: No focal deficit present.     Mental Status: He is alert and oriented to person, place, and time. Mental status is at baseline.  Psychiatric:        Mood and Affect: Mood normal.        Behavior: Behavior normal.        Thought Content: Thought content normal.        Judgment: Judgment normal.     Last depression screening scores PHQ 2/9 Scores 05/04/2021 04/28/2020 04/28/2019  PHQ - 2 Score 0 0 0  PHQ- 9 Score 1 - 0   Last fall risk screening Fall Risk  05/04/2021  Falls in the past year? 0  Number falls in past yr: 0  Injury with Fall? 0  Follow up -   Last Audit-C alcohol use screening Alcohol Use Disorder Test (AUDIT) 05/04/2021  1. How often do you have a drink containing alcohol? 2  2. How many drinks containing alcohol do you have on a typical day when you are drinking? 1  3. How often do you have six or more drinks on one occasion? 1  AUDIT-C Score 4  4. How often during the last year have you found that you were not able to stop drinking once you had started? -  5. How often during the last year have you failed to do what was normally  expected from you because of drinking? -  6.  How often during the last year have you needed a first drink in the morning to get yourself going after a heavy drinking session? -  7. How often during the last year have you had a feeling of guilt of remorse after drinking? -  8. How often during the last year have you been unable to remember what happened the night before because you had been drinking? -  9. Have you or someone else been injured as a result of your drinking? -  10. Has a relative or friend or a doctor or another health worker been concerned about your drinking or suggested you cut down? -  Alcohol Use Disorder Identification Test Final Score (AUDIT) -  Alcohol Brief Interventions/Follow-up -   A score of 3 or more in women, and 4 or more in men indicates increased risk for alcohol abuse, EXCEPT if all of the points are from question 1   No results found for any visits on 05/04/21.  Assessment & Plan    Routine Health Maintenance and Physical Exam  Exercise Activities and Dietary recommendations  Goals   None     Immunization History  Administered Date(s) Administered   Hepatitis B 01/24/2002   Influenza,inj,Quad PF,6+ Mos 04/12/2016, 04/28/2020, 05/04/2021   Influenza-Unspecified 04/10/2015   Moderna Sars-Covid-2 Vaccination 09/17/2019, 10/17/2019   Pneumococcal Polysaccharide-23 06/09/2014   Td 04/23/2018   Tdap 02/03/2008   Typhoid Inactivated 01/24/2002   Zoster Recombinat (Shingrix) 04/28/2020    Health Maintenance  Topic Date Due   Pneumococcal Vaccine 71-39 Years old (2 - PCV) 06/10/2015   COVID-19 Vaccine (3 - Moderna risk series) 11/14/2019   Zoster Vaccines- Shingrix (2 of 2) 06/23/2020   COLONOSCOPY (Pts 45-80yrs Insurance coverage will need to be confirmed)  05/10/2025   TETANUS/TDAP  04/23/2028   INFLUENZA VACCINE  Completed   Hepatitis C Screening  Completed   HIV Screening  Completed   HPV VACCINES  Aged Out    Discussed health benefits of  physical activity, and encouraged him to engage in regular exercise appropriate for his age and condition.  1. Annual physical exam Follow-up 1 year - Lipid panel - CBC w/Diff/Platelet - Comprehensive Metabolic Panel (CMET) - TSH  2. Hypercholesteremia   3. Hyperglycemia Should be improved with weight loss. - Hemoglobin A1c  4. Seasonal allergic rhinitis, unspecified trigger   5. Gastroesophageal reflux disease without esophagitis Better with weight loss  6. Prostate cancer screening  - PSA  7. Need for influenza vaccination  - Flu Vaccine QUAD 81mo+IM (Fluarix, Fluzone & Alfiuria Quad PF)  8. Pain in right testicle Urology referral of this persist or worsens.  No need for work-up now.  Normal exam.  9. 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  Try turmeric daily and try over-the-counter Voltaren gel.   No follow-ups on file.     I, Wilhemena Durie, MD, have reviewed all documentation for this visit. The documentation on 05/04/21 for the exam, diagnosis, procedures, and orders are all accurate and complete.    Karri Kallenbach Cranford Mon, MD  [facility] [phone] (phone) [phone] (fax)  [place]

## 2021-05-06 DIAGNOSIS — Z125 Encounter for screening for malignant neoplasm of prostate: Secondary | ICD-10-CM | POA: Diagnosis not present

## 2021-05-06 DIAGNOSIS — R739 Hyperglycemia, unspecified: Secondary | ICD-10-CM | POA: Diagnosis not present

## 2021-05-06 DIAGNOSIS — Z0000 Encounter for general adult medical examination without abnormal findings: Secondary | ICD-10-CM | POA: Diagnosis not present

## 2021-05-07 LAB — COMPREHENSIVE METABOLIC PANEL
ALT: 21 IU/L (ref 0–44)
AST: 19 IU/L (ref 0–40)
Albumin/Globulin Ratio: 1.6 (ref 1.2–2.2)
Albumin: 4.6 g/dL (ref 3.8–4.9)
Alkaline Phosphatase: 48 IU/L (ref 44–121)
BUN/Creatinine Ratio: 15 (ref 9–20)
BUN: 15 mg/dL (ref 6–24)
Bilirubin Total: 0.9 mg/dL (ref 0.0–1.2)
CO2: 25 mmol/L (ref 20–29)
Calcium: 9.4 mg/dL (ref 8.7–10.2)
Chloride: 101 mmol/L (ref 96–106)
Creatinine, Ser: 0.97 mg/dL (ref 0.76–1.27)
Globulin, Total: 2.8 g/dL (ref 1.5–4.5)
Glucose: 110 mg/dL — ABNORMAL HIGH (ref 70–99)
Potassium: 4.8 mmol/L (ref 3.5–5.2)
Sodium: 140 mmol/L (ref 134–144)
Total Protein: 7.4 g/dL (ref 6.0–8.5)
eGFR: 92 mL/min/{1.73_m2} (ref >59)

## 2021-05-07 LAB — HEMOGLOBIN A1C
Est. average glucose Bld gHb Est-mCnc: 108 mg/dL
Hgb A1c MFr Bld: 5.4 % (ref 4.8–5.6)

## 2021-05-07 LAB — CBC WITH DIFFERENTIAL/PLATELET
Basophils Absolute: 0 10*3/uL (ref 0.0–0.2)
Basos: 1 %
EOS (ABSOLUTE): 0.2 10*3/uL (ref 0.0–0.4)
Eos: 3 %
Hematocrit: 43.6 % (ref 37.5–51.0)
Hemoglobin: 14.8 g/dL (ref 13.0–17.7)
Immature Grans (Abs): 0 10*3/uL (ref 0.0–0.1)
Immature Granulocytes: 0 %
Lymphocytes Absolute: 1 10*3/uL (ref 0.7–3.1)
Lymphs: 21 %
MCH: 31.7 pg (ref 26.6–33.0)
MCHC: 33.9 g/dL (ref 31.5–35.7)
MCV: 93 fL (ref 79–97)
Monocytes Absolute: 0.5 10*3/uL (ref 0.1–0.9)
Monocytes: 11 %
Neutrophils Absolute: 3.2 10*3/uL (ref 1.4–7.0)
Neutrophils: 64 %
Platelets: 244 10*3/uL (ref 150–450)
RBC: 4.67 x10E6/uL (ref 4.14–5.80)
RDW: 11.7 % (ref 11.6–15.4)
WBC: 4.9 10*3/uL (ref 3.4–10.8)

## 2021-05-07 LAB — LIPID PANEL
Chol/HDL Ratio: 3.7 ratio (ref 0.0–5.0)
Cholesterol, Total: 194 mg/dL (ref 100–199)
HDL: 53 mg/dL (ref >39)
LDL Chol Calc (NIH): 123 mg/dL — ABNORMAL HIGH (ref 0–99)
Triglycerides: 101 mg/dL (ref 0–149)
VLDL Cholesterol Cal: 18 mg/dL (ref 5–40)

## 2021-05-07 LAB — PSA: Prostate Specific Ag, Serum: 0.4 ng/mL (ref 0.0–4.0)

## 2021-05-07 LAB — TSH: TSH: 2.03 u[IU]/mL (ref 0.450–4.500)

## 2021-05-29 ENCOUNTER — Other Ambulatory Visit: Payer: Self-pay | Admitting: Family Medicine

## 2021-05-29 DIAGNOSIS — E785 Hyperlipidemia, unspecified: Secondary | ICD-10-CM

## 2021-05-30 NOTE — Telephone Encounter (Signed)
Requested Prescriptions  Pending Prescriptions Disp Refills  . lovastatin (MEVACOR) 20 MG tablet [Pharmacy Med Name: Lovastatin 20 MG Oral Tablet] 90 tablet 3    Sig: TAKE 1 TABLET BY MOUTH AT BEDTIME     Cardiovascular:  Antilipid - Statins Failed - 05/29/2021  9:12 PM      Failed - LDL in normal range and within 360 days    LDL Chol Calc (NIH)  Date Value Ref Range Status  05/06/2021 123 (H) 0 - 99 mg/dL Final         Passed - Total Cholesterol in normal range and within 360 days    Cholesterol, Total  Date Value Ref Range Status  05/06/2021 194 100 - 199 mg/dL Final         Passed - HDL in normal range and within 360 days    HDL  Date Value Ref Range Status  05/06/2021 53 >39 mg/dL Final         Passed - Triglycerides in normal range and within 360 days    Triglycerides  Date Value Ref Range Status  05/06/2021 101 0 - 149 mg/dL Final         Passed - Patient is not pregnant      Passed - Valid encounter within last 12 months    Recent Outpatient Visits          3 weeks ago Annual physical exam   [facility] Jerrol Banana., MD   1 year ago Annual physical exam   [facility] Jerrol Banana., MD   2 years ago Annual physical exam   [facility] Jerrol Banana., MD   2 years ago Chronic pansinusitis   [facility] Jerrol Banana., MD   2 years ago Sinus congestion   [facility] [place], [place]      Future Appointments            In 11 months Jerrol Banana., MD [facility], [place]

## 2021-08-12 DIAGNOSIS — H43812 Vitreous degeneration, left eye: Secondary | ICD-10-CM | POA: Diagnosis not present

## 2021-08-24 ENCOUNTER — Other Ambulatory Visit: Payer: Self-pay | Admitting: Family Medicine

## 2021-08-24 DIAGNOSIS — J302 Other seasonal allergic rhinitis: Secondary | ICD-10-CM

## 2021-08-25 NOTE — Telephone Encounter (Signed)
Requested medications are due for refill today.  yes  Requested medications are on the active medications list.  yes  Last refill. 07/15/2020  16 g /12 refills  Future visit scheduled.   yes  Notes to clinic.  Medication not delegated.    Requested Prescriptions  Pending Prescriptions Disp Refills   fluticasone (FLONASE) 50 MCG/ACT nasal spray [Pharmacy Med Name: Fluticasone Propionate 50 MCG/ACT Nasal Suspension] 48 g 0    Sig: Use 2 spray(s) in each nostril once daily     Not Delegated - Ear, Nose, and Throat: Nasal Preparations - Corticosteroids Failed - 08/24/2021  8:00 PM      Failed - This refill cannot be delegated      Passed - Valid encounter within last 12 months    Recent Outpatient Visits           3 months ago Annual physical exam   [facility] Jerrol Banana., MD   1 year ago Annual physical exam   [facility] Jerrol Banana., MD   2 years ago Annual physical exam   [facility] Jerrol Banana., MD   2 years ago Chronic pansinusitis   [facility] Jerrol Banana., MD   2 years ago Sinus congestion   [facility] Trinna Post, [place]       Future Appointments             In 8 months Jerrol Banana., MD [facility], [place]

## 2022-02-16 ENCOUNTER — Ambulatory Visit: Payer: BC Managed Care – PPO | Admitting: Physician Assistant

## 2022-02-16 ENCOUNTER — Telehealth: Payer: BC Managed Care – PPO | Admitting: Physician Assistant

## 2022-02-16 DIAGNOSIS — J0100 Acute maxillary sinusitis, unspecified: Secondary | ICD-10-CM

## 2022-02-16 DIAGNOSIS — J029 Acute pharyngitis, unspecified: Secondary | ICD-10-CM

## 2022-02-16 MED ORDER — AMOXICILLIN-POT CLAVULANATE 875-125 MG PO TABS
1.0000 | ORAL_TABLET | Freq: Two times a day (BID) | ORAL | 0 refills | Status: DC
Start: 2022-02-16 — End: 2022-11-06

## 2022-02-16 MED ORDER — PREDNISONE 20 MG PO TABS
20.0000 mg | ORAL_TABLET | Freq: Every day | ORAL | 0 refills | Status: DC
Start: 2022-02-16 — End: 2022-02-19

## 2022-02-16 NOTE — Progress Notes (Signed)
Virtual Visit Consent   Joseph Hendricks, you are scheduled for a virtual visit with a [facility] provider today. Just as with appointments in the office, your consent must be obtained to participate. Your consent will be active for this visit and any virtual visit you may have with one of our providers in the next 365 days. If you have a MyChart account, a copy of this consent can be sent to you electronically.  As this is a virtual visit, video technology does not allow for your provider to perform a traditional examination. This may limit your provider's ability to fully assess your condition. If your provider identifies any concerns that need to be evaluated in person or the need to arrange testing (such as labs, EKG, etc.), we will make arrangements to do so. Although advances in technology are sophisticated, we cannot ensure that it will always work on either your end or our end. If the connection with a video visit is poor, the visit may have to be switched to a telephone visit. With either a video or telephone visit, we are not always able to ensure that we have a secure connection.  By engaging in this virtual visit, you consent to the provision of healthcare and authorize for your insurance to be billed (if applicable) for the services provided during this visit. Depending on your insurance coverage, you may receive a charge related to this service.  I need to obtain your verbal consent now. Are you willing to proceed with your visit today? Joseph Hendricks has provided verbal consent on 02/16/2022 for a virtual visit (video or telephone). Leeanne Rio, [place]  Date: 02/16/2022 9:37 AM  Virtual Visit via Video Note   I, Leeanne Rio, connected with  Joseph Hendricks  ([id number], March 20, 1966) on 02/16/22 at  9:30 AM EDT by a video-enabled telemedicine application and verified that I am speaking with the correct person using two identifiers.  Location: Patient: Virtual Visit  Location Patient: Home Provider: Virtual Visit Location Provider: Home Office   I discussed the limitations of evaluation and management by telemedicine and the availability of in person appointments. The patient expressed understanding and agreed to proceed.    History of Present Illness: Joseph Hendricks is a 56 y.o. who identifies as a male who was assigned male at birth, and is being seen today for nasal and head congestion, sinus pressure, ear pressure, PND and sore throat.As of yesterday, throat has become more raw with ulceration at the back R side of throat. Notes some facial  tenderness as well.Denies fever, chills, body aches. No cough. Has continued his daily antihistamine and Flonase, adding on Sudafed.  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 Use in each nostril as directed, Disp: 30 mL, Rfl: 12   fluticasone (FLONASE) 50 MCG/ACT nasal spray, Use 2 spray(s) in each nostril once daily, Disp: 48 g, Rfl: 0   lovastatin (MEVACOR) 20 MG tablet, TAKE 1 TABLET BY MOUTH AT BEDTIME, Disp: 90 tablet, Rfl: 3   Multiple Vitamins-Minerals  (MENS MULTIVITAMIN PLUS) TABS, Take 1 tablet by mouth daily., Disp: , Rfl:   Observations/Objective: Patient is well-developed, well-nourished in no acute distress.  Resting comfortably at home.  Head is normocephalic, atraumatic.  No labored breathing. Speech is clear and coherent with logical content.  Patient is alert and oriented at baseline.  Assessment and Plan: 1. Acute non-recurrent maxillary sinusitis - amoxicillin-clavulanate (AUGMENTIN) 875-125 MG tablet; Take 1 tablet by mouth 2 (two) times daily.  Dispense: 20 tablet; Refill: 0 - predniSONE (DELTASONE) 20 MG tablet; Take 1 tablet (20 mg total) by mouth daily with breakfast for 3 days.  Dispense: 3 tablet; Refill: 0  2. Pharyngitis, unspecified etiology  Sinusitis with a pharyngitis and blistering secondary to substantial PND. Rx Augmentin.  Increase fluids.  Rest.  Saline nasal spray.  Probiotic.  Mucinex as directed.  Humidifier in bedroom. Continue allergy regimen. Will give 3-day burst of low dose prednisone to reduce oropharyngeal and sinus inflammation.  Call or return to clinic if symptoms are not improving.   Follow Up Instructions: I discussed the assessment and treatment plan with the patient. The patient was provided an opportunity to ask questions and all were answered. The patient agreed with the plan and demonstrated an understanding of the instructions.  A copy of instructions were sent to the patient via MyChart unless otherwise noted below.   The patient was advised to call back or seek an in-person evaluation if the symptoms worsen or if the condition fails to improve as anticipated.  Time:  I spent 10 minutes with the patient via telehealth technology discussing the above problems/concerns.    Leeanne Rio, PA-C

## 2022-02-16 NOTE — Patient Instructions (Signed)
Dawson Bills, thank you for joining Leeanne Rio, PA-C for today's virtual visit.  While this provider is not your primary care provider (PCP), if your PCP is located in our provider database this encounter information will be shared with them immediately following your visit.  Consent: (Patient) Joseph Hendricks provided verbal consent for this virtual visit at the beginning of the encounter.  Current Medications:  Current Outpatient Medications:    azelastine (ASTELIN) 0.1 % nasal spray, Place 2 sprays into both nostrils 2 (two) times daily. Use in each nostril as directed, Disp: 30 mL, Rfl: 12   fluticasone (FLONASE) 50 MCG/ACT nasal spray, Use 2 spray(s) in each nostril once daily, Disp: 48 g, Rfl: 0   lovastatin (MEVACOR) 20 MG tablet, TAKE 1 TABLET BY MOUTH AT BEDTIME, Disp: 90 tablet, Rfl: 3   Multiple Vitamins-Minerals (MENS MULTIVITAMIN PLUS) TABS, Take 1 tablet by mouth daily., Disp: , Rfl:    Omeprazole 20 MG TBEC, Take 1 tablet (20 mg total) by mouth 2 (two) times daily as needed., Disp: 60 tablet, Rfl: 11   Medications ordered in this encounter:  No orders of the defined types were placed in this encounter.    *If you need refills on other medications prior to your next appointment, please contact your pharmacy*  Follow-Up: Call back or seek an in-person evaluation if the symptoms worsen or if the condition fails to improve as anticipated.  Other Instructions Please take antibiotic as directed.  Increase fluid intake.  Use Saline nasal spray.  Take a daily multivitamin. Continue your allergy medications. Take the steroid as direcrted.  Place a humidifier in the bedroom.  Please call or return clinic if symptoms are not improving.  Sinusitis Sinusitis is redness, soreness, and swelling (inflammation) of the paranasal sinuses. Paranasal sinuses are air pockets within the bones of your face (beneath the eyes, the middle of the forehead, or above the eyes). In healthy  paranasal sinuses, mucus is able to drain out, and air is able to circulate through them by way of your nose. However, when your paranasal sinuses are inflamed, mucus and air can become trapped. This can allow bacteria and other germs to grow and cause infection. Sinusitis can develop quickly and last only a short time (acute) or continue over a long period (chronic). Sinusitis that lasts for more than 12 weeks is considered chronic.  CAUSES  Causes of sinusitis include: Allergies. Structural abnormalities, such as displacement of the cartilage that separates your nostrils (deviated septum), which can decrease the air flow through your nose and sinuses and affect sinus drainage. Functional abnormalities, such as when the small hairs (cilia) that line your sinuses and help remove mucus do not work properly or are not present. SYMPTOMS  Symptoms of acute and chronic sinusitis are the same. The primary symptoms are pain and pressure around the affected sinuses. Other symptoms include: Upper toothache. Earache. Headache. Bad breath. Decreased sense of smell and taste. A cough, which worsens when you are lying flat. Fatigue. Fever. Thick drainage from your nose, which often is green and may contain pus (purulent). Swelling and warmth over the affected sinuses. DIAGNOSIS  Your caregiver will perform a physical exam. During the exam, your caregiver may: Look in your nose for signs of abnormal growths in your nostrils (nasal polyps). Tap over the affected sinus to check for signs of infection. View the inside of your sinuses (endoscopy) with a special imaging device with a light attached (endoscope), which is inserted  into your sinuses. If your caregiver suspects that you have chronic sinusitis, one or more of the following tests may be recommended: Allergy tests. Nasal culture A sample of mucus is taken from your nose and sent to a lab and screened for bacteria. Nasal cytology A sample of mucus  is taken from your nose and examined by your caregiver to determine if your sinusitis is related to an allergy. TREATMENT  Most cases of acute sinusitis are related to a viral infection and will resolve on their own within 10 days. Sometimes medicines are prescribed to help relieve symptoms (pain medicine, decongestants, nasal steroid sprays, or saline sprays).  However, for sinusitis related to a bacterial infection, your caregiver will prescribe antibiotic medicines. These are medicines that will help kill the bacteria causing the infection.  Rarely, sinusitis is caused by a fungal infection. In theses cases, your caregiver will prescribe antifungal medicine. For some cases of chronic sinusitis, surgery is needed. Generally, these are cases in which sinusitis recurs more than 3 times per year, despite other treatments. HOME CARE INSTRUCTIONS  Drink plenty of water. Water helps thin the mucus so your sinuses can drain more easily. Use a humidifier. Inhale steam 3 to 4 times a day (for example, sit in the bathroom with the shower running). Apply a warm, moist washcloth to your face 3 to 4 times a day, or as directed by your caregiver. Use saline nasal sprays to help moisten and clean your sinuses. Take over-the-counter or prescription medicines for pain, discomfort, or fever only as directed by your caregiver. SEEK IMMEDIATE MEDICAL CARE IF: You have increasing pain or severe headaches. You have nausea, vomiting, or drowsiness. You have swelling around your face. You have vision problems. You have a stiff neck. You have difficulty breathing. MAKE SURE YOU:  Understand these instructions. Will watch your condition. Will get help right away if you are not doing well or get worse. Document Released: 06/26/2005 Document Revised: 09/18/2011 Document Reviewed: 07/11/2011 [facility] Patient Information 2014 [place], [place].    If you have been instructed to have an in-person evaluation today at a  local Urgent Care facility, please use the link below. It will take you to a list of all of our available [facility], including address, phone number and hours of operation. Please do not delay care.  [facility]  If you or a family member do not have a primary care provider, use the link below to schedule a visit and establish care. When you choose a [facility] primary care physician or advanced practice provider, you gain a long-term partner in health. Find a Primary Care Provider  Learn more about [facility]'s in-office and virtual care options: [facility]

## 2022-02-16 NOTE — Progress Notes (Deleted)
     I,Jana Beckey Polkowski,acting as a Education administrator for [employer], PA-C.,have documented all relevant documentation on the behalf of Mardene Speak, PA-C,as directed by  [employer], PA-C while in the presence of [employer], PA-C.   Established patient visit   Patient: Joseph Hendricks   DOB: 28-Aug-1965   56 y.o. Male  MRN: [id number] Visit Date: 02/16/2022  Today's healthcare provider: Mardene Speak, PA-C   No chief complaint on file.  Subjective    HPI  ***  Medications: Outpatient Medications Prior to Visit  Medication Sig   azelastine (ASTELIN) 0.1 % nasal spray Place 2 sprays into both nostrils 2 (two) times daily. Use in each nostril as directed   fluticasone (FLONASE) 50 MCG/ACT nasal spray Use 2 spray(s) in each nostril once daily   lovastatin (MEVACOR) 20 MG tablet TAKE 1 TABLET BY MOUTH AT BEDTIME   Multiple Vitamins-Minerals (MENS MULTIVITAMIN PLUS) TABS Take 1 tablet by mouth daily.   Omeprazole 20 MG TBEC Take 1 tablet (20 mg total) by mouth 2 (two) times daily as needed.   No facility-administered medications prior to visit.    Review of Systems  {Labs  Heme  Chem  Endocrine  Serology  Results Review (optional):23779}   Objective    There were no vitals taken for this visit. {Show previous vital signs (optional):23777}  Physical Exam  ***  No results found for any visits on 02/16/22.  Assessment & Plan     ***  No follow-ups on file.      {provider attestation***:1}   Mardene Speak, Hershal Coria  [facility] [phone] (phone) [phone] (fax)  [place]

## 2022-05-09 ENCOUNTER — Encounter: Payer: BC Managed Care – PPO | Admitting: Family Medicine

## 2022-07-04 ENCOUNTER — Telehealth: Payer: Self-pay | Admitting: Family Medicine

## 2022-07-04 DIAGNOSIS — E785 Hyperlipidemia, unspecified: Secondary | ICD-10-CM

## 2022-07-04 NOTE — Telephone Encounter (Signed)
Denied.

## 2022-08-24 DIAGNOSIS — R051 Acute cough: Secondary | ICD-10-CM | POA: Diagnosis not present

## 2022-08-24 DIAGNOSIS — R059 Cough, unspecified: Secondary | ICD-10-CM | POA: Diagnosis not present

## 2022-08-24 DIAGNOSIS — J189 Pneumonia, unspecified organism: Secondary | ICD-10-CM | POA: Diagnosis not present

## 2022-10-03 DIAGNOSIS — N400 Enlarged prostate without lower urinary tract symptoms: Secondary | ICD-10-CM | POA: Diagnosis not present

## 2022-10-03 DIAGNOSIS — E7800 Pure hypercholesterolemia, unspecified: Secondary | ICD-10-CM | POA: Diagnosis not present

## 2022-10-03 DIAGNOSIS — Z0000 Encounter for general adult medical examination without abnormal findings: Secondary | ICD-10-CM | POA: Diagnosis not present

## 2022-10-03 DIAGNOSIS — Z125 Encounter for screening for malignant neoplasm of prostate: Secondary | ICD-10-CM | POA: Diagnosis not present

## 2022-10-03 DIAGNOSIS — R972 Elevated prostate specific antigen [PSA]: Secondary | ICD-10-CM | POA: Diagnosis not present

## 2022-10-03 DIAGNOSIS — J4520 Mild intermittent asthma, uncomplicated: Secondary | ICD-10-CM | POA: Diagnosis not present

## 2022-10-03 DIAGNOSIS — J189 Pneumonia, unspecified organism: Secondary | ICD-10-CM | POA: Diagnosis not present

## 2022-10-03 DIAGNOSIS — Z131 Encounter for screening for diabetes mellitus: Secondary | ICD-10-CM | POA: Diagnosis not present

## 2022-10-03 DIAGNOSIS — J309 Allergic rhinitis, unspecified: Secondary | ICD-10-CM | POA: Diagnosis not present

## 2022-10-03 HISTORY — DX: Benign prostatic hyperplasia without lower urinary tract symptoms: N40.0

## 2022-10-06 ENCOUNTER — Telehealth: Payer: Self-pay | Admitting: Emergency Medicine

## 2022-10-06 NOTE — Telephone Encounter (Signed)
Need to set this pt up for a new consult for cough and shortness of breath.  Please set him up asap, ok to use one of my blocked slots, or to get him in with another provider if that is more timely.

## 2022-10-06 NOTE — Telephone Encounter (Signed)
Spoke with patient scheduled him next Thursday with Dr. Lamonte Sakai. Advised pt to arrive early to complete paperwork. NFN

## 2022-10-09 DIAGNOSIS — R053 Chronic cough: Secondary | ICD-10-CM

## 2022-10-09 DIAGNOSIS — J849 Interstitial pulmonary disease, unspecified: Secondary | ICD-10-CM

## 2022-10-09 HISTORY — DX: Interstitial pulmonary disease, unspecified: J84.9

## 2022-10-09 HISTORY — DX: Chronic cough: R05.3

## 2022-10-12 ENCOUNTER — Encounter: Payer: Self-pay | Admitting: Emergency Medicine

## 2022-10-12 ENCOUNTER — Ambulatory Visit: Payer: BC Managed Care – PPO | Admitting: Emergency Medicine

## 2022-10-12 VITALS — BP 126/74 | HR 91 | Temp 98.4°F | Ht 69.5 in | Wt 193.4 lb

## 2022-10-12 DIAGNOSIS — R059 Cough, unspecified: Secondary | ICD-10-CM

## 2022-10-12 DIAGNOSIS — R053 Chronic cough: Secondary | ICD-10-CM

## 2022-10-12 MED ORDER — PANTOPRAZOLE SODIUM 40 MG PO TBEC: 40.0000 mg | DELAYED_RELEASE_TABLET | Freq: Every day | ORAL | 5 refills | Status: DC

## 2022-10-12 MED ORDER — HYDROCODONE BIT-HOMATROP MBR 5-1.5 MG/5ML PO SOLN: 5.0000 mL | Freq: Four times a day (QID) | ORAL | 0 refills | Status: DC | PRN

## 2022-10-12 MED ORDER — BENZONATATE 100 MG PO CAPS: 100.0000 mg | ORAL_CAPSULE | Freq: Four times a day (QID) | ORAL | 1 refills | Status: DC | PRN

## 2022-10-12 NOTE — Progress Notes (Signed)
Subjective:    Patient ID: Joseph Hendricks, male    DOB: 07-Feb-1966, 57 y.o.   MRN: [id number]  HPI 57 year old gentleman, never smoker, with history of Bergmann's Syndrome with GERD, allergic rhinitis, hypercholesterolemia, possible obstructive lung disease, ? Exercise induced asthma.  He is here today to evaluate chronic cough and shortness of breath He was well until late January when he developed cough, associated with rhinorrhea and congestion.  He tried over-the-counter medications without relief.  He had a globus sensation, ? Some obstruction of air movement.  He was treated with serial prescriptions for prednisone and antibiotics beginning 08/24/2022, visit was 10/03/2022.  He was also started on Asmanex at that time for possible asthma.  Chest x-rays performed as below with reports available but no images.   He did have some improvement in cough and air movement on pred. Currently he is experiencing some throat clearing, still some cough that is minimally productive. He does not have any overt GERD. He is having chronic rhinitis active currently.   Chest x-ray 08/24/2022 reviewed by me, with some mild mid and lower lung streakiness and patchy bibasilar opacity particularly on the left  Chest x-ray 10/03/2022 reviewed by me, persistent confluent opacity in the lingula and linear opacities also seen on the right without any focal worsening.  No other new infiltrates.   Review of Systems As per HPI  PMH: Allergic rhinitis GERD [name] Hypercholesterolemia Benign fibroma prostate Basal cell skin carcinoma   Family History  Problem Relation Age of Onset   Arthritis Mother        osteo   Diabetes Father    Alzheimer's disease Father    Healthy Sister    Allergies Brother    Cancer Brother        testicular   Healthy Brother      Social History   Socioeconomic History   Marital status: Married    Spouse name: Not on file   Number of children: Not on file    Years of education: Not on file   Highest education level: Not on file  Occupational History   Not on file  Tobacco Use   Smoking status: Never   Smokeless tobacco: Never  Vaping Use   Vaping Use: Never used  Substance and Sexual Activity   Alcohol use: Yes    Comment: occasionally, 3 drinks a week   Drug use: No   Sexual activity: Not on file  Other Topics Concern   Not on file  Social History Narrative   Not on file   Social Determinants of Health   Financial Resource Strain: Not on file  Food Insecurity: Not on file  Transportation Needs: Not on file  Physical Activity: Not on file  Stress: Not on file  Social Connections: Not on file  Intimate Partner Violence: Not on file     No Known Allergies   Outpatient Medications Prior to Visit  Medication Sig Dispense Refill   azelastine (ASTELIN) 0.1 % nasal spray Place 2 sprays into both nostrils 2 (two) times daily. Use in each nostril as directed 30 mL 12   fluticasone (FLONASE) 50 MCG/ACT nasal spray Use 2 spray(s) in each nostril once daily 48 g 0   lovastatin (MEVACOR) 20 MG tablet TAKE 1 TABLET BY MOUTH AT BEDTIME 90 tablet 3   amoxicillin-clavulanate (AUGMENTIN) 875-125 MG tablet Take 1 tablet by mouth 2 (two) times daily. 20 tablet 0   Multiple Vitamins-Minerals (MENS MULTIVITAMIN PLUS) TABS Take  1 tablet by mouth daily. (Patient not taking: Reported on 10/12/2022)     No facility-administered medications prior to visit.         Objective:   Physical Exam Vitals:   10/12/22 1259  BP: 126/74  Pulse: 91  Temp: 98.4 F (36.9 C)  TempSrc: Oral  SpO2: 96%  Weight: 193 lb 6.4 oz (87.7 kg)  Height: 5' 9.5" (1.765 m)    Gen: Pleasant, well-nourished, in no distress,  normal affect, frequent throat clearing  ENT: No lesions,  mouth clear,  oropharynx clear, no postnasal drip  Neck: No JVD, no stridor  Lungs: No use of accessory muscles, difficult exam due to frequent coughing, no overt wheezing.  He does  have some bibasilar inspiratory crackles.  Cardiovascular: RRR, heart sounds normal, no murmur or gallops, no peripheral edema  Musculoskeletal: No deformities, no cyanosis or clubbing  Neuro: alert, awake, non focal  Skin: Warm, no lesions or rash      Assessment & Plan:  Chronic cough Started without clear precipitant back in late January, was associated with an abnormal chest x-ray that prompted treatment with antibiotics for presumed pneumonia.  Much of his residual symptom burden sounds upper airway in nature but must also consider his history of possible asthma, his persistent abnormal imaging based on chest x-ray report from 10/03/2022.  I will try to treat the usual causes of sustained cough, try to suppress his cough to decrease upper airway irritation.  He needs pulmonary function testing to quantify any obstruction that is present.  I think we can stop Asmanex for now especially since it can be an upper airway irritant itself.  Finally he needs a CT scan of his chest to clarify infiltrates that have been seen on his chest x-rays, determine whether these are contributors to his persistent cough.  Will plan further workup depending on how he responds to treatment plan and depending on his PFTs, imaging.    We reviewed your chest x-rays today We will arrange for a CT scan of your chest We will arrange for full pulmonary function testing Stop Asmanex at this time Okay to keep albuterol available to use 2 puffs if needed for shortness of breath, chest tightness, wheezing. Please increase your fluticasone nasal spray to 2 sprays each side twice a day. Okay to use your Astelin nasal spray, 2 sprays each nostril 2-3 times daily when you need it for congestion and drainage Start pantoprazole 40 mg once daily.  Take this medication 1 hour around food. You need to work on avoiding throat clearing if at all possible.  It might be helpful for you to use a nonventilated cough drop or sugar-free  candy.  When you have the urge to clear your throat, just swallow.  The sensation will go away. Try using Tessalon Perles 100 mg up to every 6 hours if needed for cough suppression. Try using Hycodan 5 cc up to every 6 hours if needed for cough suppression. Follow Dr. Lamonte Sakai next available after your CT chest and pulmonary function testing have been performed so we can review.   Baltazar Apo, MD, PhD 10/12/2022, 2:58 PM [facility] [phone] or if no answer before 7:00PM call [phone] For any issues after 7:00PM please call eLink [phone]

## 2022-10-12 NOTE — Assessment & Plan Note (Addendum)
Started without clear precipitant back in late January, was associated with an abnormal chest x-ray that prompted treatment with antibiotics for presumed pneumonia.  Much of his residual symptom burden sounds upper airway in nature but must also consider his history of possible asthma, his persistent abnormal imaging based on chest x-ray report from 10/03/2022.  I will try to treat the usual causes of sustained cough, try to suppress his cough to decrease upper airway irritation.  He needs pulmonary function testing to quantify any obstruction that is present.  I think we can stop Asmanex for now especially since it can be an upper airway irritant itself.  Finally he needs a CT scan of his chest to clarify infiltrates that have been seen on his chest x-rays, determine whether these are contributors to his persistent cough.  Will plan further workup depending on how he responds to treatment plan and depending on his PFTs, imaging.    We reviewed your chest x-rays today We will arrange for a CT scan of your chest We will arrange for full pulmonary function testing Stop Asmanex at this time Okay to keep albuterol available to use 2 puffs if needed for shortness of breath, chest tightness, wheezing. Please increase your fluticasone nasal spray to 2 sprays each side twice a day. Okay to use your Astelin nasal spray, 2 sprays each nostril 2-3 times daily when you need it for congestion and drainage Start pantoprazole 40 mg once daily.  Take this medication 1 hour around food. You need to work on avoiding throat clearing if at all possible.  It might be helpful for you to use a nonventilated cough drop or sugar-free candy.  When you have the urge to clear your throat, just swallow.  The sensation will go away. Try using Tessalon Perles 100 mg up to every 6 hours if needed for cough suppression. Try using Hycodan 5 cc up to every 6 hours if needed for cough suppression. Follow Dr. Lamonte Sakai next available after  your CT chest and pulmonary function testing have been performed so we can review.

## 2022-10-12 NOTE — Patient Instructions (Signed)
We reviewed your chest x-rays today We will arrange for a CT scan of your chest We will arrange for full pulmonary function testing Stop Asmanex at this time Okay to keep albuterol available to use 2 puffs if needed for shortness of breath, chest tightness, wheezing. Please increase your fluticasone nasal spray to 2 sprays each side twice a day. Okay to use your Astelin nasal spray, 2 sprays each nostril 2-3 times daily when you need it for congestion and drainage Start pantoprazole 40 mg once daily.  Take this medication 1 hour around food. You need to work on avoiding throat clearing if at all possible.  It might be helpful for you to use a nonventilated cough drop or sugar-free candy.  When you have the urge to clear your throat, just swallow.  The sensation will go away. Try using Tessalon Perles 100 mg up to every 6 hours if needed for cough suppression. Try using Hycodan 5 cc up to every 6 hours if needed for cough suppression. Follow Dr. Lamonte Sakai next available after your CT chest and pulmonary function testing have been performed so we can review.

## 2022-10-18 ENCOUNTER — Ambulatory Visit
Admission: RE | Admit: 2022-10-18 | Discharge: 2022-10-18 | Disposition: A | Discharge status: Home / Self Care | Payer: BC Managed Care – PPO | Source: Ambulatory Visit | Attending: Emergency Medicine | Admitting: Emergency Medicine

## 2022-10-18 DIAGNOSIS — R911 Solitary pulmonary nodule: Secondary | ICD-10-CM | POA: Diagnosis not present

## 2022-10-18 DIAGNOSIS — J479 Bronchiectasis, uncomplicated: Secondary | ICD-10-CM | POA: Diagnosis not present

## 2022-10-18 DIAGNOSIS — I700 Atherosclerosis of aorta: Secondary | ICD-10-CM | POA: Diagnosis not present

## 2022-10-18 DIAGNOSIS — R059 Cough, unspecified: Secondary | ICD-10-CM

## 2022-10-18 DIAGNOSIS — I7121 Aneurysm of the ascending aorta, without rupture: Secondary | ICD-10-CM | POA: Diagnosis not present

## 2022-10-19 ENCOUNTER — Telehealth: Payer: Self-pay | Admitting: Emergency Medicine

## 2022-10-19 NOTE — Telephone Encounter (Signed)
Please let him know that his CT chest show some persistent mild inflammation and possible scar formation peripherally. We will need to follow up to discuss whether any further testing would be helpful to better characterize. He may benefit from bronchoscopy. I would like to see him to review

## 2022-10-19 NOTE — Telephone Encounter (Signed)
Dr. Delton Coombes, please advise on the results of pt's recent CT scan that was recently performed.

## 2022-10-19 NOTE — Telephone Encounter (Signed)
Patient would like results of CT scan. Patient phone number is [phone].

## 2022-10-20 NOTE — Telephone Encounter (Signed)
Attempted to call pt but unable to reach. Left message to return call.  

## 2022-10-20 NOTE — Telephone Encounter (Signed)
Patient is returning phone call. Patient phone number is [phone]. 

## 2022-10-20 NOTE — Telephone Encounter (Signed)
Patient is returning phone call. Patient phone number is [phone].

## 2022-10-20 NOTE — Telephone Encounter (Signed)
ATC X1 LVM for patient to call the office back 

## 2022-10-23 NOTE — Telephone Encounter (Signed)
Called and spoke with pt letting him know the results of CT and recs per Dr. Delton Coombes and he verbalized understanding. Appt scheduled for pt with RB. Nothing further needed.

## 2022-10-27 ENCOUNTER — Ambulatory Visit: Payer: BC Managed Care – PPO | Admitting: Emergency Medicine

## 2022-10-27 ENCOUNTER — Telehealth: Payer: Self-pay | Admitting: Emergency Medicine

## 2022-10-27 ENCOUNTER — Other Ambulatory Visit: Payer: Self-pay | Admitting: Emergency Medicine

## 2022-10-27 ENCOUNTER — Encounter: Payer: Self-pay | Admitting: Emergency Medicine

## 2022-10-27 VITALS — BP 122/74 | HR 71 | Temp 98.4°F | Ht 70.0 in | Wt 192.6 lb

## 2022-10-27 DIAGNOSIS — J849 Interstitial pulmonary disease, unspecified: Secondary | ICD-10-CM | POA: Insufficient documentation

## 2022-10-27 DIAGNOSIS — R053 Chronic cough: Secondary | ICD-10-CM | POA: Diagnosis not present

## 2022-10-27 DIAGNOSIS — R918 Other nonspecific abnormal finding of lung field: Secondary | ICD-10-CM

## 2022-10-27 NOTE — Telephone Encounter (Signed)
Pt called the office stating that after speaking with Dr. Dorris Fetch, he has decided to have the bronchoscopy performed.  Pt is needing to get this scheduled. Routing to both Dr. Delton Coombes and procedure pool.

## 2022-10-27 NOTE — Progress Notes (Signed)
Bronchoscopy request placed

## 2022-10-27 NOTE — Progress Notes (Signed)
Subjective:    Patient ID: Joseph Hendricks, male    DOB: 1966-05-19, 57 y.o.   MRN: [id number]  HPI  ROV 10/27/22 --[name] follows up today for his persistent cough, associated dyspnea.  He is 61, never smoker with GERD, allergic rhinitis, question exercise-induced asthma.  We initiated pantoprazole, increased his fluticasone nasal spray to twice a day, try Tessalon and Hycodan for cough suppression at his initial visit.  I also stopped his Asmanex in the absence of any definitive evidence for asthma.  He had a CT chest to evaluate infiltrates noted on chest x-ray as his illness evolved. His cough is significantly better - he thinks the nasal congestion was the biggest contributor, because there was a delay in getting his PPI started. Not needing as much tessalon or hycodan. He is having some increased nasal congestion now since the pollen increased.   CT chest 10/18/2022 reviewed by me shows no mediastinal or hilar adenopathy, some aortic calcification, bibasilar noncontiguous peripheral groundglass and interstitial thickening with some traction bronchiectasis, no overt honeycomb change.    Review of Systems As per HPI  PMH: Allergic rhinitis GERD [name] Hypercholesterolemia Benign fibroma prostate Basal cell skin carcinoma   Family History  Problem Relation Age of Onset   Arthritis Mother        osteo   Diabetes Father    Alzheimer's disease Father    Healthy Sister    Allergies Brother    Cancer Brother        testicular   Healthy Brother      Social History   Socioeconomic History   Marital status: Married    Spouse name: Not on file   Number of children: Not on file   Years of education: Not on file   Highest education level: Not on file  Occupational History   Not on file  Tobacco Use   Smoking status: Never   Smokeless tobacco: Never  Vaping Use   Vaping Use: Never used  Substance and Sexual Activity   Alcohol use: Yes    Comment: occasionally, 3  drinks a week   Drug use: No   Sexual activity: Not on file  Other Topics Concern   Not on file  Social History Narrative   Not on file   Social Determinants of Health   Financial Resource Strain: Not on file  Food Insecurity: Not on file  Transportation Needs: Not on file  Physical Activity: Not on file  Stress: Not on file  Social Connections: Not on file  Intimate Partner Violence: Not on file     No Known Allergies   Outpatient Medications Prior to Visit  Medication Sig Dispense Refill   azelastine (ASTELIN) 0.1 % nasal spray Place 2 sprays into both nostrils 2 (two) times daily. Use in each nostril as directed 30 mL 12   benzonatate (TESSALON) 100 MG capsule Take 1 capsule (100 mg total) by mouth every 6 (six) hours as needed for cough. 30 capsule 1   fluticasone (FLONASE) 50 MCG/ACT nasal spray Use 2 spray(s) in each nostril once daily 48 g 0   HYDROcodone bit-homatropine (HYCODAN) 5-1.5 MG/5ML syrup Take 5 mLs by mouth every 6 (six) hours as needed for cough. 240 mL 0   lovastatin (MEVACOR) 20 MG tablet TAKE 1 TABLET BY MOUTH AT BEDTIME 90 tablet 3   Multiple Vitamins-Minerals (MENS MULTIVITAMIN PLUS) TABS Take 1 tablet by mouth daily.     pantoprazole (PROTONIX) 40 MG tablet Take 1  tablet (40 mg total) by mouth daily. 30 tablet 5   amoxicillin-clavulanate (AUGMENTIN) 875-125 MG tablet Take 1 tablet by mouth 2 (two) times daily. 20 tablet 0   No facility-administered medications prior to visit.         Objective:   Physical Exam Vitals:   10/27/22 1039  BP: 122/74  Pulse: 71  Temp: 98.4 F (36.9 C)  TempSrc: Oral  SpO2: 96%  Weight: 192 lb 9.6 oz (87.4 kg)  Height:  (1.778 m)    Gen: Pleasant, well-nourished, in no distress,  normal affect, frequent throat clearing  ENT: No lesions,  mouth clear,  oropharynx clear, no postnasal drip  Neck: No JVD, no stridor  Lungs: No use of accessory muscles, difficult exam due to frequent coughing, no overt  wheezing.  He does have some bibasilar inspiratory crackles.  Cardiovascular: RRR, heart sounds normal, no murmur or gallops, no peripheral edema  Musculoskeletal: No deformities, no cyanosis or clubbing  Neuro: alert, awake, non focal  Skin: Warm, no lesions or rash      Assessment & Plan:  Chronic cough His cough has improved with the increase in his fluticasone to twice daily, cough suppression with Tessalon and Hycodan.  We also started pantoprazole but there was a delay in initiating this and unsure whether GERD was a big contributor to sustained cough.  Will attempt to wean some of these medications to see if he can tolerate.  Given his chronic rhinitis, active allergy at this time would not stop the fluticasone at least for now.  Based on the improvement suspect that the rhinitis was sustaining his cough.  The questions still remain regarding the patch she groundglass and interstitial changes bilaterally, whether he may have asthma.  Pulmonary function testing has been ordered to evaluate for airflow obstruction and will be done in May.  ILD (interstitial lung disease) Bilateral base predominant peripheral patchy groundglass/interstitial change with some associated traction bronchiectasis and early scar formation noted on CT chest.  Pattern often seen as a sequela of COVID-19 infection although he does not have any documented history of COVID-19.  Question whether an undiagnosed mild case may have precipitated his cough in the beginning of this illness.  Discussed with him the differential diagnosis for his pulmonary infiltrates including autoimmune disease (again undiagnosed), opportunistic infection.  I will check screening autoimmune labs.  Discussed the pros and cons of bronchoscopy with BAL for culture and cell count, transbronchial biopsies.  He is going to think about this and let me know if he wants to schedule.   Levy Pupa, MD, PhD 10/27/2022, 11:25 AM [facility] [phone] or if no answer before 7:00PM call [phone] For any issues after 7:00PM please call eLink [phone]

## 2022-10-27 NOTE — Assessment & Plan Note (Signed)
Bilateral base predominant peripheral patchy groundglass/interstitial change with some associated traction bronchiectasis and early scar formation noted on CT chest.  Pattern often seen as a sequela of COVID-19 infection although he does not have any documented history of COVID-19.  Question whether an undiagnosed mild case may have precipitated his cough in the beginning of this illness.  Discussed with him the differential diagnosis for his pulmonary infiltrates including autoimmune disease (again undiagnosed), opportunistic infection.  I will check screening autoimmune labs.  Discussed the pros and cons of bronchoscopy with BAL for culture and cell count, transbronchial biopsies.  He is going to think about this and let me know if he wants to schedule.

## 2022-10-27 NOTE — Patient Instructions (Addendum)
I am glad that your cough has improved. Try coming off your Tessalon and Hycodan.  If you tolerate this then we will try stopping your pantoprazole. Continue the fluticasone nasal spray as you have been taking it We will perform full pulmonary function testing in May as planned We reviewed your CT scan of the chest today.  There is evidence for acute and chronic inflammatory change in both lungs. We will perform blood work (autoimmune screening labs) today. We talked about the pros and cons of bronchoscopy to obtain washings for culture data and small biopsies to evaluate your lung inflammation.  Consider this and call if you decide you want to proceed with bronchoscopy so we can arrange. Follow Dr. Delton Coombes in May after your PFT as planned.

## 2022-10-27 NOTE — H&P (View-Only) (Signed)
 Subjective:    Patient ID: Joseph Hendricks, male    DOB: 01/08/1966, 57 y.o.   MRN: [id number]  HPI  ROV 10/27/22 --Joseph Hendricks follows up today for his persistent cough, associated dyspnea.  He is 57, never smoker with GERD, allergic rhinitis, question exercise-induced asthma.  We initiated pantoprazole, increased his fluticasone nasal spray to twice a day, try Tessalon and Hycodan for cough suppression at his initial visit.  I also stopped his Asmanex in the absence of any definitive evidence for asthma.  He had a CT chest to evaluate infiltrates noted on chest x-ray as his illness evolved. His cough is significantly better - he thinks the nasal congestion was the biggest contributor, because there was a delay in getting his PPI started. Not needing as much tessalon or hycodan. He is having some increased nasal congestion now since the pollen increased.   CT chest 10/18/2022 reviewed by me shows no mediastinal or hilar adenopathy, some aortic calcification, bibasilar noncontiguous peripheral groundglass and interstitial thickening with some traction bronchiectasis, no overt honeycomb change.    Review of Systems As per HPI  PMH: Allergic rhinitis GERD [name] Hypercholesterolemia Benign fibroma prostate Basal cell skin carcinoma   Family History  Problem Relation Age of Onset   Arthritis Mother        osteo   Diabetes Father    Alzheimer's disease Father    Healthy Sister    Allergies Brother    Cancer Brother        testicular   Healthy Brother      Social History   Socioeconomic History   Marital status: Married    Spouse name: Not on file   Number of children: Not on file   Years of education: Not on file   Highest education level: Not on file  Occupational History   Not on file  Tobacco Use   Smoking status: Never   Smokeless tobacco: Never  Vaping Use   Vaping Use: Never used  Substance and Sexual Activity   Alcohol use: Yes    Comment: occasionally, 3  drinks a week   Drug use: No   Sexual activity: Not on file  Other Topics Concern   Not on file  Social History Narrative   Not on file   Social Determinants of Health   Financial Resource Strain: Not on file  Food Insecurity: Not on file  Transportation Needs: Not on file  Physical Activity: Not on file  Stress: Not on file  Social Connections: Not on file  Intimate Partner Violence: Not on file     No Known Allergies   Outpatient Medications Prior to Visit  Medication Sig Dispense Refill   azelastine (ASTELIN) 0.1 % nasal spray Place 2 sprays into both nostrils 2 (two) times daily. Use in each nostril as directed 30 mL 12   benzonatate (TESSALON) 100 MG capsule Take 1 capsule (100 mg total) by mouth every 6 (six) hours as needed for cough. 30 capsule 1   fluticasone (FLONASE) 50 MCG/ACT nasal spray Use 2 spray(s) in each nostril once daily 48 g 0   HYDROcodone bit-homatropine (HYCODAN) 5-1.5 MG/5ML syrup Take 5 mLs by mouth every 6 (six) hours as needed for cough. 240 mL 0   lovastatin (MEVACOR) 20 MG tablet TAKE 1 TABLET BY MOUTH AT BEDTIME 90 tablet 3   Multiple Vitamins-Minerals (MENS MULTIVITAMIN PLUS) TABS Take 1 tablet by mouth daily.     pantoprazole (PROTONIX) 40 MG tablet Take 1   tablet (40 mg total) by mouth daily. 30 tablet 5   amoxicillin-clavulanate (AUGMENTIN) 875-125 MG tablet Take 1 tablet by mouth 2 (two) times daily. 20 tablet 0   No facility-administered medications prior to visit.         Objective:   Physical Exam Vitals:   10/27/22 1039  BP: 122/74  Pulse: 71  Temp: 98.4 F (36.9 C)  TempSrc: Oral  SpO2: 96%  Weight: 192 lb 9.6 oz (87.4 kg)  Height: 5' 10" (1.778 m)    Gen: Pleasant, well-nourished, in no distress,  normal affect, frequent throat clearing  ENT: No lesions,  mouth clear,  oropharynx clear, no postnasal drip  Neck: No JVD, no stridor  Lungs: No use of accessory muscles, difficult exam due to frequent coughing, no overt  wheezing.  He does have some bibasilar inspiratory crackles.  Cardiovascular: RRR, heart sounds normal, no murmur or gallops, no peripheral edema  Musculoskeletal: No deformities, no cyanosis or clubbing  Neuro: alert, awake, non focal  Skin: Warm, no lesions or rash      Assessment & Plan:  Chronic cough His cough has improved with the increase in his fluticasone to twice daily, cough suppression with Tessalon and Hycodan.  We also started pantoprazole but there was a delay in initiating this and unsure whether GERD was a big contributor to sustained cough.  Will attempt to wean some of these medications to see if he can tolerate.  Given his chronic rhinitis, active allergy at this time would not stop the fluticasone at least for now.  Based on the improvement suspect that the rhinitis was sustaining his cough.  The questions still remain regarding the patch she groundglass and interstitial changes bilaterally, whether he may have asthma.  Pulmonary function testing has been ordered to evaluate for airflow obstruction and will be done in May.  ILD (interstitial lung disease) Bilateral base predominant peripheral patchy groundglass/interstitial change with some associated traction bronchiectasis and early scar formation noted on CT chest.  Pattern often seen as a sequela of COVID-19 infection although he does not have any documented history of COVID-19.  Question whether an undiagnosed mild case may have precipitated his cough in the beginning of this illness.  Discussed with him the differential diagnosis for his pulmonary infiltrates including autoimmune disease (again undiagnosed), opportunistic infection.  I will check screening autoimmune labs.  Discussed the pros and cons of bronchoscopy with BAL for culture and cell count, transbronchial biopsies.  He is going to think about this and let me know if he wants to schedule.   Wayden Schwertner, MD, PhD 10/27/2022, 11:25 AM [facility] [phone] or if no answer before 7:00PM call [phone] For any issues after 7:00PM please call eLink [phone]   

## 2022-10-27 NOTE — Telephone Encounter (Addendum)
Referral has not been placed for a bronch for this pt as of yet.

## 2022-10-27 NOTE — Telephone Encounter (Signed)
Bronchoscopy referral placed

## 2022-10-27 NOTE — Assessment & Plan Note (Signed)
His cough has improved with the increase in his fluticasone to twice daily, cough suppression with Tessalon and Hycodan.  We also started pantoprazole but there was a delay in initiating this and unsure whether GERD was a big contributor to sustained cough.  Will attempt to wean some of these medications to see if he can tolerate.  Given his chronic rhinitis, active allergy at this time would not stop the fluticasone at least for now.  Based on the improvement suspect that the rhinitis was sustaining his cough.  The questions still remain regarding the patch she groundglass and interstitial changes bilaterally, whether he may have asthma.  Pulmonary function testing has been ordered to evaluate for airflow obstruction and will be done in May.

## 2022-10-28 LAB — JO-1 ANTIBODY-IGG: Jo-1 Autoabs: <1 AI

## 2022-10-28 LAB — TIQ-NTM

## 2022-10-28 LAB — SJOGREN'S SYNDROME ANTIBODS(SSA + SSB): SSA (Ro) (ENA) Antibody, IgG: >8 AI — AB

## 2022-10-29 LAB — SJOGREN'S SYNDROME ANTIBODS(SSA + SSB): SSB (La) (ENA) Antibody, IgG: 1.2 AI — AB

## 2022-10-30 ENCOUNTER — Encounter: Payer: Self-pay | Admitting: Emergency Medicine

## 2022-10-30 ENCOUNTER — Other Ambulatory Visit: Payer: Self-pay

## 2022-10-30 DIAGNOSIS — Z1152 Encounter for screening for COVID-19: Secondary | ICD-10-CM

## 2022-10-30 LAB — ALDOLASE: Aldolase: 3.7 U/L (ref <=8.1)

## 2022-10-30 LAB — ANA,IFA RA DIAG PNL W/RFLX TIT/PATN: Anti Nuclear Antibody (ANA): POSITIVE — AB

## 2022-10-30 LAB — ANTI-SCLERODERMA ANTIBODY: Scleroderma (Scl-70) (ENA) Antibody, IgG: <1 AI

## 2022-10-30 LAB — ANTI-NUCLEAR AB-TITER (ANA TITER)

## 2022-10-31 ENCOUNTER — Telehealth: Payer: Self-pay | Admitting: Emergency Medicine

## 2022-10-31 DIAGNOSIS — J849 Interstitial pulmonary disease, unspecified: Secondary | ICD-10-CM

## 2022-10-31 NOTE — Telephone Encounter (Signed)
Called to review auto-immune labs with the patient. Left him a message, will try him back.

## 2022-11-01 LAB — ANA,IFA RA DIAG PNL W/RFLX TIT/PATN
Cyclic Citrullin Peptide Ab: <16 UNITS
Rheumatoid fact SerPl-aCnc: 12 IU/mL (ref <14)

## 2022-11-01 LAB — ANCA SCREEN W REFLEX TITER: ANCA SCREEN: NEGATIVE

## 2022-11-01 LAB — ANTI-NUCLEAR AB-TITER (ANA TITER)
ANA TITER: 1:640 {titer} — ABNORMAL HIGH
ANA Titer 1: 1:80 {titer} — ABNORMAL HIGH

## 2022-11-01 LAB — TIQ-NTM

## 2022-11-01 LAB — ANTI-DNA ANTIBODY, DOUBLE-STRANDED: ds DNA Ab: 2 IU/mL

## 2022-11-01 NOTE — Telephone Encounter (Signed)
Pt. Calling back in to speak to the Dr. About his results

## 2022-11-01 NOTE — Telephone Encounter (Signed)
Reviewed lab work with the patient.  His SSA, SSB and ANA are all positive.  Suspect that this is related to the interstitial changes on CT chest, cough, dyspnea.  He has a bronchoscopy scheduled for next week and we will do culture data, transbronchial biopsies.  I would like for him to be seen by rheumatology and also in our [facility] clinic to follow-up and decide about immunosuppression.  Suspect he will need CellCept, question other immunosuppression as well.

## 2022-11-02 ENCOUNTER — Other Ambulatory Visit: Payer: BC Managed Care – PPO

## 2022-11-02 ENCOUNTER — Encounter (HOSPITAL_COMMUNITY): Payer: Self-pay | Admitting: Emergency Medicine

## 2022-11-02 DIAGNOSIS — Z01812 Encounter for preprocedural laboratory examination: Secondary | ICD-10-CM | POA: Diagnosis not present

## 2022-11-02 DIAGNOSIS — Z1152 Encounter for screening for COVID-19: Secondary | ICD-10-CM | POA: Diagnosis not present

## 2022-11-02 LAB — RNP ANTIBODIES: ENA RNP Ab: <0.2 AI (ref 0.0–0.9)

## 2022-11-02 LAB — HYPERSENSITIVITY PNEUMONITIS
A. Pullulans Abs: NEGATIVE
A.Fumigatus #1 Abs: NEGATIVE
Micropolyspora faeni, IgG: NEGATIVE
Pigeon Serum Abs: NEGATIVE
Thermoact. Saccharii: NEGATIVE
Thermoactinomyces vulgaris, IgG: NEGATIVE

## 2022-11-02 NOTE — Progress Notes (Signed)
PCP - Dr Julieanne Manson Cardiologist - n/a  CT Chest x-ray - 10/18/22 EKG - n/a Stress Test - n/a ECHO - n/a Cardiac Cath - n/a  ICD Pacemaker/Loop - n/a  Sleep Study -  No CPAP - none  Diabetes - n/a  NPO  STOP now taking any Aspirin (unless otherwise instructed by your surgeon), Aleve, Naproxen, Ibuprofen, Motrin, Advil, Goody's, BC's, all herbal medications, fish oil, and all vitamins.   Coronavirus Screening Do you have any of the following symptoms:  Cough Yes Fever (>100.35F)  yes/no: No Runny nose yes/no: No Sore throat yes/no: No Difficulty breathing/shortness of breath  Yes  Have you traveled in the last 14 days and where? yes/no: No  Patient verbalized understanding of instructions that were given via phone.

## 2022-11-03 ENCOUNTER — Encounter (HOSPITAL_COMMUNITY): Payer: Self-pay | Admitting: Emergency Medicine

## 2022-11-04 LAB — NOVEL CORONAVIRUS, NAA: SARS-CoV-2, NAA: NOT DETECTED

## 2022-11-06 ENCOUNTER — Ambulatory Visit (HOSPITAL_COMMUNITY): Payer: BC Managed Care – PPO | Admitting: Anesthesiology

## 2022-11-06 ENCOUNTER — Ambulatory Visit (HOSPITAL_COMMUNITY)
Admission: RE | Admit: 2022-11-06 | Discharge: 2022-11-06 | Disposition: A | Discharge status: Home / Self Care | Payer: BC Managed Care – PPO | Attending: Emergency Medicine | Admitting: Emergency Medicine

## 2022-11-06 ENCOUNTER — Encounter (HOSPITAL_COMMUNITY)
Admission: RE | Disposition: A | Discharge status: Home / Self Care | Payer: Self-pay | Source: Home / Self Care | Attending: Emergency Medicine

## 2022-11-06 ENCOUNTER — Ambulatory Visit (HOSPITAL_COMMUNITY): Payer: BC Managed Care – PPO

## 2022-11-06 ENCOUNTER — Other Ambulatory Visit: Payer: Self-pay

## 2022-11-06 ENCOUNTER — Encounter (HOSPITAL_COMMUNITY): Payer: Self-pay | Admitting: Emergency Medicine

## 2022-11-06 DIAGNOSIS — J849 Interstitial pulmonary disease, unspecified: Secondary | ICD-10-CM | POA: Diagnosis not present

## 2022-11-06 DIAGNOSIS — R918 Other nonspecific abnormal finding of lung field: Secondary | ICD-10-CM | POA: Insufficient documentation

## 2022-11-06 DIAGNOSIS — J45990 Exercise induced bronchospasm: Secondary | ICD-10-CM | POA: Insufficient documentation

## 2022-11-06 DIAGNOSIS — J302 Other seasonal allergic rhinitis: Secondary | ICD-10-CM

## 2022-11-06 DIAGNOSIS — Z0389 Encounter for observation for other suspected diseases and conditions ruled out: Secondary | ICD-10-CM | POA: Diagnosis not present

## 2022-11-06 DIAGNOSIS — R053 Chronic cough: Secondary | ICD-10-CM

## 2022-11-06 DIAGNOSIS — K219 Gastro-esophageal reflux disease without esophagitis: Secondary | ICD-10-CM | POA: Insufficient documentation

## 2022-11-06 DIAGNOSIS — Z8719 Personal history of other diseases of the digestive system: Secondary | ICD-10-CM | POA: Insufficient documentation

## 2022-11-06 HISTORY — DX: Gastro-esophageal reflux disease without esophagitis: K21.9

## 2022-11-06 HISTORY — DX: Unspecified asthma, uncomplicated: J45.909

## 2022-11-06 HISTORY — DX: Hyperlipidemia, unspecified: E78.5

## 2022-11-06 HISTORY — PX: BRONCHIAL WASHINGS: SHX5105

## 2022-11-06 HISTORY — DX: Respiratory tuberculosis unspecified: A15.9

## 2022-11-06 HISTORY — PX: BRONCHIAL BIOPSY: SHX5109

## 2022-11-06 HISTORY — DX: Pneumonia, unspecified organism: J18.9

## 2022-11-06 HISTORY — DX: Other seasonal allergic rhinitis: J30.2

## 2022-11-06 HISTORY — PX: VIDEO BRONCHOSCOPY: SHX5072

## 2022-11-06 LAB — BODY FLUID CELL COUNT WITH DIFFERENTIAL
Eos, Fluid: 2 %
Lymphs, Fluid: 55 %
Monocyte-Macrophage-Serous Fluid: 37 % — ABNORMAL LOW (ref 50–90)
Neutrophil Count, Fluid: 6 % (ref 0–25)
Total Nucleated Cell Count, Fluid: 106 cu mm (ref 0–1000)

## 2022-11-06 LAB — CBC
HCT: 40.2 % (ref 39.0–52.0)
Hemoglobin: 14.4 g/dL (ref 13.0–17.0)
MCH: 33.1 pg (ref 26.0–34.0)
MCHC: 35.8 g/dL (ref 30.0–36.0)
MCV: 92.4 fL (ref 80.0–100.0)
Platelets: 207 10*3/uL (ref 150–400)
RBC: 4.35 MIL/uL (ref 4.22–5.81)
RDW: 12.8 % (ref 11.5–15.5)
WBC: 4 10*3/uL (ref 4.0–10.5)
nRBC: 0 % (ref 0.0–0.2)

## 2022-11-06 LAB — CULTURE, BAL-QUANTITATIVE W GRAM STAIN

## 2022-11-06 LAB — AEROBIC/ANAEROBIC CULTURE W GRAM STAIN (SURGICAL/DEEP WOUND)

## 2022-11-06 SURGERY — BRONCHOSCOPY, WITH FLUOROSCOPY
Anesthesia: General | Laterality: Bilateral

## 2022-11-06 MED ORDER — CHLORHEXIDINE GLUCONATE 0.12 % MT SOLN
15.0000 mL | Freq: Once | OROMUCOSAL | Status: AC
  Filled 2022-11-06: qty 15

## 2022-11-06 MED ORDER — PROPOFOL 500 MG/50ML IV EMUL
INTRAVENOUS | Status: DC | PRN
  Administered 2022-11-06: 125 ug/kg/min via INTRAVENOUS

## 2022-11-06 MED ORDER — AZELASTINE HCL 0.1 % NA SOLN: 2.0000 | Freq: Every morning | NASAL | Status: DC

## 2022-11-06 MED ORDER — CHLORHEXIDINE GLUCONATE 0.12 % MT SOLN
OROMUCOSAL | Status: AC
  Administered 2022-11-06: 15 mL via OROMUCOSAL
  Filled 2022-11-06: qty 15

## 2022-11-06 MED ORDER — FENTANYL CITRATE (PF) 100 MCG/2ML IJ SOLN: 25.0000 ug | INTRAMUSCULAR | Status: DC | PRN

## 2022-11-06 MED ORDER — LACTATED RINGERS IV SOLN: INTRAVENOUS | Status: DC

## 2022-11-06 MED ORDER — DEXAMETHASONE SODIUM PHOSPHATE 10 MG/ML IJ SOLN
INTRAMUSCULAR | Status: DC | PRN
  Administered 2022-11-06: 10 mg via INTRAVENOUS

## 2022-11-06 MED ORDER — PROPOFOL 10 MG/ML IV BOLUS
INTRAVENOUS | Status: DC | PRN
  Administered 2022-11-06: 140 mg via INTRAVENOUS

## 2022-11-06 MED ORDER — FENTANYL CITRATE (PF) 250 MCG/5ML IJ SOLN
INTRAMUSCULAR | Status: DC | PRN
  Administered 2022-11-06 (×2): 50 ug via INTRAVENOUS

## 2022-11-06 MED ORDER — ONDANSETRON HCL 4 MG/2ML IJ SOLN
INTRAMUSCULAR | Status: DC | PRN
  Administered 2022-11-06: 4 mg via INTRAVENOUS

## 2022-11-06 MED ORDER — FLUTICASONE PROPIONATE 50 MCG/ACT NA SUSP: 2.0000 | Freq: Two times a day (BID) | NASAL | Status: AC

## 2022-11-06 MED ORDER — ACETAMINOPHEN 10 MG/ML IV SOLN: 1000.0000 mg | Freq: Once | INTRAVENOUS | Status: DC | PRN

## 2022-11-06 MED ORDER — ACETAMINOPHEN 500 MG PO TABS: 1000.0000 mg | ORAL_TABLET | Freq: Once | ORAL | Status: DC | PRN

## 2022-11-06 MED ORDER — ROCURONIUM BROMIDE 10 MG/ML (PF) SYRINGE
PREFILLED_SYRINGE | INTRAVENOUS | Status: DC | PRN
  Administered 2022-11-06: 50 mg via INTRAVENOUS

## 2022-11-06 MED ORDER — LIDOCAINE 2% (20 MG/ML) 5 ML SYRINGE
INTRAMUSCULAR | Status: DC | PRN
  Administered 2022-11-06: 80 mg via INTRAVENOUS

## 2022-11-06 MED ORDER — FENTANYL CITRATE (PF) 100 MCG/2ML IJ SOLN
INTRAMUSCULAR | Status: AC
  Filled 2022-11-06: qty 2

## 2022-11-06 MED ORDER — MIDAZOLAM HCL 5 MG/5ML IJ SOLN
INTRAMUSCULAR | Status: DC | PRN
  Administered 2022-11-06: 2 mg via INTRAVENOUS

## 2022-11-06 MED ORDER — MIDAZOLAM HCL 2 MG/2ML IJ SOLN
INTRAMUSCULAR | Status: AC
  Filled 2022-11-06: qty 2

## 2022-11-06 MED ORDER — ACETAMINOPHEN 160 MG/5ML PO SOLN: 1000.0000 mg | Freq: Once | ORAL | Status: DC | PRN

## 2022-11-06 MED ORDER — SUGAMMADEX SODIUM 200 MG/2ML IV SOLN
INTRAVENOUS | Status: DC | PRN
  Administered 2022-11-06: 200 mg via INTRAVENOUS

## 2022-11-06 NOTE — Anesthesia Preprocedure Evaluation (Signed)
Anesthesia Evaluation  Patient identified by MRN, date of birth, ID band Patient awake    Reviewed: Allergy & Precautions, NPO status , Patient's Chart, lab work & pertinent test results  History of Anesthesia Complications Negative for: history of anesthetic complications  Airway Mallampati: II  TM Distance: >3 FB Neck ROM: Full    Dental  (+) Teeth Intact, Dental Advisory Given   Pulmonary asthma    breath sounds clear to auscultation       Cardiovascular negative cardio ROS  Rhythm:Regular     Neuro/Psych  PSYCHIATRIC DISORDERS Anxiety      Neuromuscular disease    GI/Hepatic Neg liver ROS, hiatal hernia,GERD  Medicated and Controlled,,  Endo/Other  negative endocrine ROS    Renal/GU negative Renal ROS     Musculoskeletal negative musculoskeletal ROS (+)    Abdominal   Peds  Hematology negative hematology ROS (+)   Anesthesia Other Findings   Reproductive/Obstetrics                              Anesthesia Physical Anesthesia Plan  ASA: 2  Anesthesia Plan: General   Post-op Pain Management: Minimal or no pain anticipated   Induction: Intravenous  PONV Risk Score and Plan: 2 and Ondansetron and Propofol infusion  Airway Management Planned: Oral ETT  Additional Equipment: None  Intra-op Plan:   Post-operative Plan: Extubation in OR  Informed Consent: I have reviewed the patients History and Physical, chart, labs and discussed the procedure including the risks, benefits and alternatives for the proposed anesthesia with the patient or authorized representative who has indicated his/her understanding and acceptance.     Dental advisory given  Plan Discussed with: CRNA  Anesthesia Plan Comments:          Anesthesia Quick Evaluation

## 2022-11-06 NOTE — Discharge Instructions (Signed)
Flexible Bronchoscopy, Care After This sheet gives you information about how to care for yourself after your test. Your doctor may also give you more specific instructions. If you have problems or questions, contact your doctor. Follow these instructions at home: Eating and drinking When your numbness is gone and your cough and gag reflexes have come back, you may: Eat only soft foods. Slowly drink liquids. The day after the test, go back to your normal diet. Driving Do not drive for 24 hours if you were given a medicine to help you relax (sedative). Do not drive or use heavy machinery while taking prescription pain medicine. General instructions  Take over-the-counter and prescription medicines only as told by your doctor. Return to your normal activities as told. Ask what activities are safe for you. Do not use any products that have nicotine or tobacco in them. This includes cigarettes and e-cigarettes. If you need help quitting, ask your doctor. Keep all follow-up visits as told by your doctor. This is important. It is very important if you had a tissue sample (biopsy) taken. Get help right away if: You have shortness of breath that gets worse. You get light-headed. You feel like you are going to pass out (faint). You have chest pain. You cough up: More than a little blood. More blood than before. Summary Do not eat or drink anything (not even water) for 2 hours after your test, or until your numbing medicine wears off. Do not use cigarettes. Do not use e-cigarettes. Get help right away if you have chest pain.  Please call our office for any questions or concerns.  [phone].  This information is not intended to replace advice given to you by your health care provider. Make sure you discuss any questions you have with your health care provider. Document Released: 04/23/2009 Document Revised: 06/08/2017 Document Reviewed: 07/14/2016 Elsevier Patient Education  2020 [employer]  Inc[employer]  

## 2022-11-06 NOTE — Transfer of Care (Signed)
Immediate Anesthesia Transfer of Care Note  Patient: Joseph Hendricks  Procedure(s) Performed: VIDEO BRONCHOSCOPY WITH FLUORO (Bilateral) BRONCHIAL BIOPSIES BRONCHIAL WASHINGS  Patient Location: PACU  Anesthesia Type:General  Level of Consciousness: awake, alert , and oriented  Airway & Oxygen Therapy: Patient Spontanous Breathing  Post-op Assessment: Report given to RN, Post -op Vital signs reviewed and stable, and Patient moving all extremities X 4  Post vital signs: Reviewed and stable  Last Vitals:  Vitals Value Taken Time  BP 121/89 11/06/22 0815  Temp 36.6 C 11/06/22 0815  Pulse 81 11/06/22 0823  Resp 19 11/06/22 0823  SpO2 94 % 11/06/22 0823  Vitals shown include unvalidated device data.  Last Pain:  Vitals:   11/06/22 0815  TempSrc:   PainSc: 0-No pain         Complications: No notable events documented.

## 2022-11-06 NOTE — Interval H&P Note (Signed)
History and Physical Interval Note:  11/06/2022 7:24 AM  Joseph Hendricks  has presented today for surgery, with the diagnosis of Bilateral pulmonary infiltrate.  The various methods of treatment have been discussed with the patient and family. After consideration of risks, benefits and other options for treatment, the patient has consented to  Procedure(s): VIDEO BRONCHOSCOPY WITH FLUORO (Bilateral) as a surgical intervention.  The patient's history has been reviewed, patient examined, no change in status, stable for surgery.  I have reviewed the patient's chart and labs.  Questions were answered to the patient's satisfaction.     Leslye Peer

## 2022-11-06 NOTE — Op Note (Signed)
Video Bronchoscopy Procedure Note  Date of Operation: 11/06/2022  Pre-op Diagnosis: Bilateral pulmonary infiltrates  Post-op Diagnosis: Same  Surgeon: Levy Pupa  Assistants: none  Anesthesia: General  Operation: Flexible video fiberoptic bronchoscopy and biopsies.  Estimated Blood Loss: 0 cc  Complications: none noted  Indications and History: Joseph Hendricks is 57 y.o. with history of cough.  Found to have bilateral pulmonary infiltrates on chest x-ray and CT chest.  Recommendation was to perform video fiberoptic bronchoscopy with BAL and biopsies. The risks, benefits, complications, treatment options and expected outcomes were discussed with the patient.  The possibilities of pneumothorax, pneumonia, reaction to medication, pulmonary aspiration, perforation of a viscus, bleeding, failure to diagnose a condition and creating a complication requiring transfusion or operation were discussed with the patient who freely signed the consent.    Description of Procedure: The patient was seen in the Preoperative Area, was examined and was deemed appropriate to proceed.  The patient was taken to [facility] endoscopy room 3, identified as Joseph Hendricks and the procedure verified as Flexible Video Fiberoptic Bronchoscopy.  A Time Out was held and the above information confirmed.   General anesthesia was initiated and the patient was endotracheally intubated.  The video fiberoptic bronchoscope was introduced via the ET tube and a general inspection was performed which showed normal distal trachea, normal main carina. The R sided airways were inspected and showed normal RUL, BI, RML and RLL. The L side was then inspected. The LLL, Lingular and LUL airways were normal.   Bronchoalveolar lavage was performed in the superior segment of the right lower lobe with 90 cc normal saline instilled and approximately 25 cc returned.  This will be sent for cell count differential, cytology, routine, fungal  and AFB smears and cultures.  Under fluoroscopic guidance transbronchial biopsies were performed in the right lower lobe.  These will be sent for pathology.  The patient tolerated the procedure well. The bronchoscope was removed. There were no obvious complications.   Samples: 1.  Bronchoalveolar lavage from the superior segment of the right lower lobe 2.  Transbronchial forceps biopsies from the right lower lobe   Plans:  We will review the cytology, pathology and microbiology results with the patient when they become available.  Outpatient followup will be with Dr Delton Coombes.    Levy Pupa, MD, PhD 11/06/2022, 8:01 AM [facility] [phone] or if no answer [phone]

## 2022-11-06 NOTE — Anesthesia Postprocedure Evaluation (Signed)
Anesthesia Post Note  Patient: VONNIE LIGMAN  Procedure(s) Performed: VIDEO BRONCHOSCOPY WITH FLUORO (Bilateral) BRONCHIAL BIOPSIES BRONCHIAL WASHINGS     Patient location during evaluation: PACU Anesthesia Type: General Level of consciousness: awake and alert Pain management: pain level controlled Vital Signs Assessment: post-procedure vital signs reviewed and stable Respiratory status: spontaneous breathing, nonlabored ventilation, respiratory function stable and patient connected to nasal cannula oxygen Cardiovascular status: blood pressure returned to baseline and stable Postop Assessment: no apparent nausea or vomiting Anesthetic complications: no   No notable events documented.  Last Vitals:  Vitals:   11/06/22 0830 11/06/22 0845  BP: (!) 129/90 117/86  Pulse: 75 63  Resp: 17 18  Temp:  (!) 36.3 C  SpO2: 94% 96%    Last Pain:  Vitals:   11/06/22 0845  TempSrc:   PainSc: 0-No pain                 Yaira Bernardi

## 2022-11-06 NOTE — Anesthesia Procedure Notes (Signed)
Procedure Name: Intubation Date/Time: 11/06/2022 7:40 AM  Performed by: Quentin Ore, CRNAPre-anesthesia Checklist: Patient identified, Emergency Drugs available, Suction available and Patient being monitored Patient Re-evaluated:Patient Re-evaluated prior to induction Oxygen Delivery Method: Circle system utilized Preoxygenation: Pre-oxygenation with 100% oxygen Induction Type: IV induction Ventilation: Mask ventilation without difficulty Laryngoscope Size: Mac and 4 Grade View: Grade I Tube type: Oral Tube size: 8.5 mm Number of attempts: 1 Airway Equipment and Method: Stylet Placement Confirmation: ETT inserted through vocal cords under direct vision, positive ETCO2 and breath sounds checked- equal and bilateral Secured at: 22 cm Tube secured with: Tape Dental Injury: Teeth and Oropharynx as per pre-operative assessment

## 2022-11-07 LAB — ACID FAST SMEAR (AFB, MYCOBACTERIA): Acid Fast Smear: NEGATIVE

## 2022-11-07 LAB — SURGICAL PATHOLOGY

## 2022-11-07 LAB — CYTOLOGY - NON PAP

## 2022-11-08 ENCOUNTER — Telehealth: Payer: Self-pay | Admitting: Emergency Medicine

## 2022-11-08 ENCOUNTER — Encounter (HOSPITAL_COMMUNITY): Payer: Self-pay | Admitting: Emergency Medicine

## 2022-11-08 LAB — CULTURE, BAL-QUANTITATIVE W GRAM STAIN

## 2022-11-08 LAB — AEROBIC/ANAEROBIC CULTURE W GRAM STAIN (SURGICAL/DEEP WOUND)

## 2022-11-08 NOTE — Telephone Encounter (Signed)
Spoke with the patient, reviewed his cytology and pathology results.  Unremarkable, negative.  His smears are negative, cultures pending.  He has an appointment to see Dr. Corliss Skains with rheumatology at the end of September  Please confirm that the patient is going to be seen as a new consult by our ILD clinic, either Dr. Isaiah Serge or Dr. Marchelle Gearing.  Thank you

## 2022-11-09 LAB — FUNGUS CULTURE WITH STAIN

## 2022-11-09 LAB — FUNGUS CULTURE RESULT

## 2022-11-10 LAB — AEROBIC/ANAEROBIC CULTURE W GRAM STAIN (SURGICAL/DEEP WOUND)

## 2022-11-11 LAB — AEROBIC/ANAEROBIC CULTURE W GRAM STAIN (SURGICAL/DEEP WOUND)

## 2022-11-12 LAB — MYOMARKER 3 PLUS PROFILE (RDL)
Anti-EJ Ab (RDL): NEGATIVE
Anti-Jo-1 Ab (RDL): <20 Units (ref <20)
Anti-Ku Ab (RDL): POSITIVE — AB
Anti-MDA-5 Ab (CADM-140)(RDL): <20 Units (ref <20)
Anti-Mi-2 Ab (RDL): NEGATIVE
Anti-NXP-2 (P140) Ab (RDL): <20 Units (ref <20)
Anti-OJ Ab (RDL): NEGATIVE
Anti-PL-12 Ab (RDL: NEGATIVE
Anti-PL-7 Ab (RDL): NEGATIVE
Anti-PM/Scl-100 Ab (RDL): <20 Units (ref <20)
Anti-SAE1 Ab, IgG (RDL): <20 Units (ref <20)
Anti-SRP Ab (RDL): NEGATIVE
Anti-SS-A 52kD Ab, IgG (RDL): >200 Units — ABNORMAL HIGH (ref <20)
Anti-TIF-1gamma Ab (RDL): <20 Units (ref <20)
Anti-U1 RNP Ab (RDL): <20 Units (ref <20)
Anti-U2 RNP Ab (RDL): NEGATIVE
Anti-U3 RNP (Fibrillarin)(RDL): NEGATIVE

## 2022-11-14 NOTE — Telephone Encounter (Signed)
Pt is scheduled to have PFT 5/28 and then scheduled to have an OV with Dr. Delton Coombes 5/29.   Pt is also scheduled for a new pt appointment with Dr. Marchelle Gearing on 6/11.

## 2022-11-14 NOTE — Telephone Encounter (Signed)
Thank you :)

## 2022-12-01 ENCOUNTER — Telehealth: Payer: Self-pay | Admitting: Emergency Medicine

## 2022-12-01 NOTE — Telephone Encounter (Signed)
PT last saw Dr. Delton Coombes and it was determined he needed to be referred to Dr. Elvera Lennox for ILD.   PT has used an inhaler (Albuterol) in the past, has one now. (Inhaler was not RX's by [place] but a previous Dr. ) but because of the diagnosis change he wonders if it will still of benefit to him and can he use the one he has should he become SOB in the days to come.   Pls call to advise. [phone]

## 2022-12-01 NOTE — Telephone Encounter (Signed)
Called and spoke with pt letting him know that he can use his albuterol inhaler if needed for SOB and he verbalized understanding. Nothing further needed.

## 2022-12-05 ENCOUNTER — Ambulatory Visit (INDEPENDENT_AMBULATORY_CARE_PROVIDER_SITE_OTHER): Payer: BC Managed Care – PPO | Admitting: Internal Medicine

## 2022-12-05 DIAGNOSIS — R059 Cough, unspecified: Secondary | ICD-10-CM

## 2022-12-05 LAB — PULMONARY FUNCTION TEST
DL/VA % pred: 95 %
DL/VA: 4.09 ml/min/mmHg/L
DLCO cor % pred: 71 %
DLCO cor: 19.95 ml/min/mmHg
DLCO unc % pred: 70 %
DLCO unc: 19.84 ml/min/mmHg
FEF 25-75 Post: 3.27 L/sec
FEF 25-75 Pre: 2.84 L/sec
FEF2575-%Change-Post: 15 %
FEF2575-%Pred-Post: 106 %
FEF2575-%Pred-Pre: 92 %
FEV1-%Change-Post: 4 %
FEV1-%Pred-Post: 78 %
FEV1-%Pred-Pre: 75 %
FEV1-Post: 2.89 L
FEV1-Pre: 2.77 L
FEV1FVC-%Change-Post: 2 %
FEV1FVC-%Pred-Pre: 107 %
FEV6-%Change-Post: 1 %
FEV6-%Pred-Post: 74 %
FEV6-%Pred-Pre: 73 %
FEV6-Post: 3.44 L
FEV6-Pre: 3.41 L
FEV6FVC-%Pred-Post: 104 %
FEV6FVC-%Pred-Pre: 104 %
FVC-%Change-Post: 1 %
FVC-%Pred-Post: 71 %
FVC-%Pred-Pre: 70 %
FVC-Post: 3.44 L
FVC-Pre: 3.41 L
Post FEV1/FVC ratio: 84 %
Post FEV6/FVC ratio: 100 %
Pre FEV1/FVC ratio: 81 %
Pre FEV6/FVC Ratio: 100 %
RV % pred: 89 %
RV: 1.97 L
TLC % pred: 74 %
TLC: 5.24 L

## 2022-12-05 LAB — FUNGAL ORGANISM REFLEX

## 2022-12-05 LAB — FUNGUS CULTURE WITH STAIN

## 2022-12-05 NOTE — Progress Notes (Signed)
Full PFT performed today. °

## 2022-12-05 NOTE — Patient Instructions (Signed)
Full PFT performed today. °

## 2022-12-06 ENCOUNTER — Ambulatory Visit: Payer: BC Managed Care – PPO | Admitting: Emergency Medicine

## 2022-12-19 ENCOUNTER — Encounter: Payer: Self-pay | Admitting: Internal Medicine

## 2022-12-19 ENCOUNTER — Ambulatory Visit: Payer: Self-pay | Admitting: Internal Medicine

## 2022-12-19 ENCOUNTER — Other Ambulatory Visit: Payer: BC Managed Care – PPO

## 2022-12-19 VITALS — BP 120/80 | HR 80 | Ht 70.0 in | Wt 197.6 lb

## 2022-12-19 DIAGNOSIS — E559 Vitamin D deficiency, unspecified: Secondary | ICD-10-CM

## 2022-12-19 DIAGNOSIS — R682 Dry mouth, unspecified: Secondary | ICD-10-CM | POA: Diagnosis not present

## 2022-12-19 DIAGNOSIS — Z79899 Other long term (current) drug therapy: Secondary | ICD-10-CM

## 2022-12-19 DIAGNOSIS — D849 Immunodeficiency, unspecified: Secondary | ICD-10-CM

## 2022-12-19 DIAGNOSIS — J849 Interstitial pulmonary disease, unspecified: Secondary | ICD-10-CM

## 2022-12-19 DIAGNOSIS — D72820 Lymphocytosis (symptomatic): Secondary | ICD-10-CM | POA: Diagnosis not present

## 2022-12-19 DIAGNOSIS — R768 Other specified abnormal immunological findings in serum: Secondary | ICD-10-CM

## 2022-12-19 DIAGNOSIS — M3502 Sicca syndrome with lung involvement: Secondary | ICD-10-CM

## 2022-12-19 DIAGNOSIS — I7121 Aneurysm of the ascending aorta, without rupture: Secondary | ICD-10-CM

## 2022-12-19 LAB — CBC WITH DIFFERENTIAL/PLATELET
Basophils Absolute: 0 10*3/uL (ref 0.0–0.1)
Basophils Relative: 0.8 % (ref 0.0–3.0)
Eosinophils Absolute: 0.1 10*3/uL (ref 0.0–0.7)
Eosinophils Relative: 1.8 % (ref 0.0–5.0)
HCT: 43.1 % (ref 39.0–52.0)
Hemoglobin: 14.7 g/dL (ref 13.0–17.0)
Lymphocytes Relative: 22.8 % (ref 12.0–46.0)
Lymphs Abs: 0.9 10*3/uL (ref 0.7–4.0)
MCHC: 34.2 g/dL (ref 30.0–36.0)
MCV: 94.3 fl (ref 78.0–100.0)
Monocytes Absolute: 0.4 10*3/uL (ref 0.1–1.0)
Monocytes Relative: 10.6 % (ref 3.0–12.0)
Neutro Abs: 2.4 10*3/uL (ref 1.4–7.7)
Neutrophils Relative %: 64 % (ref 43.0–77.0)
Platelets: 209 10*3/uL (ref 150.0–400.0)
RBC: 4.57 Mil/uL (ref 4.22–5.81)
RDW: 13.3 % (ref 11.5–15.5)
WBC: 3.8 10*3/uL — ABNORMAL LOW (ref 4.0–10.5)

## 2022-12-19 LAB — SEDIMENTATION RATE: Sed Rate: 16 mm/hr (ref 0–20)

## 2022-12-19 LAB — HEMOGLOBIN A1C: Hgb A1c MFr Bld: 5.2 % (ref 4.6–6.5)

## 2022-12-19 MED ORDER — PREDNISONE 10 MG PO TABS
ORAL_TABLET | ORAL | 0 refills | Status: DC
Start: 2022-12-19 — End: 2022-12-25

## 2022-12-19 NOTE — Patient Instructions (Addendum)
ICD-10-CM   1. ILD (interstitial lung disease) (HCC)  J84.9 ANA+ENA+DNA/DS+Scl 70+SjoSSA/B    CBC w/Diff    Sed Rate (ESR)    QuantiFERON-TB Gold Plus    VITAMIN D 25 Hydroxy (Vit-D Deficiency, Fractures)    Glucose 6 phosphate dehydrogenase    Thiopurine methyltransferase(tpmt)rbc    HepB+HepC+HIV Panel    Hemoglobin A1c    DISCONTINUED: predniSONE (DELTASONE) 10 MG tablet    CANCELED: Ambulatory referral to Rheumatology    CANCELED: Hemoglobin A1c    2. Sjogren syndrome with lung involvement (HCC)  M35.02     3. High risk medication use  Z79.899     4. Immunosuppressed status (HCC)  D84.9     5. ANA positive  R76.8     6. Dry mouth  R68.2     7. Lymphocytosis  D72.820     8. Aneurysm of ascending aorta without rupture (HCC)  I71.21     9. Vitamin D deficiency  E55.9      ILD (interstitial lung disease) (HCC) ANA positive Dry mouth Lymphocytosis - bAL April 2024  - You have ILD  - Thre are many types to it -Currently it is unclear what type you have although clinically does not seem to be a common type called IPF -The presence of lymphocytes + t he Sjogren antibody + the dry mouth suggest that your condition could be related to Sjogren syndrome   Plan (based on shared decision making with you and my direct chat conversation with  Dr Pollyann Savoy rheumatologist)  -Check blood ANA, CK and aldolase today -Check CBC with differential, chemistry, and liver function test -Check blood ESR -Check blood QuantiFERON gold -Check blood vitamin D and hemoglobin A1c - Check blood G6PD and TPMT for safety labs -Check hepatitis virus panel  -Start prednisone as follows  -40 mg daily x 10 days  and then 30 mg daily x 10 days and then 20 mg daily x  10 days and then 10 mg/day to continue -Refer to outpatient pharmacy team [facility]   -To get CellCept started at 5 mg twice a day  -Rheumatology will try to get you in soon   Vaccine counseling  Plan  - flu shot in  fall - RSV vaccine - if you can pay out of pocket anytime next few months -Prevnar vaccine next few weeks/months  Follow-up - Video visit in 1-2 weeks to review and update;

## 2022-12-19 NOTE — Progress Notes (Addendum)
ROV 10/27/22 --Joseph Hendricks follows up today for his persistent cough, associated dyspnea.  He is 57, never smoker with GERD, allergic rhinitis, question exercise-induced asthma.  We initiated pantoprazole, increased his fluticasone nasal spray to twice a day, try Tessalon and Hycodan for cough suppression at his initial visit.  I also stopped his Asmanex in the absence of any definitive evidence for asthma.  He had a CT chest to evaluate infiltrates noted on chest x-ray as his illness evolved. His cough is significantly better - he thinks the nasal congestion was the biggest contributor, because there was a delay in getting his PPI started. Not needing as much tessalon or hycodan. He is having some increased nasal congestion now since the pollen increased.   CT chest 10/18/2022 reviewed by me shows no mediastinal or hilar adenopathy, some aortic calcification, bibasilar noncontiguous peripheral groundglass and interstitial thickening with some traction bronchiectasis, no overt honeycomb change.    OV 12/19/2022 -transfer of care to see Dr. Marchelle Gearing in the interstitial lung disease center.  History is gained from talking to the patient review of the records and also the ILD questionnaire.  Subjective:  Patient ID: Joseph Hendricks, male , DOB: 1965-10-14 , age 57 y.o. , MRN: [id number] , ADDRESS: [address] [place] [address] PCP Bosie Clos, MD Patient Care Team: Bosie Clos, MD as PCP - General (Family Medicine)  This Provider for this visit: Treatment Team:  Attending Provider: Kalman Shan, MD    12/19/2022 -   Chief Complaint  Patient presents with   Consult    Consult for possible ILD, CT scan, and bronch results.     HPI Joseph Hendricks 57 y.o. 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  He is related to Dr. Andrey Spearman the surgeon [his brother-in-law].  He tells me that he has been previously well but in January 2024 all of  a sudden he started with a cough that kept insidiously.  He is had 2 rounds of antibiotics and prednisone initially diagnosed also with pneumonia.  He felt he was really bad at the time and could not work.  The cough would get worse with sneezing and yawning.  He thought he might of had COVID at this time in January but no COVID test has been positive.  He subsequently saw Dr. Levy Pupa who controlled his cough with the help of acid reflux treatment.  He also subjective patient other treatment measures.  The cough gradually got away.  He has been stable to walk right now but he still gets fatigued and short of breath.  He says when he goes for his 2 mile walk 4 times a week he has to pause at the top.  This is not his baseline although it is better compared to earlier in the year when he was coughing significantly.  He says through this workup with Dr. Delton Coombes interstitial lung disease was diagnosed.  Then subsequently in April 2024 underwent bronchoscopy with lavage that shows 55% lymphocytes.  Which means BAL with lymphocytosis.  His autoimmune antibody panel is positive for SSA SSB and related myositis antibody.  His ANA is also trace positive.  At 1: 80 showing cytoplasmic pattern.  Repeat ANA on the same day shows 1: 640 with a nuclear speckled pattern.  His hypersensitive pneumonitis antibody panel is normal.  The CT scan he underwent for evaluation of the cough is a super D CT scan.  I personally visualized it.  I agree it is not consistent with UIP.  I personally thought it look like NSIP.  However Dr. Dorothey Baseman feels that is consistent with organizing pneumonia but we all agree that alternative pattern.  He is awaiting rheumatology consultation but is not until October 2024.  Secure chatted DR Corliss Skains who upon review of the chart feels patient's features are all consistent with Sjogren's syndrome.  [He does have a history of dry mouth].  Detail ILD question as below    [facility]  Comprehensive ILD Questionnaire  Symptoms:   SYMPTOM SCALE - ILD 12/19/2022  Current weight   O2 use ra  Shortness of Breath 0 -> 5 scale with 5 being worst (score 6 If unable to do)  At rest 0  Simple tasks - showers, clothes change, eating, shaving 0  Household (dishes, doing bed, laundry) 1.5  Shopping 0  Walking level at own pace 0  Walking up Stairs 2.5  Total (30-36) Dyspnea Score 4      Non-dyspnea symptoms (0-> 5 scale) 12/19/2022  How bad is your cough? 0  How bad is your fatigue 1.5  How bad is nausea 0  How bad is vomiting?  0  How bad is diarrhea? 0  How bad is anxiety? 1.5  How bad is depression 0  Any chronic pain - if so where and how bad no     Past Medical History :  -He has history of exercise-induced asthma for a few decades - Prior connective tissue diagnosis diagnosis is negative.  Although he is aware of his recent antibody panel positivity. - He has had COVID-vaccine but not formally the COVID disease although at the onset of the illness he thought he may have had COVID   ROS:  -Positive for fatigue for the last several months - Positive arthralgia for the last few years particularly in the fingers - She does have an occasional heartburn [previously had significant heartburn and he lost 25 pounds and his heartburn resolved].  He does occasionally snore -He does have dry mouth for years.  Is particularly worse in the morning.  But no dry hands  FAMILY HISTORY of LUNG DISEASE:  -Asthma in her brother but otherwise negative for family history of lung disease  PERSONAL EXPOSURE HISTORY:  -Smoked marijuana few x 35 to 40 years ago but otherwise no current marijuana use no tobacco use no no cocaine use no intravenous drug use  HOME  EXPOSURE and HOBBY DETAILS :  -[place]  Home was built in 1989.  He has lived there past 21 years.  Detail organic antigen exposure history in the house is negative.  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  All corporate finance related.  Detail organic and inorganic antigen exposure history at work or else was negative.  PULMONARY TOXICITY HISTORY (27 items):  -Other than short course of prednisone he has not had any drugs that can cause pulmonary toxicity.  INVESTIGATIONS: As below     Simple office walk 224 (66+46 x 2) feet Pod A at [facility] x  3 laps goal with forehead probe 12/19/2022    O2 used ra   Number laps completed Sit stand x 15   Comments about pace good   Resting Pulse Ox/HR 96% and 73/min   Final Pulse Ox/HR 97% and 104/min   Desaturated </= 88% no   Desaturated <= 3% points  no   Got Tachycardic >/= 90/min yes   Symptoms at end of test Mildd dypsne   Miscellaneous comments x     SUPER D CT 10/18/22 - personally visualzied, interpreted but agree with findings below -per email communication with Dr. Dorothey Baseman for second opinionm " This looks like organizing PNA to me.  Sjgren syndrome can present with an OP pattern.  Could also be due to some other insult like a drug reaction.    arrative & Impression  CLINICAL DATA:  Lung nodule. History of pneumonia with abnormal pulmonary function tests.   EXAM: CT CHEST WITHOUT CONTRAST   TECHNIQUE: Multidetector CT imaging of the chest was performed using thin slice collimation for electromagnetic bronchoscopy planning purposes, without intravenous contrast.   RADIATION DOSE REDUCTION: This exam was performed according to the departmental dose-optimization program which includes automated exposure control, adjustment of the mA and/or kV according to patient size and/or use of iterative reconstruction technique.   COMPARISON:  None Available.   FINDINGS: Cardiovascular: 07/31/2012.   Mediastinum/Nodes: Atherosclerotic calcification of the aorta. Ascending aorta measures 4.3 cm (5/51). Comparison with 07/31/2012 is challenging in the absence of  coronal reformatted images. Heart is enlarged. No pericardial effusion.   Lungs/Pleura: No pathologically enlarged mediastinal or axillary lymph nodes. Hilar regions are difficult to definitively evaluate without IV contrast. Esophagus is grossly unremarkable.   Upper Abdomen: Patchy peripheral and basilar coarsened ground-glass and slight consolidation with traction bronchiectasis, new from 07/31/2012. No pleural fluid. Airway is unremarkable.   Musculoskeletal: Visualized portions of the liver, gallbladder adrenal glands are unremarkable. Low-attenuation lesion in the right kidney. No specific follow-up necessary. Visualized portions of the kidneys, spleen, pancreas, stomach and bowel are otherwise grossly unremarkable. No upper abdominal adenopathy.   IMPRESSION: No worrisome lytic or sclerotic lesions.   1. Pulmonary parenchymal pattern of peripheral and basilar coarsened ground-glass, slight consolidation and traction bronchiectasis, new from 07/31/2012 and likely due to the sequelae of COVID-19 pneumonia. Findings are suggestive of an alternative diagnosis (not UIP) per consensus guidelines: Diagnosis of Idiopathic Pulmonary Fibrosis: An Official ATS/ERS/JRS/ALAT Clinical Practice Guideline. Am Rosezetta Schlatter Crit Care Med Vol 198, Iss 5, [id number], Mar 10 2017. 2. 4.3 cm ascending aortic aneurysm. Recommend annual imaging followup by CTA or MRA. This recommendation follows 2010 ACCF/AHA/AATS/ACR/ASA/SCA/SCAI/SIR/STS/SVM Guidelines for the Diagnosis and Management of Patients with Thoracic Aortic Disease. Circulation. 2010; 121: N629-B284. Aortic aneurysm NOS (ICD10-I71.9). 3.  Aortic atherosclerosis (ICD10-I70.0).     Electronically Signed   By: Leanna Battles M.D.   On: 10/19/2022 13:41     Latest Reference Range & Units 10/27/22 11:31  Anti-Jo-1 Ab (RDL) <20 Units <20  Anti-PL-7 Ab (RDL) Negative  Negative  Anti-PL-12 Ab (RDL) Negative  Negative  Anti-EJ Ab (RDL)  Negative  Negative  Anti-OJ Ab (RDL) Negative  Negative  Anti-SRP Ab (RDL) Negative  Negative  Anti-Mi-2 Ab (RDL) Negative  Negative  Anti-TIF-1gamma Ab (RDL) <20 Units <20  Anti-MDA-5 Ab (CADM-140)(RDL) <20 Units <20  Anti-NXP-2 (P140) Ab (RDL) <20 Units <20  Anti-SAE1 Ab, IgG (RDL) <20 Units <20  Anti-PM/Scl-100 Ab (RDL) <20 Units <20  Anti-Ku Ab (RDL) Negative  Weak Positive !  Anti-SS-A 52kD Ab, IgG (RDL) <20 Units >200 (H)  Anti-U1 RNP Ab (RDL) <20 Units <20  Anti-U2 RNP Ab (RDL) Negative  Negative  Anti-U3 RNP (Fibrillarin)(RDL) Negative  Negative  !: Data is abnormal (H): Data is abnormally high    Latest Reference Range & Units 05/06/21 07:28 10/27/22 11:30  ENA  RNP Ab 0.0 - 0.9 AI  <0.2  SSA (Ro) (ENA) Antibody, IgG <1.0 NEG AI  >8.0 POS !  SSB (La) (ENA) Antibody, IgG <1.0 NEG AI  1.2 POS !  Scleroderma (Scl-70) (ENA) Antibody, IgG <1.0 NEG AI  <1.0 NEG  Prostate Specific Ag, Serum 0.0 - 4.0 ng/mL 0.4   !: Data is abnormal    Latest Reference Range & Units 10/27/22 11:30  Anti Nuclear Antibody (ANA) NEGATIVE  POSITIVE !  ANA Pattern 1  Cytoplasmic !  ANA Titer 1 titer 1:80 (H)  ANCA SCREEN Negative  Negative  Cyclic Citrullin Peptide Ab UNITS <16  ds DNA Ab IU/mL 2  ENA RNP Ab 0.0 - 0.9 AI <0.2  RA Latex Turbid. <14 IU/mL 12  !: Data is abnormal (H): Data is abnormally high    Latest Reference Range & Units 06/10/14 00:00 05/14/15 07:44 04/14/16 07:45 06/13/17 07:54 04/29/18 07:57 04/29/19 08:14 04/29/20 07:22 05/06/21 07:28 11/06/22 05:47  Hemoglobin 13.0 - 17.0 g/dL 16.1 CANCELED 09.6 04.5 14.8 14.4 14.7 14.8 14.4     Latest Reference Range & Units 10/27/22 11:30  A.Fumigatus #1 Abs Negative  Negative  Micropolyspora faeni, IgG Negative  Negative  Thermoactinomyces vulgaris, IgG Negative  Negative  A. Pullulans Abs Negative  Negative  Thermoact. Saccharii Negative  Negative  Pigeon Serum Abs Negative  Negative    PFT     Latest Ref Rng & Units  12/05/2022   10:59 AM  PFT Results  FVC-Pre L 3.41   FVC-Predicted Pre % 70   FVC-Post L 3.44   FVC-Predicted Post % 71   Pre FEV1/FVC % % 81   Post FEV1/FCV % % 84   FEV1-Pre L 2.77   FEV1-Predicted Pre % 75   FEV1-Post L 2.89   DLCO uncorrected ml/min/mmHg 19.84   DLCO UNC% % 70   DLCO corrected ml/min/mmHg 19.95   DLCO COR %Predicted % 71   DLVA Predicted % 95   TLC L 5.24   TLC % Predicted % 74   RV % Predicted % 89     Latest Reference Range & Units 11/06/22 07:47  Monocyte-Macrophage-Serous Fluid 50 - 90 % 37 (L)  Other Cells, Fluid % BRONCHIAL LINING CELLS NOTED ON SMEAR  Color, Fluid YELLOW  PINK !  Total Nucleated Cell Count, Fluid 0 - 1,000 cu mm 106  Fluid Type-FCT  Bronch Lavag  Lymphs, Fluid % 55  Eos, Fluid % 2  Appearance, Fluid CLEAR  HAZY !  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   reports that he has never smoked. He has never used smokeless tobacco.  Past Surgical History:  Procedure Laterality Date   APPENDECTOMY     BRONCHIAL BIOPSY  11/06/2022   Procedure: BRONCHIAL BIOPSIES;  Surgeon: Leslye Peer, MD;  Location: [facility] ENDOSCOPY;  Service: Pulmonary;;   BRONCHIAL WASHINGS  11/06/2022   Procedure: BRONCHIAL WASHINGS;  Surgeon: Leslye Peer, MD;  Location: [facility] ENDOSCOPY;  Service: Pulmonary;;   COLONOSCOPY     HERNIA REPAIR     KNEE SURGERY Left    SHOULDER SURGERY Right    VIDEO BRONCHOSCOPY Bilateral 11/06/2022   Procedure:  VIDEO BRONCHOSCOPY WITH FLUORO;  Surgeon: Leslye Peer, MD;  Location: [facility] ENDOSCOPY;  Service: Pulmonary;  Laterality: Bilateral;    WISDOM TOOTH EXTRACTION      No Known Allergies  Immunization History  Administered Date(s) Administered   Covid-19, Mrna,Vaccine(Spikevax)78yrs and older 05/30/2022   Hepatitis B 01/24/2002   Influenza Inj Mdck Quad Pf 05/30/2022   Influenza,inj,Quad PF,6+ Mos 04/12/2016, 04/28/2020, 05/04/2021   Influenza-Unspecified 04/10/2015   Moderna Covid-19 Vaccine Bivalent Booster 50yrs & up 05/19/2021   Moderna Sars-Covid-2 Vaccination 09/17/2019, 10/17/2019, 05/28/2020   Pneumococcal Polysaccharide-23 06/09/2014   Td 04/23/2018   Tdap 02/03/2008   Typhoid Inactivated 01/24/2002   Zoster Recombinat (Shingrix) 04/28/2020    Family History  Problem Relation Age of Onset   Arthritis Mother        osteo   Diabetes Father    Alzheimer's disease Father    Healthy Sister    Allergies Brother    Cancer Brother        testicular   Healthy Brother      Current Outpatient Medications:    albuterol (VENTOLIN HFA) 108 (90 Base) MCG/ACT inhaler, Inhale 2 puffs into the lungs every 6 (six) hours as needed for wheezing or shortness of breath., Disp: , Rfl:    azelastine (ASTELIN) 0.1 % nasal spray, Place 2 sprays into both nostrils in the morning. Use in each nostril as directed, Disp: , Rfl:    Cholecalciferol (VITAMIN D3 PO), Take 1 tablet by mouth at bedtime., Disp: , Rfl:    fluticasone (FLONASE) 50 MCG/ACT nasal spray, Place 2 sprays into both nostrils in the morning and at bedtime., Disp: , Rfl:    lovastatin (MEVACOR) 20 MG tablet, TAKE 1 TABLET BY MOUTH AT BEDTIME, Disp: 90 tablet, Rfl: 3   Multiple Vitamins-Minerals (MENS MULTIVITAMIN PLUS) TABS, Take 1 tablet by mouth at bedtime., Disp: , Rfl:    pantoprazole (PROTONIX) 40 MG tablet, Take 1 tablet (40 mg total) by mouth daily., Disp: 30 tablet, Rfl: 5   predniSONE (DELTASONE) 10 MG tablet, Take 4 tablets (40 mg total) by mouth daily with breakfast for 10 days, THEN 3 tablets (30 mg total) daily with breakfast for 10 days, THEN 2 tablets  (20 mg total) daily with breakfast for 10 days, THEN 1 tablet (10 mg total) daily with breakfast for 10 days., Disp: 140 tablet, Rfl: 0      Objective:   Vitals:   12/19/22 0939  BP: 120/80  Pulse: 80  SpO2: 95%  Weight: 197 lb 9.6 oz (89.6 kg)  Height: 5\' 10"  (1.778 m)    Estimated body mass index is 28.35 kg/m as calculated from the following:   Height as of this encounter: 5\' 10"  (1.778 m).   Weight as of this encounter: 197 lb 9.6 oz (89.6 kg).  @WEIGHTCHANGE @  Filed Weights   12/19/22 0939  Weight: 197 lb 9.6 oz (89.6 kg)     Physical Exam   General: No distress. Looks fit O2 at rest: no Cane present: no Sitting in wheel chair: no Frail: no Obese: no Neuro: Alert and Oriented x 3. GCS 15. Speech normal Psych: Pleasant Resp:  Barrel Chest - yes.  Wheeze - no, Crackles - YES R > L base but not velcro, No overt respiratory distress CVS: Normal heart sounds. Murmurs - no Ext: Stigmata of Connective Tissue Disease - no HEENT: Normal upper airway. PEERL +. No post nasal drip        Assessment:  ICD-10-CM   1. ILD (interstitial lung disease) (HCC)  J84.9 ANA+ENA+DNA/DS+Scl 70+SjoSSA/B    CBC w/Diff    Sed Rate (ESR)    QuantiFERON-TB Gold Plus    VITAMIN D 25 Hydroxy (Vit-D Deficiency, Fractures)    Glucose 6 phosphate dehydrogenase    Thiopurine methyltransferase(tpmt)rbc    HepB+HepC+HIV Panel    Hemoglobin A1c    DISCONTINUED: predniSONE (DELTASONE) 10 MG tablet    CANCELED: Ambulatory referral to Rheumatology    CANCELED: Hemoglobin A1c    2. Sjogren syndrome with lung involvement (HCC)  M35.02     3. High risk medication use  Z79.899     4. Immunosuppressed status (HCC)  D84.9     5. ANA positive  R76.8     6. Dry mouth  R68.2     7. Lymphocytosis  D72.820     8. Aneurysm of ascending aorta without rupture (HCC)  I71.21     9. Vitamin D deficiency  E55.9      I think we dealing with Sjogren's related ILD particularly lymphocytic  alveolitis.  This is theas  common manifestation and Sjogren's with ILD.  Initially considered potential for lung biopsy for formal rheumatology consultation.  However I was able to touch base with Dr Corliss Skains and I concurred with her opinion this is Sjogren's with lymphocytic alveolitis.  She will try to work him in soon.  In the interim decided start prednisone.  And also CellCept.  We went over CellCept in detail.  These are high risk medications.  I explained to him about all this.  Will do some safety labs also get a pharmacist involved.  I have already discussed with thoracic radiology directly today.  I also discussed with rheumatology.  I also updated his family member Dr. Dorris Fetch.   Plan:     Patient Instructions     ICD-10-CM   1. ILD (interstitial lung disease) (HCC)  J84.9 ANA+ENA+DNA/DS+Scl 70+SjoSSA/B    CBC w/Diff    Sed Rate (ESR)    QuantiFERON-TB Gold Plus    VITAMIN D 25 Hydroxy (Vit-D Deficiency, Fractures)    Glucose 6 phosphate dehydrogenase    Thiopurine methyltransferase(tpmt)rbc    HepB+HepC+HIV Panel    Hemoglobin A1c    DISCONTINUED: predniSONE (DELTASONE) 10 MG tablet    CANCELED: Ambulatory referral to Rheumatology    CANCELED: Hemoglobin A1c    2. Sjogren syndrome with lung involvement (HCC)  M35.02     3. High risk medication use  Z79.899     4. Immunosuppressed status (HCC)  D84.9     5. ANA positive  R76.8     6. Dry mouth  R68.2     7. Lymphocytosis  D72.820     8. Aneurysm of ascending aorta without rupture (HCC)  I71.21     9. Vitamin D deficiency  E55.9      ILD (interstitial lung disease) (HCC) ANA positive Dry mouth Lymphocytosis - bAL April 2024  - You have ILD  - Thre are many types to it -Currently it is unclear what type you have although clinically does not seem to be a common type called IPF -The presence of lymphocytes + t he Sjogren antibody + the dry mouth suggest that your condition could be related to Sjogren  syndrome   Plan (based on shared decision making with you and my direct chat conversation with  Dr Pollyann Savoy rheumatologist)  -Check blood ANA, CK and aldolase today -Check CBC with differential, chemistry, and liver  function test -Check blood ESR -Check blood QuantiFERON gold -Check blood vitamin D and hemoglobin A1c - Check blood G6PD and TPMT for safety labs -Check hepatitis virus panel  -Start prednisone as follows  -40 mg daily x 10 days  and then 30 mg daily x 10 days and then 20 mg daily x  10 days and then 10 mg/day to continue -Refer to outpatient pharmacy team Chesley Mires   -To get CellCept started at 5 mg twice a day  -Rheumatology will try to get you in soon   Vaccine counseling  Plan  - flu shot in fall - RSV vaccine - if you can pay out of pocket anytime next few months -Prevnar vaccine next few weeks/months  Follow-up - Video visit in 1-2 weeks to review and update;   ( Level 05 visit E&M 2024: Estb >= 40 min  in  visit type: on-site physical face to visit  in total care time and counseling or/and coordination of care by this undersigned MD - Dr Kalman Shan. This includes one or more of the following on this same day 12/19/2022: pre-charting, chart review, note writing, documentation discussion of test results, diagnostic or treatment recommendations, prognosis, risks and benefits of management options, instructions, education, compliance or risk-factor reduction. It excludes time spent by the CMA or office staff in the care of the patient. Actual time 80 min)   SIGNATURE    Dr. Kalman Shan, M.D., F.C.C.P,  Pulmonary and Critical Care Medicine Staff Physician, [facility] Center Director - Interstitial Lung Disease  Program  Pulmonary Fibrosis [facility] at [facility] [place], [place], [address]  Pager: [phone], If no answer or between  15:00h - 7:00h: call [phone] Telephone: [phone]  6:01  PM 12/19/2022

## 2022-12-20 LAB — ANA+ENA+DNA/DS+SCL 70+SJOSSA/B: ENA SM Ab Ser-aCnc: <0.2 AI (ref 0.0–0.9)

## 2022-12-20 LAB — HEPB+HEPC+HIV PANEL
Hep B E Ab: NONREACTIVE
Hepatitis B Surface Ag: NEGATIVE

## 2022-12-21 LAB — QUANTIFERON-TB GOLD PLUS: TB2-NIL: <0 IU/mL

## 2022-12-21 NOTE — Progress Notes (Signed)
Office Visit Note  Patient: Joseph Hendricks             Date of Birth: 1965/07/17           MRN: [id number]             PCP: Bosie Clos, MD Referring: Leslye Peer, MD Visit Date: 01/03/2023 Occupation: @GUAROCC @  Subjective:  Positive ANA and ILD  History of Present Illness: Joseph Hendricks is a 58 y.o. male seen in consultation per request of Dr. Marchelle Gearing the evaluation of ILD.  Patient symptoms started in January 2024 with sudden onset of cough.  He was treated with antibiotics x 2 and prednisone for possible pneumonia.  Patient initially thought that he may have COVID-19 infection.  COVID 19 test will negative.  He continued to have cough which progressively got worse.  He was evaluated by Dr. Delton Coombes who gave him antireflux measures and cough medications which helped his cough.  The cough eventually resolved but he continued to have fatigue and shortness of breath.  He had further workup including CT scan of his chest which was consistent with IPF.  He also had bronchoscopy and lavage in April 2024 which showed 55% lymphocytes.  Labs showed positive SSA and SSB and ANA 1: 640 speckled pattern.  Patient was referred to Dr. 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  Patient gives history of dry mouth.  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 twice daily a week ago.  He was started on prednisone 40 mg a day for 10 days then taper by 10 mg every 10 days on December 19, 2022.  The dose of prednisone was increased to 60 mg p.o. daily a week ago.  He noted improvement in shortness of breath after starting prednisone.  He has been walking about 2 miles 4 times a week with minimal difficulty.  He is also active plays golf and does yard work.  There is no family history of autoimmune disease.  History siblings and 3 children who are all in good health.  He states recently his daughter has  been experiencing pain and swelling in her hands and has been seeing a rheumatologist.    Activities of Daily Living:  Patient reports morning stiffness for 0 minutes.   Patient Denies nocturnal pain.  Difficulty dressing/grooming: Denies Difficulty climbing stairs: Denies Difficulty getting out of chair: Denies Difficulty using hands for taps, buttons, cutlery, and/or writing: Denies  Review of Systems  Constitutional:  Positive for fatigue.  HENT:  Positive for mouth dryness. Negative for mouth sores.   Eyes:  Negative for dryness.  Respiratory:  Negative for shortness of breath.   Cardiovascular:  Negative for chest pain and palpitations.  Gastrointestinal:  Negative for blood in stool, constipation and diarrhea.  Endocrine: Positive for increased urination.  Genitourinary:  Negative for painful urination and involuntary urination.  Musculoskeletal:  Negative for joint pain, gait problem, joint pain, joint swelling, myalgias, muscle weakness, morning stiffness, muscle tenderness and myalgias.  Skin:  Negative for color change, rash, hair loss and sensitivity to sunlight.  Allergic/Immunologic: Negative for susceptible to infections.  Neurological:  Negative for dizziness and headaches.  Hematological:  Negative for swollen glands.  Psychiatric/Behavioral:  Positive for sleep disturbance. Negative for depressed mood. The patient is not nervous/anxious.     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/03/2022   in [place] - patient is not aware of this dx as of 11/03/22   Asthma    patient denies this dx as 11/03/22   Chronic cough 10/2022   GERD (gastroesophageal reflux disease)    HLD (hyperlipidemia)    Interstitial lung disease (HCC) 10/2022   Pneumonia    x 1   Seasonal allergies    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   right forearm, left wrist, calf   Tuberculosis    patient denies this dx as of 11/03/22    Family History  Problem Relation Age of Onset   Arthritis Mother        osteo   Factor V Leiden deficiency Mother    Diabetes Father    Alzheimer's disease Father    Healthy Sister    Allergies Brother    Cancer Brother        testicular   Healthy Brother    Healthy Son    Healthy Daughter    Healthy Daughter    Past Surgical History:  Procedure Laterality Date   APPENDECTOMY     BRONCHIAL BIOPSY  11/06/2022   Procedure: BRONCHIAL BIOPSIES;  Surgeon: Leslye Peer, MD;  Location: [facility];  Service: Pulmonary;;   BRONCHIAL WASHINGS  11/06/2022   Procedure: BRONCHIAL WASHINGS;  Surgeon: Leslye Peer, MD;  Location: [facility];  Service: Pulmonary;;   COLONOSCOPY     HERNIA REPAIR     KNEE SURGERY Left    SHOULDER SURGERY Right    SQUAMOUS CELL CARCINOMA EXCISION     x3 or 4 per patient, bilateral arms, back and leg   VIDEO BRONCHOSCOPY Bilateral 11/06/2022   Procedure: VIDEO BRONCHOSCOPY WITH FLUORO;  Surgeon: Leslye Peer, MD;  Location: [facility] ENDOSCOPY;  Service: Pulmonary;  Laterality: Bilateral;   WISDOM TOOTH EXTRACTION     Social History   Social History Narrative   Not on file   Immunization History  Administered Date(s) Administered   Covid-19, Mrna,Vaccine(Spikevax)30yrs and older 05/30/2022   Hepatitis B 01/24/2002   Influenza Inj Mdck Quad Pf 05/30/2022   Influenza,inj,Quad PF,6+ Mos  04/12/2016, 04/28/2020, 05/04/2021   Influenza-Unspecified 04/10/2015   Moderna Covid-19 Vaccine Bivalent Booster 4yrs & up 05/19/2021   Moderna Sars-Covid-2 Vaccination 09/17/2019, 10/17/2019, 05/28/2020   Pneumococcal Polysaccharide-23 06/09/2014   Td 04/23/2018   Tdap 02/03/2008   Typhoid Inactivated 01/24/2002   Zoster Recombinat (Shingrix) 04/28/2020     Objective: Vital Signs: BP 136/87 (BP Location: Right Arm, Patient Position: Sitting, Cuff Size: Normal)   Pulse 73   Resp 17   Ht 5\' 10"  (1.778 m)   Wt 195 lb (88.5 kg)   BMI 27.98 kg/m    Physical Exam Vitals and nursing note reviewed.  Constitutional:      Appearance: He is well-developed.  HENT:     Head: Normocephalic and atraumatic.  Eyes:  Conjunctiva/sclera: Conjunctivae normal.     Pupils: Pupils are equal, round, and reactive to light.  Cardiovascular:     Rate and Rhythm: Normal rate and regular rhythm.     Heart sounds: Normal heart sounds.  Pulmonary:     Effort: Pulmonary effort is normal.     Breath sounds: Normal breath sounds.     Comments: Bibasilar crackles were audible. Abdominal:     General: Bowel sounds are normal.     Palpations: Abdomen is soft.  Musculoskeletal:     Cervical back: Normal range of motion and neck supple.  Skin:    General: Skin is warm and dry.     Capillary Refill: Capillary refill takes less than 2 seconds.     Comments: No sclerodactyly, nailbed capillary changes or telangiectasia were noted.  Neurological:     Mental Status: He is alert and oriented to person, place, and time.  Psychiatric:        Behavior: Behavior normal.      Musculoskeletal Exam: Cervical, thoracic and lumbar spine were in good range of motion.  Shoulder joints, elbow joints, wrist joints, MCPs PIPs and DIPs with good range of motion.  Mild PIP and DIP thickening was noted.  No synovitis was noted.  Hip joints, knee joints, ankles, MTPs with good range of motion with no synovitis.  CDAI  Exam: CDAI Score: -- Patient Global: --; Provider Global: -- Swollen: --; Tender: -- Joint Exam 01/03/2023   No joint exam has been documented for this visit   There is currently no information documented on the homunculus. Go to the Rheumatology activity and complete the homunculus joint exam.  Investigation: No additional findings.  Imaging: No results found.  Recent Labs: Lab Results  Component Value Date   WBC 3.8 (L) 12/19/2022   HGB 14.7 12/19/2022   PLT 209.0 12/19/2022   NA 140 05/06/2021   K 4.8 05/06/2021   CL 101 05/06/2021   CO2 25 05/06/2021   GLUCOSE 110 (H) 05/06/2021   BUN 15 05/06/2021   CREATININE 0.97 05/06/2021   BILITOT 0.9 05/06/2021   ALKPHOS 48 05/06/2021   AST 19 05/06/2021   ALT 21 05/06/2021   PROT 7.4 05/06/2021   ALBUMIN 4.6 05/06/2021   CALCIUM 9.4 05/06/2021   GFRAA 101 04/29/2020   QFTBGOLDPLUS NEGATIVE 12/19/2022   October 27, 2022 myositis panel negative except anti-SSA 52 Kd> 200, ANA 1: 640 NS, SSA antibody 8.0, SSB 1.2, dsDNA negative, RNP negative, SCL 70 negative, RF negative, anti-CCP negative, Jo 1 negative, ANCA negative  December 19, 2022 hepatitis B-, hepatitis C negative, HIV negative, TB Gold negative, ESR 16, vitamin D 30, double-stranded DNA negative, RNP negative, SCL 70 negative, SSA> 8.0, SSB 2.2, ANA 1: 1280NS, ESR 16, vitamin D 30, G6PD 15.6, TPMT 15  IMPRESSION: No worrisome lytic or sclerotic lesions.   1. Pulmonary parenchymal pattern of peripheral and basilar coarsened ground-glass, slight consolidation and traction bronchiectasis, new from 07/31/2012 and likely due to the sequelae of COVID-19 pneumonia. Findings are suggestive of an alternative diagnosis (not UIP) per consensus guidelines: Diagnosis of Idiopathic Pulmonary Fibrosis: An Official ATS/ERS/JRS/ALAT Clinical Practice Guideline. Am Rosezetta Schlatter Crit Care Med Vol 198, Iss 5, [id number], Mar 10 2017. 2. 4.3 cm ascending aortic aneurysm. Recommend annual  imaging followup by CTA or MRA. This recommendation follows 2010 ACCF/AHA/AATS/ACR/ASA/SCA/SCAI/SIR/STS/SVM Guidelines for the Diagnosis and Management of Patients with Thoracic Aortic Disease. Circulation. 2010; 121: M578-I696. Aortic aneurysm NOS (ICD10-I71.9). 3.  Aortic atherosclerosis (ICD10-I70.0).  Electronically Signed   By: Leanna Battles M.D.   On: 10/19/2022 13:41  Speciality Comments: No specialty comments available.  Procedures:  No procedures performed Allergies: Patient has no known allergies.   Assessment / Plan:     Visit Diagnoses: ILD (interstitial lung disease) (HCC) -patient was diagnosed with NSIP pattern ILD by Dr. Colletta Maryland based on the CT scan.  He was also placed on prednisone on June 11 at 40 mg p.o. daily and the dose was increased to 60 mg p.o. daily a week ago.  He was started on CellCept 500 mg twice daily about a week ago.  Patient denies any shortness of breath currently.  He states his mild shortness of breath improved after being on prednisone.  He was found to have positive SSA and positive SSB antibody despite he was referred to me.  He complains of mild dry mouth.  He denies any history of dry nose or dry eyes.  He denies any history of dry skin.  There is no history of parotid swelling.  He denies history of lymphadenopathy.  He has been tolerating prednisone and CellCept without any side effects.  Side effects of long-term prednisone use including obesity, elevated hemoglobin A1c, hypertension, heart disease, immunosuppression, cataracts, osteoporosis were discussed.  Side effects of CellCept were also reviewed with the patient at length.  A handout was provided.  Patient will be tapering prednisone per Dr. Kerry Fort instructions and most likely will be increasing the dose of CellCept depending on the labs.  Information regarding CellCept was placed in the AVS.  Plan: CK  Medication counseling:   Patient was counseled on the purpose, proper use,  and adverse effects of mycophenolate including risk of infection, new or reactivation of viral infections, nausea, and headaches.  Discussed warning of increased risk of development of lymphoma and other malignancies, particularly of the skin.  Discussed risk of neutropenia and discussed importance of regular labs to monitor blood counts, liver function, and kidney function.  Reviewed risk of congenital malformations, and the importance of contraception while on this medication.  Counseled patient on importance of taking PCP prophylaxis while on mycophenolate.  Counseled patient on purpose, proper use, and adverse effects of sulfamethoxazole/trimethoprim three times a week.  Provided patient with educational materials and answered all questions.  Patient consented to mycophenolate.     High risk medication use-he is on CellCept 500 mg p.o. twice daily prescribed by Dr. Colletta Maryland about a week ago.  He is also on Bactrim DS 3 times a week prophylaxis.  Information regarding immunization was placed in the AVS.  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  He also has positive ANA.  He gives history of mild dry mouth but does not have any other symptoms of Sjogren's.  Association of arrhythmias with Sjogren's was discussed.  He was advised to get a baseline EKG.  Association with Sjogren's with increased risk of lymphoma was discussed.  There is no history of lymphadenopathy or parotid swelling.  A handout on Sjogren's was provided.  Plan: C3 and C4, Protein / creatinine ratio, urine, Urinalysis, Routine w reflex microscopic, SPEP  Positive ANA (antinuclear antibody) -I will obtain additional antibodies.  Plan: Anti-Smith antibody, Beta-2 glycoprotein antibodies, Cardiolipin antibodies, IgG, IgM, IgA  Mild intermittent asthma without complication-patient gives history of mild exercise-induced asthma.  He is very active and walks 2 miles 4 times a week.  He also  goes for hiking and plays golf.  He does yard work.  He noticed improvement in his walking since he has been on prednisone.  Seasonal allergic rhinitis, unspecified trigger  Gastroesophageal reflux disease without esophagitis-currently not symptomatic.  He has a prescription for pantoprazole which she takes on as needed basis.  Hypercholesteremia-he is on lovastatin.  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  Followed by dermatology.  Benign fibroma of prostate  Family history of neurological disease - Alzheimer's-father, PGM  Orders: Orders Placed This Encounter  Procedures   Beta-2 glycoprotein antibodies   Protein / creatinine ratio, urine   Urinalysis, Routine w reflex microscopic   Anti-Smith antibody   CK   C3 and C4   Cardiolipin antibodies, IgG, IgM, IgA   Beta-2-glycoprotein i abs, IgG/M/A   CBC with Differential/Platelet   CMP14+EGFR   No orders of the defined types were placed in this encounter.   Face-to-face time spent with patient was 60 minutes. Greater than 50% of time was spent in counseling and coordination of care.  Follow-Up Instructions: Return for ILD, .   Pollyann Savoy, MD  Note - This record has been created using Animal nutritionist.  Chart creation errors have been sought, but may not always  have been located. Such creation errors do not reflect on  the standard of medical care.

## 2022-12-22 LAB — HEPB+HEPC+HIV PANEL
HIV Screen 4th Generation wRfx: NONREACTIVE
Hep B C IgM: NEGATIVE
Hep B Core Total Ab: NEGATIVE
Hep B E Ag: NEGATIVE
Hep B Surface Ab, Qual: NONREACTIVE
Hep C Virus Ab: NONREACTIVE

## 2022-12-22 LAB — ANA+ENA+DNA/DS+SCL 70+SJOSSA/B
ANA Titer 1: POSITIVE — AB
ENA RNP Ab: <0.2 AI (ref 0.0–0.9)
ENA SSA (RO) Ab: >8 AI — ABNORMAL HIGH (ref 0.0–0.9)
ENA SSB (LA) Ab: 2.2 AI — ABNORMAL HIGH (ref 0.0–0.9)
Scleroderma (Scl-70) (ENA) Antibody, IgG: <0.2 AI (ref 0.0–0.9)
dsDNA Ab: 2 IU/mL (ref 0–9)

## 2022-12-22 LAB — FANA STAINING PATTERNS: Speckled Pattern: 1:1280 {titer} — ABNORMAL HIGH

## 2022-12-23 LAB — ACID FAST CULTURE WITH REFLEXED SENSITIVITIES (MYCOBACTERIA): Acid Fast Culture: NEGATIVE

## 2022-12-24 ENCOUNTER — Telehealth: Payer: Self-pay | Admitting: Internal Medicine

## 2022-12-24 DIAGNOSIS — J849 Interstitial pulmonary disease, unspecified: Secondary | ICD-10-CM

## 2022-12-24 DIAGNOSIS — Z5181 Encounter for therapeutic drug level monitoring: Secondary | ICD-10-CM

## 2022-12-24 DIAGNOSIS — Z2989 Encounter for other specified prophylactic measures: Secondary | ICD-10-CM

## 2022-12-24 DIAGNOSIS — Z7189 Other specified counseling: Secondary | ICD-10-CM

## 2022-12-24 DIAGNOSIS — Z79899 Other long term (current) drug therapy: Secondary | ICD-10-CM

## 2022-12-24 LAB — GLUCOSE 6 PHOSPHATE DEHYDROGENASE: G-6PDH: 15.6 U/g Hgb (ref 7.0–20.5)

## 2022-12-24 LAB — QUANTIFERON-TB GOLD PLUS: TB1-NIL: <0 IU/mL

## 2022-12-24 NOTE — Telephone Encounter (Signed)
ANA titers are worse Other labs ok.   Plan  - noticed appt with DR Corliss Skains is now 01/03/23 this is good news - how is he feeling after starting prednisone? - start bactrim 1 DS daily on MOnday , Wed , Friday only  = refer to Pharmacy team (copied) to get sarted on cellcept  - urgent  - increase prednisone dosing and change to as follows (Because of worsening ANA)  - prednisome 60mg  daily x 2 weeks, then 50mg  daiul x 2 weeks, then 40mg  daily x 2 weeks, then 30 mg daily x 2 weeks and then 20mg  daily x 2 weeks and then 10mg  daily to contne  - DR Nelva Bush might cnage it

## 2022-12-25 MED ORDER — SULFAMETHOXAZOLE-TRIMETHOPRIM 800-160 MG PO TABS
ORAL_TABLET | ORAL | 2 refills | Status: DC
Start: 2022-12-25 — End: 2022-12-26

## 2022-12-25 MED ORDER — MYCOPHENOLATE MOFETIL 500 MG PO TABS
ORAL_TABLET | ORAL | 0 refills | Status: DC
Start: 2022-12-25 — End: 2022-12-26

## 2022-12-25 MED ORDER — PREDNISONE 10 MG PO TABS
ORAL_TABLET | ORAL | 0 refills | Status: AC
Start: 2022-12-25 — End: ?

## 2022-12-25 NOTE — Telephone Encounter (Incomplete)
Pharmacy Note  Subjective: Patient presents today to the [facility] for follow up office visit.  Patient seen by the pharmacist for counseling on mycophenolate (CellCept) for {Rheumatology Indications:22208}.  Objective: CBC    Component Value Date/Time   WBC 3.8 (L) 12/19/2022 1141   RBC 4.57 12/19/2022 1141   HGB 14.7 12/19/2022 1141   HGB 14.8 05/06/2021 0728   HCT 43.1 12/19/2022 1141   HCT 43.6 05/06/2021 0728   PLT 209.0 12/19/2022 1141   PLT 244 05/06/2021 0728   MCV 94.3 12/19/2022 1141   MCV 93 05/06/2021 0728   MCV 92 08/30/2013 1216   MCH 33.1 11/06/2022 0547   MCHC 34.2 12/19/2022 1141   RDW 13.3 12/19/2022 1141   RDW 11.7 05/06/2021 0728   RDW 12.7 08/30/2013 1216   LYMPHSABS 0.9 12/19/2022 1141   LYMPHSABS 1.0 05/06/2021 0728   LYMPHSABS 1.3 08/30/2013 1216   MONOABS 0.4 12/19/2022 1141   MONOABS 0.5 08/30/2013 1216   EOSABS 0.1 12/19/2022 1141   EOSABS 0.2 05/06/2021 0728   EOSABS 0.1 08/30/2013 1216   BASOSABS 0.0 12/19/2022 1141   BASOSABS 0.0 05/06/2021 0728   BASOSABS 0.0 08/30/2013 1216    CMP     Component Value Date/Time   NA 140 05/06/2021 0728   NA 138 08/30/2013 1216   K 4.8 05/06/2021 0728   K 4.2 08/30/2013 1216   CL 101 05/06/2021 0728   CL 107 08/30/2013 1216   CO2 25 05/06/2021 0728   CO2 29 08/30/2013 1216   GLUCOSE 110 (H) 05/06/2021 0728   GLUCOSE 95 08/30/2013 1216   BUN 15 05/06/2021 0728   BUN 15 08/30/2013 1216   CREATININE 0.97 05/06/2021 0728   CREATININE 1.03 08/30/2013 1216   CALCIUM 9.4 05/06/2021 0728   CALCIUM 9.0 08/30/2013 1216   PROT 7.4 05/06/2021 0728   PROT 8.8 (H) 08/30/2013 1216   ALBUMIN 4.6 05/06/2021 0728   ALBUMIN 3.9 08/30/2013 1216   AST 19 05/06/2021 0728   AST 38 (H) 08/30/2013 1216   ALT 21 05/06/2021 0728   ALT 69 08/30/2013 1216   ALKPHOS 48 05/06/2021 0728   ALKPHOS 62 08/30/2013 1216   BILITOT 0.9 05/06/2021 0728   BILITOT 0.9 08/30/2013 1216   GFRNONAA 88 04/29/2020  0722   GFRNONAA >60 08/30/2013 1216   GFRAA 101 04/29/2020 0722   GFRAA >60 08/30/2013 1216    Baseline Immunosuppressant Therapy Labs TB GOLD    Latest Ref Rng & Units 12/19/2022   11:41 AM  Quantiferon TB Gold  Quantiferon TB Gold Plus NEGATIVE NEGATIVE    Hepatitis Panel    Latest Ref Rng & Units 12/19/2022   12:00 AM  Hepatitis  Hep B Surface Ag Negative Negative   Hep B IgM Negative Negative    HIV Lab Results  Component Value Date   HIV Non Reactive 12/19/2022   HIV Non Reactive 04/29/2020   Immunoglobulins   SPEP    Latest Ref Rng & Units 05/06/2021    7:28 AM  Serum Protein Electrophoresis  Total Protein 6.0 - 8.5 g/dL 7.4    [id number] Lab Results  Component Value Date   G6PDH 15.6 12/19/2022   TPMT No results found for: "TPMT"   Chest-xray:  ***  Contraception: patient has had vasectomy and his wife uses hormonal contraception  Assessment/Plan:  Patient was counseled on the purpose, proper use, and adverse effects of mycophenolate including risk of infection, new or reactivation of viral infections, nausea, and headaches.  Discussed warning of increased risk of development of lymphoma and other malignancies, particularly of the skin. Patient established with Dr. Adolphus Birchwood at [facility] due to his personal history of squamous cell carcinoma. Has had multiple excisions.  Discussed risk of neutropenia and discussed importance of regular labs to monitor blood counts, liver function, and kidney function.  Reviewed risk of congenital malformations, and the importance of contraception while on this medication.  Counseled patient on importance of taking PCP prophylaxis while on mycophenolate.  Counseled patient on purpose, proper use, and adverse effects of sulfamethoxazole/trimethoprim three times a week. Provided patient with educational materials and answered all questions.  Patient consented to mycophenolate.    Plan: Increase prednisone to 60mg  daily x 2  weeks, then 50mg  daily x 2 weeks, then 40mg  daily x 2 weeks, then 30 mg daily x 2 weeks and then 20mg  daily x 2 weeks and then 10mg  daily to continue Start Bactrim DS Mondays, Wednesdays, Fridays for PJP prophylaxis. Start Cellcept 500mg  twice daily x 2 weeks. Repeat CBC and CMP. If stable, increase to 1000mg  twice daily.  Chesley Mires, PharmD, MPH, BCPS, CPP Clinical Pharmacist (Rheumatology and Pulmonology)

## 2022-12-26 ENCOUNTER — Other Ambulatory Visit: Payer: Self-pay | Admitting: Pharmacist

## 2022-12-26 DIAGNOSIS — Z2989 Encounter for other specified prophylactic measures: Secondary | ICD-10-CM

## 2022-12-26 DIAGNOSIS — J849 Interstitial pulmonary disease, unspecified: Secondary | ICD-10-CM

## 2022-12-26 MED ORDER — MYCOPHENOLATE MOFETIL 500 MG PO TABS
ORAL_TABLET | ORAL | 0 refills | Status: DC
Start: 2022-12-26 — End: 2023-01-17

## 2022-12-26 MED ORDER — SULFAMETHOXAZOLE-TRIMETHOPRIM 800-160 MG PO TABS
ORAL_TABLET | ORAL | 2 refills | Status: DC
Start: 2022-12-26 — End: 2022-12-26

## 2022-12-26 MED ORDER — SULFAMETHOXAZOLE-TRIMETHOPRIM 800-160 MG PO TABS
ORAL_TABLET | ORAL | 2 refills | Status: DC
Start: 2022-12-26 — End: 2023-08-31

## 2022-12-26 NOTE — Addendum Note (Signed)
Addended by: Murrell Redden on: 12/26/2022 08:00 AM   Modules accepted: Orders

## 2022-12-28 LAB — QUANTIFERON-TB GOLD PLUS
Mitogen-NIL: 9.13 IU/mL
NIL: 0.05 IU/mL
QuantiFERON-TB Gold Plus: NEGATIVE

## 2022-12-28 LAB — THIOPURINE METHYLTRANSFERASE (TPMT), RBC: Thiopurine Methyltransferase, RBC: 15 nmol/hr/mL RBC

## 2022-12-28 LAB — VITAMIN D 25 HYDROXY (VIT D DEFICIENCY, FRACTURES): Vit D, 25-Hydroxy: 30 ng/mL (ref 30–100)

## 2023-01-03 ENCOUNTER — Ambulatory Visit: Payer: BC Managed Care – PPO | Attending: Rheumatology | Admitting: Rheumatology

## 2023-01-03 ENCOUNTER — Encounter: Payer: Self-pay | Admitting: Rheumatology

## 2023-01-03 VITALS — BP 136/87 | HR 73 | Resp 17 | Ht 70.0 in | Wt 195.0 lb

## 2023-01-03 DIAGNOSIS — Z79899 Other long term (current) drug therapy: Secondary | ICD-10-CM

## 2023-01-03 DIAGNOSIS — J849 Interstitial pulmonary disease, unspecified: Secondary | ICD-10-CM

## 2023-01-03 DIAGNOSIS — M3509 Sicca syndrome with other organ involvement: Secondary | ICD-10-CM

## 2023-01-03 DIAGNOSIS — K219 Gastro-esophageal reflux disease without esophagitis: Secondary | ICD-10-CM

## 2023-01-03 DIAGNOSIS — N400 Enlarged prostate without lower urinary tract symptoms: Secondary | ICD-10-CM

## 2023-01-03 DIAGNOSIS — J4520 Mild intermittent asthma, uncomplicated: Secondary | ICD-10-CM | POA: Diagnosis not present

## 2023-01-03 DIAGNOSIS — G4730 Sleep apnea, unspecified: Secondary | ICD-10-CM

## 2023-01-03 DIAGNOSIS — R768 Other specified abnormal immunological findings in serum: Secondary | ICD-10-CM

## 2023-01-03 DIAGNOSIS — J302 Other seasonal allergic rhinitis: Secondary | ICD-10-CM

## 2023-01-03 DIAGNOSIS — R4589 Other symptoms and signs involving emotional state: Secondary | ICD-10-CM

## 2023-01-03 DIAGNOSIS — E7800 Pure hypercholesterolemia, unspecified: Secondary | ICD-10-CM

## 2023-01-03 DIAGNOSIS — C4491 Basal cell carcinoma of skin, unspecified: Secondary | ICD-10-CM

## 2023-01-03 DIAGNOSIS — K449 Diaphragmatic hernia without obstruction or gangrene: Secondary | ICD-10-CM

## 2023-01-03 DIAGNOSIS — Z820 Family history of epilepsy and other diseases of the nervous system: Secondary | ICD-10-CM

## 2023-01-03 NOTE — Patient Instructions (Addendum)
Sjogren's Syndrome Sjgren's syndrome is an inflammatory disease in which the body's disease-fighting system (immune system) attacks the glands that produce tears (lacrimal glands) and the glands that produce saliva (salivary glands). This makes the eyes and mouth very dry. Sjgren's syndrome can also affect other parts of the body, causing dryness of the skin, nose, throat, and vagina. Sjgren's syndrome is a long-term (chronic) disorder that has no cure. In some cases, it is linked to other disorders (rheumatic disorders), such as rheumatoid arthritis and systemic lupus erythematosus (SLE). It may affect other parts of the body, such as the: Blood vessels. Joints. Lungs. Kidneys. Liver or pancreas. Brain, nerves, or spinal cord. What are the causes? The cause of this condition is not known. It may be passed along from parent to child (inherited), or it may be a symptom of a rheumatic disorder. What increases the risk? This condition is more likely to develop in: Women. People who are 45-50 years old and older. People who have recently had a viral infection or currently have a viral infection. What are the signs or symptoms? The main symptoms of this condition are: Dry mouth. This may include: A chalky feeling. Difficulty swallowing, speaking, or tasting. Frequent cavities in the teeth. Frequent mouth infections. Dry eyes. This may include: Burning, redness, and itching. Blurry vision. Fluctuating vision. Light sensitivity. Other symptoms may include: Dryness of the skin and the inside of the nose. Eyelid infections. Vaginal dryness (if applicable). Joint pain and stiffness. Muscle pain and stiffness. How is this diagnosed? This condition is diagnosed based on: Your symptoms. Your medical history. A physical exam of your eyes and mouth. Tests, including: A Schirmer test. This tests your tear production. An eye exam that is done with a magnifying device (slit-lamp exam). An  eye test that temporarily stains your eye with special dyes. This shows the extent of eye damage. Tests to check your salivary gland function. Biopsy. This is a removal of part of a salivary gland from inside your lower lip to be studied under a microscope. Chest X-rays. Blood or urine tests. How is this treated? There is no cure for this condition, but treatment can help you manage your symptoms. You may be asked to see a rheumatologist for further evaluation and treatment. This condition may be treated with: Medicines to help relieve pain and stiffness. Medicines to help relieve inflammation in your body (corticosteroids). These are usually for severe cases. Medicines to help reduce the activity of your immune system (immunosuppressants). These are usually prescribed by your health care provider or a rheumatologist. Moisture replacement therapies to help relieve dryness in your skin, mouth, and eyes. Dry eyes may be treated with: Eye drops or nasal sprays to improve dryness of the eyes. Surgery or insertion of plugs to close the lacrimal glands (punctal occlusion). This helps keep more natural tears in your eyes. Soft contact lenses or hard scleral lenses. These are occasionally used to protect the surface of the eye. Biologic lubricating eye drops (serum tears). These are eye drops made from a person's own blood. They are used in some people with severe dry eye. Follow these instructions at home: Eye care  Use eye drops and other medicines as told by your health care provider. Protect your eyes from the sun and wind with sunglasses or glasses. Blink at least 5-6 times a minute. Maintain properly humidified air. You may want to use a humidifier at home and at work. Avoid smoke. Mouth care Brush your teeth and floss   after every meal. Chew sugar-free gum or suck on hard candy. This may help to relieve dry mouth. Use antimicrobial mouthwash daily. Take frequent sips of water or sugar-free  drinks. Use saliva substitutes or lip balm as told by your health care provider. See your dentist every 6 months. General instructions  Take over-the-counter and prescription medicines only as told by your health care provider. Drink enough fluid to keep your urine pale yellow. Keep all follow-up visits. This is important. Contact a health care provider if: You have a fever. You have night sweats. You are always tired. You have unexplained weight loss. You develop itchy skin. You have red patches on your skin. You have a lump or swelling on your neck. Get help right away if: You develop severe eye pain. You develop sudden decreased vision. Summary Sjgren's syndrome is a disease in which the body's immune system attacks the glands that produce tears and the glands that produce saliva. This condition makes the eyes and mouth very dry. Sjgren's syndrome is a long-term (chronic) disorder. There is no cure for this condition, but treatment can help you manage your symptoms. The cause of this condition is not known. You may be asked to see a rheumatologist for further evaluation and treatment. This information is not intended to replace advice given to you by your health care provider. Make sure you discuss any questions you have with your health care provider. Document Revised: 01/24/2021 Document Reviewed: 01/24/2021 Elsevier Patient Education  2024 [employer] Mycophenolate Capsules What is this medication? MYCOPHENOLATE (mye koe FEN oh late) prevents the body from rejecting an organ transplant. It works by lowering the body's immune system response. This helps the body accept the donor organ. It belongs to a group of medications called immunosuppressants. This medicine may be used for other purposes; ask your health care provider or pharmacist if you have questions. COMMON BRAND NAME(S): CellCept What should I tell my care team before I take this medication? They need to know if  you have any of these conditions: Anemia Blood disorder Cancer Diarrhea Immune system problems Infection, such as chickenpox, cold sores, herpes Kidney disease Recent or upcoming vaccine Stomach problems An unusual or allergic reaction to mycophenolate, other medications, foods, dyes, or preservatives Pregnant or trying to get pregnant Breastfeeding How should I use this medication? Take this medication by mouth with a full glass of water. Follow the directions on the prescription label. Take this medication on an empty stomach, at least 1 hour before or 2 hours after food. Do not take with food unless your care team approves. Swallow the medication whole. Do not cut, crush, or chew the medication. If the medication is broken or is not intact, do not get the powder on your skin or eyes. If contact occurs, rinse thoroughly with water. Take your medication at regular intervals. Do not take your medication more often than directed. Do not stop taking except on your care team's advice. A special MedGuide will be given to you by the pharmacist with each prescription and refill. Be sure to read this information carefully each time. Talk to your care team about the use of this medication in children. While this medication may be prescribed for selected conditions, precautions do apply. Overdosage: If you think you have taken too much of this medicine contact a poison control center or emergency room at once. NOTE: This medicine is only for you. Do not share this medicine with others. What if I miss a dose?  If you miss a dose, take it as soon as you can. If it is almost time for your next dose, take only that dose. Do not take double or extra doses. What may interact with this medication? Do not take this medication with any of the following: Live virus vaccines This medication may also interact with the following: Acyclovir or valacyclovir Azathioprine Certain antibiotics, such as ciprofloxacin,  levofloxacin, norfloxacin, trimethoprim; sulfamethoxazole, penicillin, amoxicillin; clavulanic acid Certain medications for stomach problems, such as lansoprazole, omeprazole, pantoprazole Cyclosporine Estrogen and progestin hormones Ganciclovir or valganciclovir Isavuconazonium Medications for cholesterol, such as cholestyramine or colestipol Metronidazole Other medications that contain mycophenolate Probenecid Rifampin Sevelamer Stomach acid blockers, such as magnesium hydroxide or aluminum hydroxide Telmisartan This list may not describe all possible interactions. Give your health care provider a list of all the medicines, herbs, non-prescription drugs, or dietary supplements you use. Also tell them if you smoke, drink alcohol, or use illegal drugs. Some items may interact with your medicine. What should I watch for while using this medication? Visit your care team for regular checks on your progress. You will need frequent blood checks during the first few months you are receiving the medication. This medication may affect your coordination, reaction time, or judgment. Do not drive or operate machinery until you know how this medication affects you. Sit up or stand slowly to reduce the risk of dizzy or fainting spells. Drinking alcohol with this medication can increase the risk of these side effects. This medication may increase your risk of getting an infection. Call your care team for advice if you get a fever, chills, sore throat, or other symptoms of a cold or flu. Do not treat yourself. Try to avoid being around people who are sick. This medication can make you more sensitive to the sun. Keep out of the sun. If you cannot avoid being in the sun, wear protective clothing and sunscreen. Do not use sun lamps, tanning beds, or tanning booths. Talk to your care team about your risk of cancer. You may be more at risk for certain types of cancer if you take this medication. Talk to your care  team if you or your partner may be pregnant. Serious birth defects can occur if you take this medication during pregnancy and for 6 weeks after the last dose. You will need a negative pregnancy test before starting this medication. Estrogen and progestin hormones may not work as well while you are taking this medication. Contraception is recommended while taking this medication and for 6 weeks after the last dose. Your care team can help you find the option that works for you. If your partner can get pregnant, use a condom during sex while taking this medication and for 90 days after the last dose. Talk to your care team before breastfeeding. Changes to your treatment plan may be needed. Do not donate sperm while taking this medication and for 90 days after the last dose. Do not donate blood while you are taking this medication and for 6 weeks after the last dose. Donated blood may contain enough of this medication to cause birth defects in a fetus if transfused to someone who is pregnant. What side effects may I notice from receiving this medication? Side effects that you should report to your care team as soon as possible: Allergic reactions--skin rash, itching, hives, swelling of the face, lips, tongue, or throat Infection--fever, chills, cough, sore throat, wounds that don't heal, pain or trouble when passing urine, general  feeling of discomfort or being unwell Joint, muscle, or tendon pain, swelling, or stiffness Low red blood cell level--unusual weakness or fatigue, dizziness, headache, trouble breathing Peptic ulcer--burning stomach pain, loss of appetite, bloating, burping, heartburn, nausea, vomiting Stomach bleeding--bloody or black, tar-like stools, vomiting blood or brown material that looks like coffee grounds Stomach pain that is severe, does not go away, or gets worse Unusual bruising or bleeding Side effects that usually do not require medical attention (report to your care team if  they continue or are bothersome): Diarrhea Dizziness Headache Nausea Swelling of the ankles, hands, or feet Tremors or shaking Trouble sleeping This list may not describe all possible side effects. Call your doctor for medical advice about side effects. You may report side effects to FDA at 1-800-FDA-1088. Where should I keep my medication? Keep out of the reach of children and pets. Store at room temperature between 20 and 25 degrees C (68 and 77 degrees F). Get rid of any unused medication after the expiration date. To get rid of medications that are no longer needed or have expired: Take the medication to a medication take-back program. Check with your pharmacy or law enforcement to find a location. If you cannot return the medication, ask your pharmacist or care team how to get rid of this medication safely. NOTE: This sheet is a summary. It may not cover all possible information. If you have questions about this medicine, talk to your doctor, pharmacist, or health care provider.  2024 Elsevier/Gold Standard (2022-05-04 00:00:00)  Vaccines You are taking a medication(s) that can suppress your immune system.  The following immunizations are recommended: Flu annually Covid-19  RSV Td/Tdap (tetanus, diphtheria, pertussis) every 10 years Pneumonia (Prevnar 15 then Pneumovax 23 at least 1 year apart.  Alternatively, can take Prevnar 20 without needing additional dose) Shingrix: 2 doses from 4 weeks to 6 months apart  Please check with your PCP to make sure you are up to date.

## 2023-01-03 NOTE — Progress Notes (Signed)
Pharmacy Note  Subjective: Patient presents today to the [facility] for follow up office visit.  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  He was counseled last week over the phone regarding Cellcept but is concerned about infections and how taking Cellcept would affect his daily life  Objective: CBC    Component Value Date/Time   WBC 3.8 (L) 12/19/2022 1141   RBC 4.57 12/19/2022 1141   HGB 14.7 12/19/2022 1141   HGB 14.8 05/06/2021 0728   HCT 43.1 12/19/2022 1141   HCT 43.6 05/06/2021 0728   PLT 209.0 12/19/2022 1141   PLT 244 05/06/2021 0728   MCV 94.3 12/19/2022 1141   MCV 93 05/06/2021 0728   MCV 92 08/30/2013 1216   MCH 33.1 11/06/2022 0547   MCHC 34.2 12/19/2022 1141   RDW 13.3 12/19/2022 1141   RDW 11.7 05/06/2021 0728   RDW 12.7 08/30/2013 1216   LYMPHSABS 0.9 12/19/2022 1141   LYMPHSABS 1.0 05/06/2021 0728   LYMPHSABS 1.3 08/30/2013 1216   MONOABS 0.4 12/19/2022 1141   MONOABS 0.5 08/30/2013 1216   EOSABS 0.1 12/19/2022 1141   EOSABS 0.2 05/06/2021 0728   EOSABS 0.1 08/30/2013 1216   BASOSABS 0.0 12/19/2022 1141   BASOSABS 0.0 05/06/2021 0728   BASOSABS 0.0 08/30/2013 1216    CMP     Component Value Date/Time   NA 140 05/06/2021 0728   NA 138 08/30/2013 1216   K 4.8 05/06/2021 0728   K 4.2 08/30/2013 1216   CL 101 05/06/2021 0728   CL 107 08/30/2013 1216   CO2 25 05/06/2021 0728   CO2 29 08/30/2013 1216   GLUCOSE 110 (H) 05/06/2021 0728   GLUCOSE 95 08/30/2013 1216   BUN 15 05/06/2021 0728   BUN 15 08/30/2013 1216   CREATININE 0.97 05/06/2021 0728   CREATININE 1.03 08/30/2013 1216   CALCIUM 9.4 05/06/2021 0728   CALCIUM 9.0 08/30/2013 1216   PROT 7.4 05/06/2021 0728   PROT 8.8 (H) 08/30/2013 1216   ALBUMIN 4.6 05/06/2021 0728   ALBUMIN 3.9 08/30/2013 1216   AST 19 05/06/2021 0728   AST 38 (H) 08/30/2013 1216   ALT 21 05/06/2021 0728   ALT 69 08/30/2013  1216   ALKPHOS 48 05/06/2021 0728   ALKPHOS 62 08/30/2013 1216   BILITOT 0.9 05/06/2021 0728   BILITOT 0.9 08/30/2013 1216   GFRNONAA 88 04/29/2020 0722   GFRNONAA >60 08/30/2013 1216   GFRAA 101 04/29/2020 0722   GFRAA >60 08/30/2013 1216    Baseline Immunosuppressant Therapy Labs TB GOLD    Latest Ref Rng & Units 12/19/2022   11:41 AM  Quantiferon TB Gold  Quantiferon TB Gold Plus NEGATIVE NEGATIVE    Hepatitis Panel    Latest Ref Rng & Units 12/19/2022   12:00 AM  Hepatitis  Hep B Surface Ag Negative Negative   Hep B IgM Negative Negative    HIV Lab Results  Component Value Date   HIV Non Reactive 12/19/2022   HIV Non Reactive 04/29/2020   Immunoglobulins   SPEP    Latest Ref Rng & Units 05/06/2021    7:28 AM  Serum Protein Electrophoresis  Total Protein 6.0 - 8.5 g/dL 7.4    U7OZ Lab Results  Component Value Date   G6PDH 15.6 12/19/2022   TPMT Lab Results  Component Value Date   TPMT 15 12/19/2022    Chest-xray:  11/06/22 - No change compared to  the CT scan. No evidence of post biopsy complication.   Contraception: patient has had vasectomy and his wife uses hormonal contraception  Assessment/Plan:  Patient was counseled on the purpose, proper use, and adverse effects of mycophenolate including risk of infection, new or reactivation of viral infections, nausea, and headaches.  Discussed warning of increased risk of development of lymphoma and other malignancies, particularly of the skin.  Discussed risk of neutropenia and discussed importance of regular labs to monitor blood counts, liver function, and kidney function.  Reviewed risk of congenital malformations, and the importance of contraception while on this medication.  Counseled patient on importance of taking PCP prophylaxis while on mycophenolate.  Counseled patient on purpose, proper use, and adverse effects of sulfamethoxazole/trimethoprim three times a week. Provided patient with educational  materials and answered all questions.  Patient consented to mycophenolate  He has started Cellcept + Bactrim + increased prednisone last week.  Increase prednisone to 60mg  PO daily x 1 week, then 50mg  daily x 2 weeks, then 40mg  daily x 2 weeks, then 30 mg daily x 2 weeks and then 20mg  daily x 2 weeks and then 10mg  daily to continue Start Bactrim DS 1 tablet PO Mondays, Wednesdays, Fridays for PJP prophylaxis. Start Cellcept 500mg  PO twice daily x 2 weeks. Repeat CBC and CMP. If stable, increase to 1000mg  twice daily. Reviewed receiving vaccines when he is on prednisone 20mg  or less to allow for vaccine efficacy  Patient is having labwork completed next week.   Chesley Mires, PharmD, MPH, BCPS, CPP Clinical Pharmacist (Rheumatology and Pulmonology)

## 2023-01-04 ENCOUNTER — Telehealth (INDEPENDENT_AMBULATORY_CARE_PROVIDER_SITE_OTHER): Payer: BC Managed Care – PPO | Admitting: Internal Medicine

## 2023-01-04 ENCOUNTER — Encounter: Payer: Self-pay | Admitting: Internal Medicine

## 2023-01-04 VITALS — Ht 70.0 in | Wt 195.0 lb

## 2023-01-04 DIAGNOSIS — J849 Interstitial pulmonary disease, unspecified: Secondary | ICD-10-CM | POA: Diagnosis not present

## 2023-01-04 DIAGNOSIS — Z79899 Other long term (current) drug therapy: Secondary | ICD-10-CM

## 2023-01-04 DIAGNOSIS — M3502 Sicca syndrome with lung involvement: Secondary | ICD-10-CM

## 2023-01-04 LAB — URINALYSIS, ROUTINE W REFLEX MICROSCOPIC
Bilirubin Urine: NEGATIVE
Glucose, UA: NEGATIVE
Hgb urine dipstick: NEGATIVE
Ketones, ur: NEGATIVE
Leukocytes,Ua: NEGATIVE
Nitrite: NEGATIVE
Protein, ur: NEGATIVE
Specific Gravity, Urine: 1.015 (ref 1.001–1.035)
pH: 6.5 (ref 5.0–8.0)

## 2023-01-04 LAB — PROTEIN / CREATININE RATIO, URINE
Creatinine, Urine: 65 mg/dL (ref 20–320)
Protein/Creat Ratio: 92 mg/g creat (ref 25–148)
Protein/Creatinine Ratio: 0.092 mg/mg creat (ref 0.025–0.148)
Total Protein, Urine: 6 mg/dL (ref 5–25)

## 2023-01-04 NOTE — Patient Instructions (Addendum)
ICD-10-CM   1. ILD (interstitial lung disease) (HCC)  J84.9     2. Sjogren syndrome with lung involvement (HCC)  M35.02     3. High risk medication use  Z79.899        ILD (interstitial lung disease) (HCC) ANA positive Dry mouth Lymphocytosis - bAL April 2024  - You have ILD  - Thre are many types to it -Currently it is unclear what type you have although clinically does not seem to be a common type called IPF -The presence of lymphocytes + t he Sjogren antibody + the dry mouth suggest that your condition could be related to Sjogren syndrome   Plan   -As outlined by pharmacy team on 12/24/2022 = Treatment program starting 12/24/2022-      Increase prednisone to 60mg  PO daily x 2 weeks( originally started on 40 mg/day on 12/19/2022], then 50mg  daily x 2 weeks, then 40mg  daily x 2 weeks, then 30 mg daily x 2 weeks and then 20mg  daily x 2 weeks and then 10mg  daily to continue Start Bactrim DS 1 tablet PO Mondays, Wednesdays, Fridays for PJP prophylaxis. Start Cellcept 500mg  PO twice daily x 2 weeks. Repeat CBC and CMP. If stable, increase to 1000mg  twice daily.    Vaccine counseling  Plan  - flu shot in fall - RSV vaccine - if you can pay out of pocket anytime next few months -Prevnar vaccine next few weeks/months  Follow-up - In July through mid August 2024 with pulmonary function testing spirometry and DLCO; 30-minute visit

## 2023-01-04 NOTE — Progress Notes (Signed)
OV 12/19/2022 -transfer of care to see Dr. Marchelle Gearing in the interstitial lung disease center.  History is gained from talking to the patient review of the records and also the ILD questionnaire.  Subjective:  Patient ID: Joseph Hendricks, male , DOB: [birth year] , age 57 y.o. , MRN: [id number] , ADDRESS: [address] [place] [address] PCP Bosie Clos, MD Patient Care Team: Bosie Clos, MD as PCP - General (Family Medicine)  This Provider for this visit: Treatment Team:  Attending Provider: Kalman Shan, MD    12/19/2022 -   Chief Complaint  Patient presents with   Consult    Consult for possible ILD, CT scan, and bronch results.     HPI Joseph Hendricks 57 y.o. 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  He is related to Dr. Andrey Spearman the surgeon [his brother-in-law].  He tells me that he has been previously well but in January 2024 all of a sudden he started with a cough that kept insidiously.  He is had 2 rounds of antibiotics and prednisone initially diagnosed also with pneumonia.  He felt he was really bad at the time and could not work.  The cough would get worse with sneezing and yawning.  He thought he might of had COVID at this time in January but no COVID test has been positive.  He subsequently saw Dr. Levy Pupa who controlled his cough with the help of acid reflux treatment.  He also subjective patient other treatment measures.  The cough gradually got away.  He has been stable to walk right now but he still gets fatigued and short of breath.  He says when he goes for his 2 mile walk 4 times a week he has to pause at the top.  This is not his baseline although it is better compared to earlier in the year when he was coughing significantly.  He says through this workup with Dr. Delton Coombes interstitial lung disease was diagnosed.  Then subsequently in April 2024 underwent bronchoscopy with lavage that shows 55%  lymphocytes.  Which means BAL with lymphocytosis.  His autoimmune antibody panel is positive for SSA SSB and related myositis antibody.  His ANA is also trace positive.  At 1: 80 showing cytoplasmic pattern.  Repeat ANA on the same day shows 1: 640 with a nuclear speckled pattern.  His hypersensitive pneumonitis antibody panel is normal.  The CT scan he underwent for evaluation of the cough is a super D CT scan.  I personally visualized it.  I agree it is not consistent with UIP.  I personally thought it look like NSIP.  However Dr. Dorothey Baseman feels that is consistent with organizing pneumonia but we all agree that alternative pattern.  He is awaiting rheumatology consultation but is not until October 2024.  Secure chatted DR Corliss Skains who upon review of the chart feels patient's features are all consistent with Sjogren's syndrome.  [He does have a history of dry mouth].  Detail ILD question as below    [place] [facility] Comprehensive ILD Questionnaire  Symptoms:    Past Medical History :  -He has history of exercise-induced asthma for a few decades - Prior connective tissue diagnosis diagnosis is negative.  Although he is aware of his recent antibody panel positivity. - He has had COVID-vaccine but not formally the COVID disease although at the onset of the illness he thought he may have had COVID  ROS:  -Positive for fatigue for the last several months - Positive arthralgia for the last few years particularly in the fingers - She does have an occasional heartburn [previously had significant heartburn and he lost 25 pounds and his heartburn resolved].  He does occasionally snore -He does have dry mouth for years.  Is particularly worse in the morning.  But no dry hands  FAMILY HISTORY of LUNG DISEASE:  -Asthma in her brother but otherwise negative for family history of lung disease  PERSONAL EXPOSURE HISTORY:  -Smoked marijuana few x 35 to 40 years ago but otherwise no current marijuana  use no tobacco use no no cocaine use no intravenous drug use  HOME  EXPOSURE and HOBBY DETAILS :  -[place] home [place].  Home was built in 1989.  He has lived there past 21 years.  Detail organic antigen exposure history in the house is negative.  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  All corporate finance related.  Detail organic and inorganic antigen exposure history at work or else was negative.  PULMONARY TOXICITY HISTORY (27 items):  -Other than short course of prednisone he has not had any drugs that can cause pulmonary toxicity.  INVESTIGATIONS: As below    SUPER D CT 10/18/22 - personally visualzied, interpreted but agree with findings below -per email communication with Dr. Dorothey Baseman for second opinionm " This looks like organizing PNA to me.  Sjgren syndrome can present with an OP pattern.  Could also be due to some other insult like a drug reaction.    arrative & Impression  CLINICAL DATA:  Lung nodule. History of pneumonia with abnormal pulmonary function tests.   EXAM: CT CHEST WITHOUT CONTRAST   TECHNIQUE: Multidetector CT imaging of the chest was performed using thin slice collimation for electromagnetic bronchoscopy planning purposes, without intravenous contrast.   RADIATION DOSE REDUCTION: This exam was performed according to the departmental dose-optimization program which includes automated exposure control, adjustment of the mA and/or kV according to patient size and/or use of iterative reconstruction technique.   COMPARISON:  None Available.   FINDINGS: Cardiovascular: 07/31/2012.   Mediastinum/Nodes: Atherosclerotic calcification of the aorta. Ascending aorta measures 4.3 cm (5/51). Comparison with 07/31/2012 is challenging in the absence of coronal reformatted images. Heart is enlarged. No pericardial effusion.   Lungs/Pleura: No pathologically enlarged mediastinal or  axillary lymph nodes. Hilar regions are difficult to definitively evaluate without IV contrast. Esophagus is grossly unremarkable.   Upper Abdomen: Patchy peripheral and basilar coarsened ground-glass and slight consolidation with traction bronchiectasis, new from 07/31/2012. No pleural fluid. Airway is unremarkable.   Musculoskeletal: Visualized portions of the liver, gallbladder adrenal glands are unremarkable. Low-attenuation lesion in the right kidney. No specific follow-up necessary. Visualized portions of the kidneys, spleen, pancreas, stomach and bowel are otherwise grossly unremarkable. No upper abdominal adenopathy.   IMPRESSION: No worrisome lytic or sclerotic lesions.   1. Pulmonary parenchymal pattern of peripheral and basilar coarsened ground-glass, slight consolidation and traction bronchiectasis, new from 07/31/2012 and likely due to the sequelae of COVID-19 pneumonia. Findings are suggestive of an alternative diagnosis (not UIP) per consensus guidelines: Diagnosis of Idiopathic Pulmonary Fibrosis: An Official ATS/ERS/JRS/ALAT Clinical Practice Guideline. Am Rosezetta Schlatter Crit Care Med Vol 198, Iss 5, [id number], Mar 10 2017. 2. 4.3 cm ascending aortic aneurysm. Recommend annual imaging followup by CTA or MRA. This recommendation follows 2010 ACCF/AHA/AATS/ACR/ASA/SCA/SCAI/SIR/STS/SVM Guidelines for the Diagnosis and Management of Patients with Thoracic  Aortic Disease. Circulation. 2010; 121: Z610-R604. Aortic aneurysm NOS (ICD10-I71.9). 3.  Aortic atherosclerosis (ICD10-I70.0).     Electronically Signed   By: Leanna Battles M.D.   On: 10/19/2022 13:41     Latest Reference Range & Units 10/27/22 11:31  Anti-Jo-1 Ab (RDL) <20 Units <20  Anti-PL-7 Ab (RDL) Negative  Negative  Anti-PL-12 Ab (RDL) Negative  Negative  Anti-EJ Ab (RDL) Negative  Negative  Anti-OJ Ab (RDL) Negative  Negative  Anti-SRP Ab (RDL) Negative  Negative  Anti-Mi-2 Ab (RDL) Negative  Negative   VWUJ-[id number]-[id number] Ab (RDL) <20 Units <20  Anti-MDA-5 Ab (CADM-140)(RDL) <20 Units <20  Anti-NXP-2 (P140) Ab (RDL) <20 Units <20  Anti-SAE1 Ab, IgG (RDL) <20 Units <20  Anti-PM/Scl-100 Ab (RDL) <20 Units <20  Anti-Ku Ab (RDL) Negative  Weak Positive !  Anti-SS-A 52kD Ab, IgG (RDL) <20 Units >200 (H)  Anti-U1 RNP Ab (RDL) <20 Units <20  Anti-U2 RNP Ab (RDL) Negative  Negative  Anti-U3 RNP (Fibrillarin)(RDL) Negative  Negative  !: Data is abnormal (H): Data is abnormally high    Latest Reference Range & Units 05/06/21 07:28 10/27/22 11:30  ENA RNP Ab 0.0 - 0.9 AI  <0.2  SSA (Ro) (ENA) Antibody, IgG <1.0 NEG AI  >8.0 POS !  SSB (La) (ENA) Antibody, IgG <1.0 NEG AI  1.2 POS !  Scleroderma (Scl-70) (ENA) Antibody, IgG <1.0 NEG AI  <1.0 NEG  Prostate Specific Ag, Serum 0.0 - 4.0 ng/mL 0.4   !: Data is abnormal    Latest Reference Range & Units 10/27/22 11:30  Anti Nuclear Antibody (ANA) NEGATIVE  POSITIVE !  ANA Pattern 1  Cytoplasmic !  ANA Titer 1 titer 1:80 (H)  ANCA SCREEN Negative  Negative  Cyclic Citrullin Peptide Ab UNITS <16  ds DNA Ab IU/mL 2  ENA RNP Ab 0.0 - 0.9 AI <0.2  RA Latex Turbid. <14 IU/mL 12  !: Data is abnormal (H): Data is abnormally high    Latest Reference Range & Units 06/10/14 00:00 05/14/15 07:44 04/14/16 07:45 06/13/17 07:54 04/29/18 07:57 04/29/19 08:14 04/29/20 07:22 05/06/21 07:28 11/06/22 05:47  Hemoglobin 13.0 - 17.0 g/dL 95.6 CANCELED 21.3 08.6 14.8 14.4 14.7 14.8 14.4     Latest Reference Range & Units 10/27/22 11:30  A.Fumigatus #1 Abs Negative  Negative  Micropolyspora faeni, IgG Negative  Negative  Thermoactinomyces vulgaris, IgG Negative  Negative  A. Pullulans Abs Negative  Negative  Thermoact. Saccharii Negative  Negative  Pigeon Serum Abs Negative  Negative     Latest Reference Range & Units 12/19/22 00:00  ENA SSA (RO) Ab 0.0 - 0.9 AI >8.0 (H)  ENA SSB (LA) Ab 0.0 - 0.9 AI 2.2 (H)  (H): Data is abnormally high   12/19/22 00:00   Speckled Pattern 1:1280 (H)  (H): Data is abnormally high  OV 01/04/2023  Subjective:  Patient ID: Joseph Hendricks, male , DOB: 06-28-1966 , age 55 y.o. , MRN: [id number] , ADDRESS: [address] [place] [address] PCP Bosie Clos, MD Patient Care Team: Bosie Clos, MD as PCP - General (Family Medicine)  This Provider for this visit: Treatment Team:  Attending Provider: Kalman Shan, MD  Type of visit: Video Virtual Visit Identification of patient Joseph Hendricks with [birth year] and MRN [id number] - 2 person identifier Risks: Risks, benefits, limitations of telephone visit explained. Patient understood and verbalized agreement to proceed Anyone else on call: Just him Patient location: Appears to be in his office This  provider location: [address], [address]; [place]; [place] [address]. [facility]. [phone]    01/04/2023 -   Chief Complaint  Patient presents with   Follow-up    F/up on ILD, confused about which Dr will be taking over ILD care.   Sjogren's with interstitial lung disease  Treatment program starting 12/24/2022-      Increase prednisone to 60mg  PO daily x 2 weeks( originally started on 40 mg/day on 12/19/2022], then 50mg  daily x 2 weeks, then 40mg  daily x 2 weeks, then 30 mg daily x 2 weeks and then 20mg  daily x 2 weeks and then 10mg  daily to continue Start Bactrim DS 1 tablet PO Mondays, Wednesdays, Fridays for PJP prophylaxis. Start Cellcept 500mg  PO twice daily x 2 weeks. Repeat CBC and CMP. If stable, increase to 1000mg  twice daily.  HPI Joseph Hendricks 57 y.o. -in this video visit Joseph Hendricks tells me that he is doing much better after starting prednisone he feels better.  The prednisone is increasing his hunger and giving some insomnia but he is taking melatonin now.  He is taking his Bactrim.  He is also taking his CellCept.  He met with pharmacy.  Reviewed his record he also met with rheumatologist Dr.  Corliss Skains yesterday.  He is pleased with both visits.  He did have some labs.  He is has got upcoming safety labs.  He wanted to know who would be his primary physician.  I did explain to him with the collaborative support between rheumatology and myself and pulmonary.  I did indicate to him that I will be following the pulmonary side.  Did indicate to him that patient have clinical pharmacist and many times if the primary problem is interstitial lung disease Dr Shan Levans will make recommendations and allow me to adjust his medications.  However I did indicate to him he needs continued monitoring from rheumatology over time because of extrapulmonary manifestations associated with Sjogren's.  He verbalized understanding.   There are no other new issues.  This visit is video check-in  SYMPTOM SCALE - ILD 12/19/2022  Current weight   O2 use ra  Shortness of Breath 0 -> 5 scale with 5 being worst (score 6 If unable to do)  At rest 0  Simple tasks - showers, clothes change, eating, shaving 0  Household (dishes, doing bed, laundry) 1.5  Shopping 0  Walking level at own pace 0  Walking up Stairs 2.5  Total (30-36) Dyspnea Score 4      Non-dyspnea symptoms (0-> 5 scale) 12/19/2022  How bad is your cough? 0  How bad is your fatigue 1.5  How bad is nausea 0  How bad is vomiting?  0  How bad is diarrhea? 0  How bad is anxiety? 1.5  How bad is depression 0  Any chronic pain - if so where and how bad no     Simple office walk 224 (66+46 x 2) feet Pod A at [facility] x  3 laps goal with forehead probe 12/19/2022    O2 used ra   Number laps completed Sit stand x 15   Comments about pace good   Resting Pulse Ox/HR 96% and 73/min   Final Pulse Ox/HR 97% and 104/min   Desaturated </= 88% no   Desaturated <= 3% points no   Got Tachycardic >/= 90/min yes   Symptoms at end of test Mildd dypsne   Miscellaneous comments x  PFT     Latest Ref Rng & Units 12/05/2022   10:59 AM  PFT  Results  FVC-Pre L 3.41   FVC-Predicted Pre % 70   FVC-Post L 3.44   FVC-Predicted Post % 71   Pre FEV1/FVC % % 81   Post FEV1/FCV % % 84   FEV1-Pre L 2.77   FEV1-Predicted Pre % 75   FEV1-Post L 2.89   DLCO uncorrected ml/min/mmHg 19.84   DLCO UNC% % 70   DLCO corrected ml/min/mmHg 19.95   DLCO COR %Predicted % 71   DLVA Predicted % 95   TLC L 5.24   TLC % Predicted % 74   RV % Predicted % 89     has a past medical history of 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   reports that he has never smoked. He has never been exposed to tobacco smoke. He has never used smokeless tobacco.  Past Surgical History:  Procedure Laterality Date   APPENDECTOMY     BRONCHIAL BIOPSY  11/06/2022   Procedure: BRONCHIAL BIOPSIES;  Surgeon: Leslye Peer, MD;  Location: [facility] ENDOSCOPY;  Service: Pulmonary;;   BRONCHIAL WASHINGS  11/06/2022   Procedure: BRONCHIAL WASHINGS;  Surgeon: Leslye Peer, MD;  Location: [facility] ENDOSCOPY;  Service: Pulmonary;;   COLONOSCOPY     HERNIA REPAIR     KNEE SURGERY Left    SHOULDER SURGERY Right    SQUAMOUS CELL CARCINOMA EXCISION     x3 or 4 per patient, bilateral arms, back and leg   VIDEO BRONCHOSCOPY Bilateral 11/06/2022   Procedure: VIDEO BRONCHOSCOPY WITH FLUORO;  Surgeon: Leslye Peer, MD;  Location: [facility] ENDOSCOPY;  Service: Pulmonary;  Laterality: Bilateral;   WISDOM TOOTH EXTRACTION      No Known Allergies  Immunization History  Administered Date(s) Administered   Covid-19, Mrna,Vaccine(Spikevax)37yrs and older 05/30/2022   Hepatitis B 01/24/2002   Influenza Inj Mdck Quad Pf 05/30/2022   Influenza,inj,Quad PF,6+ Mos 04/12/2016, 04/28/2020, 05/04/2021   Influenza-Unspecified  04/10/2015   Moderna Covid-19 Vaccine Bivalent Booster 44yrs & up 05/19/2021   Moderna Sars-Covid-2 Vaccination 09/17/2019, 10/17/2019, 05/28/2020   Pneumococcal Polysaccharide-23 06/09/2014   Td 04/23/2018   Tdap 02/03/2008   Typhoid Inactivated 01/24/2002   Zoster Recombinat (Shingrix) 04/28/2020    Family History  Problem Relation Age of Onset   Arthritis Mother        osteo   Factor V Leiden deficiency Mother    Diabetes Father    Alzheimer's disease Father    Healthy Sister    Allergies Brother    Cancer Brother        testicular   Healthy Brother    Healthy Son    Healthy Daughter    Healthy Daughter      Current Outpatient Medications:    albuterol (VENTOLIN HFA) 108 (90 Base) MCG/ACT inhaler, Inhale 2 puffs into the lungs every 6 (six) hours as needed for wheezing or shortness of breath., Disp: , Rfl:    azelastine (ASTELIN) 0.1 % nasal spray, Place 2 sprays into both nostrils in the morning. Use in each nostril as directed (Patient taking differently: Place 2 sprays into both nostrils as needed. Use in each nostril as directed), Disp: , Rfl:    Cholecalciferol (VITAMIN D3 PO), Take 1 tablet by mouth at bedtime., Disp: ,  Rfl:    fluticasone (FLONASE) 50 MCG/ACT nasal spray, Place 2 sprays into both nostrils in the morning and at bedtime. (Patient taking differently: Place 2 sprays into both nostrils as needed.), Disp: , Rfl:    lovastatin (MEVACOR) 20 MG tablet, TAKE 1 TABLET BY MOUTH AT BEDTIME, Disp: 90 tablet, Rfl: 3   Multiple Vitamins-Minerals (MENS MULTIVITAMIN PLUS) TABS, Take 1 tablet by mouth at bedtime., Disp: , Rfl:    mycophenolate (CELLCEPT) 500 MG tablet, Take 1 tablet by mouth twice daily x 2 weeks. Repeat labs. If stable, increase to 2 tabs by mouth twice daily, Disp: 84 tablet, Rfl: 0   pantoprazole (PROTONIX) 40 MG tablet, Take 1 tablet (40 mg total) by mouth daily. (Patient taking differently: Take 40 mg by mouth as needed.), Disp: 30 tablet, Rfl: 5    predniSONE (DELTASONE) 10 MG tablet, Take 6 tabs once daily x 2 weeks, then 5 tabs daily x 2 weeks, then 4 tabs daily x 2 weeks, then 3 tabs daily x 2 weeks, 2 tabs daily x 2 weeks,, Disp: 294 tablet, Rfl: 0   sulfamethoxazole-trimethoprim (BACTRIM DS) 800-160 MG tablet, Take one tablet by mouth on Mondays, Wednesdays, and Fridays only., Disp: 36 tablet, Rfl: 2      Objective:   Vitals:   01/04/23 1032  Weight: 195 lb (88.5 kg)  Height: 5\' 10"  (1.778 m)    Estimated body mass index is 27.98 kg/m as calculated from the following:   Height as of this encounter: 5\' 10"  (1.778 m).   Weight as of this encounter: 195 lb (88.5 kg).  @WEIGHTCHANGE @  [employer]   01/04/23 1032  Weight: 195 lb (88.5 kg)     Physical Exam   General: No distress.  Looks well on the video O2 at rest: no Cane present: no Sitting in wheel chair: no Frail: no Obese: no Neuro: Alert and Oriented x 3. GCS 15. Speech normal Psych: Pleasant       Assessment:       ICD-10-CM   1. ILD (interstitial lung disease) (HCC)  J84.9     2. Sjogren syndrome with lung involvement (HCC)  M35.02     3. High risk medication use  Z79.899          Plan:     Patient Instructions     ICD-10-CM   1. ILD (interstitial lung disease) (HCC)  J84.9     2. Sjogren syndrome with lung involvement (HCC)  M35.02     3. High risk medication use  Z79.899        ILD (interstitial lung disease) (HCC) ANA positive Dry mouth Lymphocytosis - bAL April 2024  - You have ILD  - Thre are many types to it -Currently it is unclear what type you have although clinically does not seem to be a common type called IPF -The presence of lymphocytes + t he Sjogren antibody + the dry mouth suggest that your condition could be related to Sjogren syndrome   Plan   -As outlined by pharmacy team on 12/24/2022 = Treatment program starting 12/24/2022-      Increase prednisone to 60mg  PO daily x 2 weeks( originally started on 40  mg/day on 12/19/2022], then 50mg  daily x 2 weeks, then 40mg  daily x 2 weeks, then 30 mg daily x 2 weeks and then 20mg  daily x 2 weeks and then 10mg  daily to continue Start Bactrim DS 1 tablet PO Mondays, Wednesdays, Fridays for PJP prophylaxis. Start Cellcept 500mg   PO twice daily x 2 weeks. Repeat CBC and CMP. If stable, increase to 1000mg  twice daily.    Vaccine counseling  Plan  - flu shot in fall - RSV vaccine - if you can pay out of pocket anytime next few months -Prevnar vaccine next few weeks/months  Follow-up - In July through mid August 2024 with pulmonary function testing spirometry and DLCO; 30-minute visit      SIGNATURE    Dr. Kalman Shan, M.D., F.C.C.P,  Pulmonary and Critical Care Medicine Staff Physician, [facility] Center Director - Interstitial Lung Disease  Program  Pulmonary Fibrosis [facility] [place], [place], [address]  Pager: [phone], If no answer or between  15:00h - 7:00h: call [phone] Telephone: [phone]  5:26 PM 01/04/2023

## 2023-01-09 ENCOUNTER — Other Ambulatory Visit: Payer: Self-pay | Admitting: *Deleted

## 2023-01-09 DIAGNOSIS — Z79899 Other long term (current) drug therapy: Secondary | ICD-10-CM

## 2023-01-09 DIAGNOSIS — M3509 Sicca syndrome with other organ involvement: Secondary | ICD-10-CM

## 2023-01-09 DIAGNOSIS — R768 Other specified abnormal immunological findings in serum: Secondary | ICD-10-CM

## 2023-01-09 DIAGNOSIS — J849 Interstitial pulmonary disease, unspecified: Secondary | ICD-10-CM

## 2023-01-10 ENCOUNTER — Telehealth: Payer: Self-pay | Admitting: Internal Medicine

## 2023-01-10 ENCOUNTER — Telehealth: Payer: Self-pay

## 2023-01-10 DIAGNOSIS — J849 Interstitial pulmonary disease, unspecified: Secondary | ICD-10-CM | POA: Diagnosis not present

## 2023-01-10 DIAGNOSIS — R768 Other specified abnormal immunological findings in serum: Secondary | ICD-10-CM | POA: Diagnosis not present

## 2023-01-10 DIAGNOSIS — Z79899 Other long term (current) drug therapy: Secondary | ICD-10-CM | POA: Diagnosis not present

## 2023-01-10 DIAGNOSIS — M3509 Sicca syndrome with other organ involvement: Secondary | ICD-10-CM | POA: Diagnosis not present

## 2023-01-10 NOTE — Telephone Encounter (Signed)
Patient contacted the office and states he is on the 5 tabs daily (50 MG) part of his prednisone taper. Patient states he does not have enough tablets to finish out his taper. Patient states he was sent in 140 pills on 12/19/2022. Patient states he will need more to finish out his taper because he does not even have enough to finish out the 5 tabs for two weeks. Patient states he [facility] and has not gotten a response. After reviewing the chart, Dr. Marchelle Gearing sent in a no print of 294 tablets on 12/25/2022. Patient states Dr. Marchelle Gearing and Dr. Corliss Skains are working together on his prednisone. Patient states he would like the prednisone sent to [facility] on [address] In [place]. Patient call back number is [phone]. Please advise.

## 2023-01-10 NOTE — Telephone Encounter (Signed)
Patient states needs more Prednisone. Needs 185 more tablets. Pharmacy is [facility] [address] [place]. Patient phone number is [phone].

## 2023-01-10 NOTE — Telephone Encounter (Signed)
Patient stated that he was trying to reach [facility].  I explained to him that [facility] is not in the office.  He will contact Dr. Jane Canary office.

## 2023-01-11 LAB — CBC WITH DIFFERENTIAL/PLATELET
Basos: 0 %
Eos: 0 %
Lymphocytes Absolute: 2.5 10*3/uL (ref 0.7–3.1)
Lymphs: 33 %
MCHC: 33.7 g/dL (ref 31.5–35.7)
RDW: 12.1 % (ref 11.6–15.4)

## 2023-01-11 LAB — PROTEIN ELECTROPHORESIS, SERUM

## 2023-01-11 LAB — CMP14+EGFR
ALT: 21 IU/L (ref 0–44)
BUN: 21 mg/dL (ref 6–24)
Sodium: 140 mmol/L (ref 134–144)
Total Protein: 6.5 g/dL (ref 6.0–8.5)

## 2023-01-11 LAB — C3 AND C4: Complement C3, Serum: 132 mg/dL (ref 82–167)

## 2023-01-11 LAB — BETA-2-GLYCOPROTEIN I ABS, IGG/M/A

## 2023-01-11 LAB — ANTI-SMITH ANTIBODY

## 2023-01-11 LAB — CARDIOLIPIN ANTIBODIES, IGG, IGM, IGA

## 2023-01-12 LAB — CBC WITH DIFFERENTIAL/PLATELET
Basophils Absolute: 0 10*3/uL (ref 0.0–0.2)
Immature Grans (Abs): 0 10*3/uL (ref 0.0–0.1)
Immature Granulocytes: 0 %
MCH: 32.7 pg (ref 26.6–33.0)
Neutrophils Absolute: 4.4 10*3/uL (ref 1.4–7.0)
Neutrophils: 58 %

## 2023-01-12 LAB — CMP14+EGFR
AST: 15 IU/L (ref 0–40)
Calcium: 9.2 mg/dL (ref 8.7–10.2)
Glucose: 87 mg/dL (ref 70–99)

## 2023-01-12 LAB — CK: Total CK: 23 U/L — ABNORMAL LOW (ref 41–331)

## 2023-01-12 NOTE — Telephone Encounter (Signed)
Ok his prednisone dose was readjusted. And I though pharmacy would have sent the correct refill. So please pay close attention that taper. You need to check what dosing he is on currently and send accordingly. Take Pharmacy help if neeeded

## 2023-01-12 NOTE — Telephone Encounter (Signed)
MR, please advise if you are okay with this.

## 2023-01-13 LAB — CBC WITH DIFFERENTIAL/PLATELET
Hemoglobin: 14.6 g/dL (ref 13.0–17.7)
MCV: 97 fL (ref 79–97)
Monocytes: 9 %
RBC: 4.46 x10E6/uL (ref 4.14–5.80)
WBC: 7.6 10*3/uL (ref 3.4–10.8)

## 2023-01-13 LAB — PROTEIN ELECTROPHORESIS, SERUM

## 2023-01-13 LAB — CMP14+EGFR
BUN/Creatinine Ratio: 20 (ref 9–20)
Bilirubin Total: 0.9 mg/dL (ref 0.0–1.2)
Creatinine, Ser: 1.06 mg/dL (ref 0.76–1.27)
Potassium: 4.5 mmol/L (ref 3.5–5.2)

## 2023-01-15 ENCOUNTER — Telehealth: Payer: Self-pay | Admitting: Internal Medicine

## 2023-01-15 ENCOUNTER — Telehealth: Payer: Self-pay

## 2023-01-15 DIAGNOSIS — J849 Interstitial pulmonary disease, unspecified: Secondary | ICD-10-CM

## 2023-01-15 DIAGNOSIS — Z2989 Encounter for other specified prophylactic measures: Secondary | ICD-10-CM

## 2023-01-15 MED ORDER — PREDNISONE 10 MG PO TABS: ORAL_TABLET | ORAL | 0 refills | Status: DC

## 2023-01-15 NOTE — Telephone Encounter (Signed)
Contacted the patient and advised It appears that Dr. Marchelle Gearing is prescribing cellcept. Advised patient to contact Dr. Jane Canary office. Patient verbalized understanding.

## 2023-01-15 NOTE — Telephone Encounter (Signed)
Please see Dr. Charlean Sanfilippo inst below and past tel enocunters to [facility]. PT needs this Pred today. Please call PT ASAP. Dosage needs to be verified w/PT before sending in script please.   Has 8 days left of the 50mg  andthem it's 14 days of each 20-30-40-10 (?)  Pharmacy is [facility] [address] [place]. Patient phone number is [phone].

## 2023-01-15 NOTE — Telephone Encounter (Signed)
Patient contacted the office and states last week he was told after labs, if the results came back okay, he would be doubling his cellcept medication. Patient states he was told the Doctor wanted to ensure his body was tolerating the medication okay before increasing the patient's dose. Patient inquires if he is supposed to be increasing his medication dose. Patient call back number is [phone]. Please advise.

## 2023-01-15 NOTE — Telephone Encounter (Signed)
It appears that Dr. Marchelle Gearing is prescribing cellcept.

## 2023-01-15 NOTE — Telephone Encounter (Signed)
Called and spoke with patient. He stated that he needs 8 more days of the 50mg  dose and then the rest of the taper. I advised him that I would go ahead and send in the remaining doses for him, he verbalized understanding.   Nothing further needed at time of call.

## 2023-01-16 LAB — CMP14+EGFR
Albumin: 4.2 g/dL (ref 3.8–4.9)
Alkaline Phosphatase: 34 IU/L — ABNORMAL LOW (ref 44–121)
CO2: 24 mmol/L (ref 20–29)
Chloride: 101 mmol/L (ref 96–106)
Globulin, Total: 2.3 g/dL (ref 1.5–4.5)
eGFR: 81 mL/min/{1.73_m2} (ref >59)

## 2023-01-16 LAB — C3 AND C4: Complement C4, Serum: 15 mg/dL (ref 12–38)

## 2023-01-16 LAB — CBC WITH DIFFERENTIAL/PLATELET
EOS (ABSOLUTE): 0 10*3/uL (ref 0.0–0.4)
Hematocrit: 43.3 % (ref 37.5–51.0)
Monocytes Absolute: 0.7 10*3/uL (ref 0.1–0.9)
Platelets: 256 10*3/uL (ref 150–450)

## 2023-01-16 LAB — PROTEIN ELECTROPHORESIS, SERUM
A/G Ratio: 1.2 (ref 0.7–1.7)
Albumin ELP: 3.6 g/dL (ref 2.9–4.4)
Alpha 1: 0.2 g/dL (ref 0.0–0.4)
Beta: 1 g/dL (ref 0.7–1.3)
Globulin, Total: 2.9 g/dL (ref 2.2–3.9)

## 2023-01-16 LAB — BETA-2-GLYCOPROTEIN I ABS, IGG/M/A: Beta-2 Glyco 1 IgA: <9 GPI IgA units (ref 0–25)

## 2023-01-16 LAB — CARDIOLIPIN ANTIBODIES, IGG, IGM, IGA
Anticardiolipin IgA: <9 APL U/mL (ref 0–11)
Anticardiolipin IgM: <9 MPL U/mL (ref 0–12)

## 2023-01-16 NOTE — Progress Notes (Signed)
CMP stable, CK normal, SPEP unremarkable, CBC normal, complements normal, Smith negative, beta-2 GP 1 negative, anticardiolipin negative.

## 2023-01-16 NOTE — Telephone Encounter (Signed)
Spoke to pt and he had questions about cellcept and his lab results that Dr Corliss Skains, Janalyn Rouse had ordered. I informed pt that Dr Marchelle Gearing is on vacation this week and he would have to wait for Dr Fatima Sanger office to f/up on the labs. But cellcept I will send communication to our pharmacy team to get his questions answered. Pt verbalized understanding. Nothing further needed.

## 2023-01-17 MED ORDER — MYCOPHENOLATE MOFETIL 500 MG PO TABS
1000.0000 mg | ORAL_TABLET | Freq: Two times a day (BID) | ORAL | 0 refills | Status: DC
Start: 2023-01-17 — End: 2023-04-26

## 2023-01-17 NOTE — Telephone Encounter (Addendum)
Patient aware to increase Cellcept to 1000mg  twice daily. Rx sent to [facility] pharmacy today.  Chesley Mires, PharmD, MPH, BCPS, CPP Clinical Pharmacist (Rheumatology and Pulmonology)

## 2023-01-17 NOTE — Addendum Note (Signed)
Addended by: Murrell Redden on: 01/17/2023 03:18 PM   Modules accepted: Orders

## 2023-01-18 NOTE — Progress Notes (Signed)
Office Visit Note  Patient: Joseph Hendricks             Date of Birth: 02-21-1966           MRN: [id number]             PCP: Bosie Clos, MD Referring: Bosie Clos, MD Visit Date: 02/01/2023 Occupation: @GUAROCC @  Subjective:  Medication management  History of Present Illness: Joseph Hendricks is a 57 y.o. male send interstitial lung disease.  He states that he increase the dose of CellCept to 500 mg 2 tablets twice a day about 2 weeks ago.  He has been also on the tapering dose of prednisone and started prednisone 40 mg p.o. every morning about a week ago.  He will be tapering prednisone by 10 mg every 2 weeks.  He continues to be on Bactrim prophylaxis on Monday Wednesday and Friday.  He has not noticed any increased side effects from CellCept.  He has noticed some heat intolerance.  He continues to have insomnia from prednisone use.  He has mild dry mouth symptoms which she is uncertain if they are due to medication use.  He denies symptoms of dry eyes.  He denies any increased shortness of breath. He denies joint pain or muscle pain.    Activities of Daily Living:  Patient reports morning stiffness for 0 minutes.   Patient Denies nocturnal pain.  Difficulty dressing/grooming: Denies Difficulty climbing stairs: Denies Difficulty getting out of chair: Denies Difficulty using hands for taps, buttons, cutlery, and/or writing: Denies  Review of Systems  Constitutional:  Negative for fatigue.  HENT:  Negative for mouth sores, trouble swallowing, trouble swallowing and mouth dryness.   Eyes:  Negative for dryness.  Respiratory:  Negative for shortness of breath.   Cardiovascular:  Negative for chest pain and palpitations.  Gastrointestinal:  Negative for blood in stool, constipation and diarrhea.  Endocrine: Positive for heat intolerance. Negative for increased urination.  Genitourinary:  Negative for painful urination and involuntary urination.  Musculoskeletal:   Negative for joint pain, gait problem, joint pain, joint swelling, myalgias, muscle weakness, morning stiffness, muscle tenderness and myalgias.  Skin:  Negative for color change, rash, hair loss and sensitivity to sunlight.  Allergic/Immunologic: Negative for susceptible to infections.  Neurological:  Positive for tremors and headaches. Negative for dizziness.  Hematological:  Negative for swollen glands.  Psychiatric/Behavioral:  Positive for sleep disturbance. Negative for depressed mood. The patient is not nervous/anxious.     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   in [place] - patient is not aware of this dx as of 11/03/22   Asthma    patient denies this dx as 11/03/22   Chronic cough 10/2022   GERD (gastroesophageal reflux disease)    HLD (hyperlipidemia)    Interstitial lung disease (HCC) 10/2022   Pneumonia    x 1   Seasonal allergies  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   right forearm, left wrist, calf   Tuberculosis    patient denies this dx as of 11/03/22    Family History  Problem Relation Age of Onset   Arthritis Mother        osteo   Factor V Leiden deficiency Mother    Diabetes Father    Alzheimer's disease Father    Healthy Sister    Allergies Brother    Cancer Brother        testicular   Healthy Brother    Healthy Son    Healthy  Daughter    Healthy Daughter    Past Surgical History:  Procedure Laterality Date   APPENDECTOMY     BRONCHIAL BIOPSY  11/06/2022   Procedure: BRONCHIAL BIOPSIES;  Surgeon: Leslye Peer, MD;  Location: [facility] ENDOSCOPY;  Service: Pulmonary;;   BRONCHIAL WASHINGS  11/06/2022   Procedure: BRONCHIAL WASHINGS;  Surgeon: Leslye Peer, MD;  Location: [facility]OS[facility];  Service: Pulmonary;;   COLONOSCOPY     HERNIA REPAIR     KNEE SURGERY Left    SHOULDER SURGERY Right    SQUAMOUS CELL CARCINOMA EXCISION     x3 or 4 per patient, bilateral arms, back and leg   VIDEO BRONCHOSCOPY Bilateral 11/06/2022   Procedure: VIDEO BRONCHOSCOPY WITH FLUORO;  Surgeon: Leslye Peer, MD;  Location: [facility] ENDOSCOPY;  Service: Pulmonary;  Laterality: Bilateral;   WISDOM TOOTH EXTRACTION     Social History   Social History Narrative   Not on file   Immunization History  Administered Date(s) Administered   Covid-19, Mrna,Vaccine(Spikevax)50yrs and older 05/30/2022   Hepatitis B 01/24/2002   Influenza Inj Mdck Quad Pf 05/30/2022   Influenza,inj,Quad PF,6+ Mos 04/12/2016, 04/28/2020, 05/04/2021   Influenza-Unspecified 04/10/2015   Moderna Covid-19 Vaccine Bivalent Booster 51yrs & up 05/19/2021   Moderna Sars-Covid-2 Vaccination 09/17/2019, 10/17/2019, 05/28/2020   Pneumococcal Polysaccharide-23 06/09/2014   Td 04/23/2018   Tdap 02/03/2008   Typhoid Inactivated 01/24/2002   Zoster Recombinant(Shingrix) 04/28/2020     Objective: Vital Signs: BP 132/87 (BP Location: Left Arm, Patient Position: Sitting, Cuff Size: Normal)   Pulse 76   Resp 16   Ht 5\' 10"  (1.778 m)   Wt 195 lb 12.8 oz (88.8 kg)   BMI 28.09 kg/m    Physical Exam Vitals and nursing note reviewed.  Constitutional:      Appearance: He is well-developed.  HENT:     Head: Normocephalic and atraumatic.  Eyes:     Conjunctiva/sclera: Conjunctivae normal.     Pupils: Pupils are equal, round, and reactive to light.  Cardiovascular:      Rate and Rhythm: Normal rate and regular rhythm.     Heart sounds: Normal heart sounds.  Pulmonary:     Effort: Pulmonary effort is normal.     Breath sounds: Normal breath sounds.     Comments: Crackles in bilateral lung bases Abdominal:     General: Bowel sounds are normal.     Palpations: Abdomen is soft.  Musculoskeletal:     Cervical back: Normal range of motion and neck supple.  Skin:    General: Skin is warm and dry.     Capillary Refill: Capillary refill takes 2 to 3 seconds.  Neurological:     Mental Status: He is alert and oriented to person, place, and time.  Psychiatric:        Behavior: Behavior normal.  Musculoskeletal Exam: Cervical, thoracic and lumbar spine were in good range of motion.  Shoulder joints, elbow joints, wrist joints, MCPs PIPs and DIPs were in good range of motion with no synovitis.  Hip joints and knee joints in good range of motion without any warmth swelling or effusion.  There was no tenderness over ankles or MTPs.  CDAI Exam: CDAI Score: -- Patient Global: --; Provider Global: -- Swollen: --; Tender: -- Joint Exam 02/01/2023   No joint exam has been documented for this visit   There is currently no information documented on the homunculus. Go to the Rheumatology activity and complete the homunculus joint exam.  Investigation: No additional findings.  Imaging: No results found.  Recent Labs: Lab Results  Component Value Date   WBC 7.6 01/10/2023   HGB 14.6 01/10/2023   PLT 256 01/10/2023   NA 140 01/10/2023   K 4.5 01/10/2023   CL 101 01/10/2023   CO2 24 01/10/2023   GLUCOSE 87 01/10/2023   BUN 21 01/10/2023   CREATININE 1.06 01/10/2023   BILITOT 0.9 01/10/2023   ALKPHOS 34 (L) 01/10/2023   AST 15 01/10/2023   ALT 21 01/10/2023   PROT 6.5 01/10/2023   ALBUMIN 4.2 01/10/2023   CALCIUM 9.2 01/10/2023   GFRAA 101 04/29/2020   QFTBGOLDPLUS NEGATIVE 12/19/2022   January 10, 2023 SPEP normal, beta-2 GP 1 negative,  anticardiolipin negative, C3-C4 normal, CK23, Smith Ab negative   October 27, 2022 myositis panel negative except anti-SSA 52 Kd> 200, ANA 1: 640 NS, SSA antibody 8.0, SSB 1.2, dsDNA negative, RNP negative, SCL 70 negative, RF negative, anti-CCP negative, Jo 1 negative, ANCA negative   December 19, 2022 hepatitis B-, hepatitis C negative, HIV negative, TB Gold negative, ESR 16, vitamin D 30, double-stranded DNA negative, RNP negative, SCL 70 negative, SSA> 8.0, SSB 2.2, ANA 1: 1280NS, ESR 16, vitamin D 30, G6PD 15.6, TPMT 15  Speciality Comments: No specialty comments available.  Procedures:  No procedures performed Allergies: Patient has no known allergies.   Assessment / Plan:     Visit Diagnoses: ILD (interstitial lung disease) (HCC) - NSIP pattern.  Patient has been on tapering dose of prednisone and reduce the dose of prednisone to 40 mg p.o. daily about a week ago.  CellCept dose was increased about 2 weeks ago to 500 mg, 2 tablets p.o. twice daily.  He started CellCept 500 mg twice daily in mid June.  Symptoms improved on prednisone per patient.  He denies shortness of breath, joint pain or myalgias today.  He has been tolerating CellCept without any side effects.  He continues to have some side effects from prednisone which include heat intolerance and tremors.  He had crackles in bilateral lung bases.  He had decreased capillary refill in his hands.  Sjogren's syndrome with other organ involvement (HCC) - Positive ANA, positive SSA antibody, positive SSB antibody.  Mild dry mouth symptoms.  He denies symptoms of dry eyes.  EKG was advised.  High risk medication use - CellCept 500 mg p.o., 2 tablets p.o. twice daily (prescribed by Dr. Colletta Maryland) Bactrim DS 3 times a week.  Prednisone 40 mg p.o. daily.  Labs obtained on January 10, 2023 CBC and CMP were normal.  His dose of CellCept was increased 2 weeks ago.  I will check CBC and CMP today.  If he does not increase the dose of CellCept further then  the labs could be monitored every 3 months.  If he increases the dose  of CellCept then he will have repeat labs in 2 weeks which was discussed with the patient.  Information for immunization was placed in the AVS.  He was advised to get immunization when his prednisone doses below 20 mg p.o. daily.  Long-term current use of systemic steroids-hemoglobin A1c was 5.2 on December 19, 2022.  He has been tapering prednisone.  Side effects of prednisone were reviewed.  Mild intermittent asthma without complication - Mild exercise-induced asthma.  He has been walking 2 miles 4 times a week.  He plays golf and hikes.  He did not have to use inhaler recently.  Other medical problems are listed as follows:  Seasonal allergic rhinitis, unspecified trigger  Gastroesophageal reflux disease without esophagitis - Pantoprazole as needed  Benign fibroma of prostate  Bergmann's syndrome  Basal cell carcinoma (BCC), unspecified site - Followed by dermatology.  Hypercholesteremia-increase history of heart disease with autoimmune disease was discussed.  Dietary modifications and exercise was emphasized.  Patient i stays active.  Family history of neurological disease-Alzheimer's in his father and paternal grandmother  Orders: Orders Placed This Encounter  Procedures   CBC with Differential/Platelet   COMPLETE METABOLIC PANEL WITH GFR   No orders of the defined types were placed in this encounter.    Follow-Up Instructions: Return in about 3 months (around 05/04/2023) for Sjogren's,ILD.   Pollyann Savoy, MD  Note - This record has been created using Animal nutritionist.  Chart creation errors have been sought, but may not always  have been located. Such creation errors do not reflect on  the standard of medical care.

## 2023-02-01 ENCOUNTER — Ambulatory Visit: Payer: BC Managed Care – PPO | Attending: Rheumatology | Admitting: Rheumatology

## 2023-02-01 ENCOUNTER — Encounter: Payer: Self-pay | Admitting: Rheumatology

## 2023-02-01 VITALS — BP 132/87 | HR 76 | Resp 16 | Ht 70.0 in | Wt 195.8 lb

## 2023-02-01 DIAGNOSIS — J4520 Mild intermittent asthma, uncomplicated: Secondary | ICD-10-CM

## 2023-02-01 DIAGNOSIS — K449 Diaphragmatic hernia without obstruction or gangrene: Secondary | ICD-10-CM

## 2023-02-01 DIAGNOSIS — J849 Interstitial pulmonary disease, unspecified: Secondary | ICD-10-CM

## 2023-02-01 DIAGNOSIS — Z820 Family history of epilepsy and other diseases of the nervous system: Secondary | ICD-10-CM

## 2023-02-01 DIAGNOSIS — Z7952 Long term (current) use of systemic steroids: Secondary | ICD-10-CM

## 2023-02-01 DIAGNOSIS — Z79899 Other long term (current) drug therapy: Secondary | ICD-10-CM | POA: Diagnosis not present

## 2023-02-01 DIAGNOSIS — N400 Enlarged prostate without lower urinary tract symptoms: Secondary | ICD-10-CM

## 2023-02-01 DIAGNOSIS — M3509 Sicca syndrome with other organ involvement: Secondary | ICD-10-CM

## 2023-02-01 DIAGNOSIS — K219 Gastro-esophageal reflux disease without esophagitis: Secondary | ICD-10-CM

## 2023-02-01 DIAGNOSIS — C4491 Basal cell carcinoma of skin, unspecified: Secondary | ICD-10-CM

## 2023-02-01 DIAGNOSIS — J302 Other seasonal allergic rhinitis: Secondary | ICD-10-CM

## 2023-02-01 DIAGNOSIS — E7800 Pure hypercholesterolemia, unspecified: Secondary | ICD-10-CM

## 2023-02-01 LAB — CBC WITH DIFFERENTIAL/PLATELET
Absolute Monocytes: 287 cells/uL (ref 200–950)
Basophils Absolute: 9 cells/uL (ref 0–200)
Basophils Relative: 0.1 %
Eosinophils Absolute: 0 cells/uL — ABNORMAL LOW (ref 15–500)
Eosinophils Relative: 0 %
HCT: 43.4 % (ref 38.5–50.0)
Hemoglobin: 15.1 g/dL (ref 13.2–17.1)
Lymphs Abs: 653 cells/uL — ABNORMAL LOW (ref 850–3900)
MCH: 32.5 pg (ref 27.0–33.0)
MCHC: 34.8 g/dL (ref 32.0–36.0)
MCV: 93.3 fL (ref 80.0–100.0)
MPV: 10.1 fL (ref 7.5–12.5)
Monocytes Relative: 3.3 %
Neutro Abs: 7752 cells/uL (ref 1500–7800)
Neutrophils Relative %: 89.1 %
Platelets: 238 10*3/uL (ref 140–400)
RBC: 4.65 10*6/uL (ref 4.20–5.80)
RDW: 12.1 % (ref 11.0–15.0)
Total Lymphocyte: 7.5 %
WBC: 8.7 10*3/uL (ref 3.8–10.8)

## 2023-02-01 NOTE — Patient Instructions (Addendum)
Standing Labs We placed an order today for your standing lab work.   Please have your standing labs drawn in October and every 3 months  Please have your labs drawn 2 weeks prior to your appointment so that the provider can discuss your lab results at your appointment, if possible.  Please note that you may see your imaging and lab results in MyChart before we have reviewed them. We will contact you once all results are reviewed. Please allow our office up to 72 hours to thoroughly review all of the results before contacting the office for clarification of your results.  WALK-IN LAB HOURS  Monday through Thursday from 8:00 am -12:30 pm and 1:00 pm-5:00 pm and Friday from 8:00 am-12:00 pm.  Patients with office visits requiring labs will be seen before walk-in labs.  You may encounter longer than normal wait times. Please allow additional time. Wait times may be shorter on  Monday and Thursday afternoons.  We do not book appointments for walk-in labs. We appreciate your patience and understanding with our staff.   Labs are drawn by [facility]. Please bring your co-pay at the time of your lab draw.  You may receive a bill from [facility] for your lab work.  Please note if you are on Hydroxychloroquine and and an order has been placed for a Hydroxychloroquine level,  you will need to have it drawn 4 hours or more after your last dose.  If you wish to have your labs drawn at another location, please call the office 24 hours in advance so we can fax the orders.  The office is located at [address], [place], [place] [address]   If you have any questions regarding directions or hours of operation,  please call [phone].   As a reminder, please drink plenty of water prior to coming for your lab work. Thanks!  Vaccines You are taking a medication(s) that can suppress your immune system.  The following immunizations are recommended: (The dose of prednisone has to be less than 20 mg to  get vaccination) Flu annually Covid-19  RSV Td/Tdap (tetanus, diphtheria, pertussis) every 10 years Pneumonia (Prevnar 15 then Pneumovax 23 at least 1 year apart.  Alternatively, can take Prevnar 20 without needing additional dose) Shingrix: 2 doses from 4 weeks to 6 months apart  Please check with your PCP to make sure you are up to date.   If you have signs or symptoms of an infection or start antibiotics: First, call your PCP for workup of your infection. Hold your medication through the infection, until you complete your antibiotics, and until symptoms resolve if you take the following: Injectable medication (Actemra, Benlysta, Cimzia, Cosentyx, Enbrel, Humira, Kevzara, Orencia, Remicade, Simponi, Stelara, Taltz, Tremfya) Methotrexate Leflunomide (Arava) Mycophenolate (Cellcept) Harriette Ohara, Olumiant, or Rinvoq   Heart Disease Prevention   Your inflammatory disease increases your risk of heart disease which includes heart attack, stroke, atrial fibrillation (irregular heartbeats), high blood pressure, heart failure and atherosclerosis (plaque in the arteries).  It is important to reduce your risk by:   Keep blood pressure, cholesterol, and blood sugar at healthy levels   Smoking Cessation   Maintain a healthy weight  BMI 20-25   Eat a healthy diet  Plenty of fresh fruit, vegetables, and whole grains  Limit saturated fats, foods high in sodium, and added sugars  DASH and Mediterranean diet   Increase physical activity  Recommend moderate physically activity for 150 minutes per week/ 30 minutes a day  for five days a week These can be broken up into three separate ten-minute sessions during the day.   Reduce Stress  Meditation, slow breathing exercises, yoga, coloring books  Dental visits twice a year

## 2023-02-02 ENCOUNTER — Telehealth: Payer: Self-pay | Admitting: Internal Medicine

## 2023-02-02 LAB — COMPLETE METABOLIC PANEL WITH GFR: Chloride: 104 mmol/L (ref 98–110)

## 2023-02-02 NOTE — Progress Notes (Signed)
CBC and CMP are stable.  Lymphocyte count is low due to immunosuppression.

## 2023-02-02 NOTE — Telephone Encounter (Signed)
PT had some blood work done at his Rheumatologist and it raised certain levels that may require a medication change on his Cellcepp .   Please call to advise what Dr. Elvera Lennox would like to do in this case. Thank you.   His # is [phone]

## 2023-02-06 NOTE — Telephone Encounter (Signed)
Patient checking on message sent for medication change. Patient phone number is [phone].

## 2023-02-09 NOTE — Telephone Encounter (Signed)
Called and spoke with patient. I advised him that we have sent the message to MR and are waiting on a response from him. He verbalized understanding.   He wanted to know if the bloodwork would give him clearance to increase the Cellcept to "3000mg ".   MR, can you please advise? Thanks!

## 2023-02-09 NOTE — Telephone Encounter (Signed)
LAbs 02/01/23 are fine but for slight increase in blood sugar but he is on tapering prednisone  Plan  - stay on cellcept 1000mg  bid - till I see him, I do not want to increase. I do not see a rush for it  - continue prednisone taper - needs spiro/dlco (asked for it in July/aug 2024) but atleast in sept 2024 - give appt to see me in aug/sept 2024 face to face ideally after spiro/dlco ; 30 minm    SIGNATURE    Dr. Kalman Shan, M.D., F.C.C.P,  Pulmonary and Critical Care Medicine Staff Physician, [facility] Director - Interstitial Lung Disease  Program  Pulmonary Fibrosis [facility] at [facility] [place], [place], [address]   Pager: [phone], If no answer  -> Check AMION or Try [phone] Telephone (clinical office): [phone] Telephone (research): [phone]  9:17 PM 02/09/2023        Latest Ref Rng & Units 12/05/2022   10:59 AM  PFT Results  FVC-Pre L 3.41   FVC-Predicted Pre % 70   FVC-Post L 3.44   FVC-Predicted Post % 71   Pre FEV1/FVC % % 81   Post FEV1/FCV % % 84   FEV1-Pre L 2.77   FEV1-Predicted Pre % 75   FEV1-Post L 2.89   DLCO uncorrected ml/min/mmHg 19.84   DLCO UNC% % 70   DLCO corrected ml/min/mmHg 19.95   DLCO COR %Predicted % 71   DLVA Predicted % 95   TLC L 5.24   TLC % Predicted % 74   RV % Predicted % 89         Latest Reference Range & Units 01/10/23 07:24 02/01/23 10:21  Sodium 135 - 146 mmol/L 140 138  Potassium 3.5 - 5.3 mmol/L 4.5 4.6  Chloride 98 - 110 mmol/L 101 104  CO2 20 - 32 mmol/L 24 25  Glucose 65 - 99 mg/dL 87 425 (H)  BUN 7 - 25 mg/dL 21 22  Creatinine 9.56 - 1.30 mg/dL 3.87 5.64  Calcium 8.6 - 10.3 mg/dL 9.2 9.6  BUN/Creatinine Ratio 6 - 22 (calc) 20 SEE NOTE:  eGFR > OR = 60 mL/min/1.83m2 81 90  Alkaline Phosphatase 44 - 121 IU/L 34 (L)   Albumin 3.8 - 4.9 g/dL 4.2   AG Ratio 1.0 - 2.5 (calc)  1.7  AST 10 - 35 U/L 15 13  ALT 9 - 46 U/L 21 21  Total Protein 6.1  - 8.1 g/dL 6.5 6.8  Total Bilirubin 0.2 - 1.2 mg/dL 0.9 1.0  CK Total 41 - 331 U/L 23 (L)   Alkaline phosphatase (APISO) 35 - 144 U/L  28 (L)  Interpretation  Comment   Albumin ELP 2.9 - 4.4 g/dL 3.6   Alpha 1 0.0 - 0.4 g/dL 0.2   Alpha 2 0.4 - 1.0 g/dL 0.6   Beta 0.7 - 1.3 g/dL 1.0   Globulin 1.9 - 3.7 g/dL (calc)  2.5  Globulin, Total 2.2 - 3.9 g/dL 1.5 - 4.5 g/dL 2.9 2.3   A/G Ratio 0.7 - 1.7  1.2   Gamma Globulin 0.4 - 1.8 g/dL 1.1   M-SPIKE, % Not Observed g/dL Not Observed   Please Note:  Comment   WBC 3.8 - 10.8 Thousand/uL 7.6 8.7  RBC 4.20 - 5.80 Million/uL 4.46 4.65  Hemoglobin 13.2 - 17.1 g/dL 33.2 95.1  HCT 88.4 - 16.6 % 43.3 43.4  MCV 80.0 - 100.0 fL 97  93.3  MCH 27.0 - 33.0 pg 32.7 32.5  MCHC 32.0 - 36.0 g/dL 95.2 84.1  RDW 32.4 - 40.1 % 12.1 12.1  Platelets 140 - 400 Thousand/uL 256 238  MPV 7.5 - 12.5 fL  10.1  Neutrophils % 58 89.1  Monocytes Relative %  3.3  Eosinophil %  0.0  Basophil %  0.1  Immature Granulocytes Not Estab. % 0   NEUT# 1,500 - 7,800 cells/uL 4.4 7,752  Lymphocyte # 850 - 3,900 cells/uL 2.5 653 (L)  Total Lymphocyte %  7.5  Monocytes Absolute 0.1 - 0.9 x10E3/uL 0.7   Eosinophils Absolute 15 - 500 cells/uL  0 (L)  Basophils Absolute 0 - 200 cells/uL 0.0 9  Immature Grans (Abs) 0.0 - 0.1 x10E3/uL 0.0   Lymphs Not Estab. % 33   Monocytes Not Estab. % 9   Basos Not Estab. % 0   Eos Not Estab. % 0   EOS (ABSOLUTE) 0.0 - 0.4 x10E3/uL 0.0   Absolute Monocytes 200 - 950 cells/uL  287  Anticardiolipin Ab,IgA,Qn 0 - 11 APL U/mL <9   Anticardiolipin Ab,IgG,Qn 0 - 14 GPL U/mL <9   Anticardiolipin Ab,IgM,Qn 0 - 12 MPL U/mL <9   Beta-2 Glycoprotein I Ab, IgG 0 - 20 GPI IgG units <9   Beta-2 Glyco 1 IgM 0 - 32 GPI IgM units <9   Beta-2 Glyco 1 IgA 0 - 25 GPI IgA units <9   ENA SM Ab Ser-aCnc 0.0 - 0.9 AI <0.2   Complement C3, Serum 82 - 167 mg/dL 027   Complement C4, Serum 12 - 38 mg/dL 15   Albumin MSPROF 3.6 - 5.1 g/dL  4.3  (H): Data is  abnormally high (L): Data is abnormally low

## 2023-02-12 NOTE — Telephone Encounter (Signed)
Called and spoke with pt regarding lab results. He verbalized understanding. Pt has been scheduled for pft and OV. Mr. Joseph Hendricks is headed on a trip this month, he is still on prednisone which is keeping him from getting vaccinated at this time. Mr. Joseph Hendricks was concerned about how this would effect his trip due to him being around crowds. Should he wear a mask, use precautions or should he just carry on as normal. I have advised pt if he is immune compromised he should always wear a mask in crowds,vaccination or not. Dr. Marchelle Gearing do you have any recommendations.   Pt will get vaccinated after he has completed his prednisone taper

## 2023-02-19 ENCOUNTER — Telehealth: Payer: Self-pay | Admitting: Internal Medicine

## 2023-02-19 ENCOUNTER — Encounter: Payer: Self-pay | Admitting: Internal Medicine

## 2023-02-20 ENCOUNTER — Telehealth: Payer: Self-pay | Admitting: Internal Medicine

## 2023-02-20 NOTE — Telephone Encounter (Signed)
PT states he needs a letter signed by a Dr. or Nurse in order for him to be able to cancel airline tickets w/o penalty. Letter can be very simple and addressed to him.  In talking to the nurse because he is on a higher dose of Pred he can not take the immunizations needed in order to travel to [place] since the area he will be in has a very high concentration of people and being in a cabin with lots of other people. (I believe he mentioned being immunosuppressed.)  He had to change his travel plans after hearing this until he gets off this higher dosage.  Dr. Elvera Lennox I can make this letter and put it in your box for signature if you like. It would need to be fax'd to the attention of Marcelo Mcconnel @[phone]

## 2023-02-21 NOTE — Telephone Encounter (Signed)
Yes this is fine. Address this to him as requested and advice travel and clustering with people when immunesuppressed is strongly not advised until further notice

## 2023-02-22 ENCOUNTER — Encounter: Payer: Self-pay | Admitting: Internal Medicine

## 2023-02-22 NOTE — Telephone Encounter (Signed)
Composed letter. Had Heather Fulton-Brame sign as Charity fundraiser. Faxed as directed. Confirmed fax rec'd. Notified Mr. Rebich who was grateful to Dr.  Rexene Alberts

## 2023-02-23 NOTE — Telephone Encounter (Signed)
Letter faxed to [phone]

## 2023-03-09 NOTE — Telephone Encounter (Signed)
Missed this message  He can  get any inactiviated (not live virus) vaccine and infact should even if he is on prednisone  Plan - flu shot in fall - Prevnar- the new one - RSV vaccine not indicated right now and can discuss at followup - for travel - mask in crowds and indoors - for restaurant: sit outdoors to extent possible and unmaks only for eating and avoid crowded times. Mask in plane   Immunization History  Administered Date(s) Administered   Covid-19, Mrna,Vaccine(Spikevax)35yrs and older 05/30/2022   Hepatitis B 01/24/2002   Influenza Inj Mdck Quad Pf 05/30/2022   Influenza,inj,Quad PF,6+ Mos 04/12/2016, 04/28/2020, 05/04/2021   Influenza-Unspecified 04/10/2015   Moderna Covid-19 Vaccine Bivalent Booster 63yrs & up 05/19/2021   Moderna Sars-Covid-2 Vaccination 09/17/2019, 10/17/2019, 05/28/2020   Pneumococcal Polysaccharide-23 06/09/2014   Td 04/23/2018   Tdap 02/03/2008   Typhoid Inactivated 01/24/2002   Zoster Recombinant(Shingrix) 04/28/2020

## 2023-03-29 DIAGNOSIS — Z23 Encounter for immunization: Secondary | ICD-10-CM | POA: Diagnosis not present

## 2023-04-04 ENCOUNTER — Other Ambulatory Visit: Payer: Self-pay

## 2023-04-04 DIAGNOSIS — J849 Interstitial pulmonary disease, unspecified: Secondary | ICD-10-CM

## 2023-04-04 DIAGNOSIS — R053 Chronic cough: Secondary | ICD-10-CM

## 2023-04-04 DIAGNOSIS — Z2989 Encounter for other specified prophylactic measures: Secondary | ICD-10-CM

## 2023-04-05 ENCOUNTER — Ambulatory Visit: Payer: BC Managed Care – PPO | Admitting: Internal Medicine

## 2023-04-05 DIAGNOSIS — R053 Chronic cough: Secondary | ICD-10-CM

## 2023-04-05 DIAGNOSIS — J849 Interstitial pulmonary disease, unspecified: Secondary | ICD-10-CM

## 2023-04-05 LAB — PULMONARY FUNCTION TEST
DL/VA % pred: 97 %
DL/VA: 4.18 ml/min/mmHg/L
DLCO cor % pred: 72 %
DLCO cor: 20.33 ml/min/mmHg
DLCO unc % pred: 72 %
DLCO unc: 20.33 ml/min/mmHg
FEF 25-75 Pre: 3.17 L/sec
FEF2575-%Pred-Pre: 103 %
FEV1-%Pred-Pre: 84 %
FEV1-Pre: 3.11 L
FEV1FVC-%Pred-Pre: 107 %
FEV6-%Pred-Pre: 82 %
FEV6-Pre: 3.82 L
FEV6FVC-%Pred-Pre: 104 %
FVC-%Pred-Pre: 79 %
FVC-Pre: 3.82 L
Pre FEV1/FVC ratio: 81 %
Pre FEV6/FVC Ratio: 100 %

## 2023-04-05 NOTE — Patient Instructions (Signed)
Spiro/DLCO performed today. 

## 2023-04-05 NOTE — Progress Notes (Signed)
Spiro/DLCO performed today. 

## 2023-04-06 ENCOUNTER — Encounter: Payer: BC Managed Care – PPO | Admitting: Rheumatology

## 2023-04-09 ENCOUNTER — Encounter: Payer: Self-pay | Admitting: Internal Medicine

## 2023-04-09 ENCOUNTER — Ambulatory Visit: Payer: BC Managed Care – PPO | Admitting: Internal Medicine

## 2023-04-09 VITALS — BP 124/82 | HR 90 | Ht 70.0 in | Wt 199.2 lb

## 2023-04-09 DIAGNOSIS — Z23 Encounter for immunization: Secondary | ICD-10-CM

## 2023-04-09 DIAGNOSIS — M3502 Sicca syndrome with lung involvement: Secondary | ICD-10-CM

## 2023-04-09 DIAGNOSIS — Z79899 Other long term (current) drug therapy: Secondary | ICD-10-CM

## 2023-04-09 DIAGNOSIS — Z5181 Encounter for therapeutic drug level monitoring: Secondary | ICD-10-CM | POA: Diagnosis not present

## 2023-04-09 DIAGNOSIS — J8489 Other specified interstitial pulmonary diseases: Secondary | ICD-10-CM | POA: Diagnosis not present

## 2023-04-09 DIAGNOSIS — H0259 Other disorders affecting eyelid function: Secondary | ICD-10-CM

## 2023-04-09 DIAGNOSIS — M359 Systemic involvement of connective tissue, unspecified: Secondary | ICD-10-CM

## 2023-04-09 DIAGNOSIS — Z2989 Encounter for other specified prophylactic measures: Secondary | ICD-10-CM

## 2023-04-09 NOTE — Progress Notes (Signed)
OV 12/19/2022 -transfer of care to see Dr. Marchelle Gearing in the interstitial lung disease center.  History is gained from talking to the patient review of the records and also the ILD questionnaire.  Subjective:  Patient ID: Joseph Hendricks, male , DOB: October 09, 1965 , age 57 y.o. , MRN: [id number] , ADDRESS: [address] [place] [address] PCP Bosie Clos, MD Patient Care Team: Bosie Clos, MD as PCP - General (Family Medicine)  This Provider for this visit: Treatment Team:  Attending Provider: Kalman Shan, MD    12/19/2022 -   Chief Complaint  Patient presents with   Consult    Consult for possible ILD, CT scan, and bronch results.     HPI MARKE GOODWYN 57 y.o. 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  He is related to Dr. Andrey Spearman the surgeon [his brother-in-law].  He tells me that he has been previously well but in January 2024 all of a sudden he started with a cough that kept insidiously.  He is had 2 rounds of antibiotics and prednisone initially diagnosed also with pneumonia.  He felt he was really bad at the time and could not work.  The cough would get worse with sneezing and yawning.  He thought he might of had COVID at this time in January but no COVID test has been positive.  He subsequently saw Dr. Levy Pupa who controlled his cough with the help of acid reflux treatment.  He also subjective patient other treatment measures.  The cough gradually got away.  He has been stable to walk right now but he still gets fatigued and short of breath.  He says when he goes for his 2 mile walk 4 times a week he has to pause at the top.  This is not his baseline although it is better compared to earlier in the year when he was coughing significantly.  He says through this workup with Dr. Delton Coombes interstitial lung disease was diagnosed.  Then subsequently in April 2024 underwent bronchoscopy with lavage that shows 55%  lymphocytes.  Which means BAL with lymphocytosis.  His autoimmune antibody panel is positive for SSA SSB and related myositis antibody.  His ANA is also trace positive.  At 1: 80 showing cytoplasmic pattern.  Repeat ANA on the same day shows 1: 640 with a nuclear speckled pattern.  His hypersensitive pneumonitis antibody panel is normal.  The CT scan he underwent for evaluation of the cough is a super D CT scan.  I personally visualized it.  I agree it is not consistent with UIP.  I personally thought it look like NSIP.  However Dr. Dorothey Baseman feels that is consistent with organizing pneumonia but we all agree that alternative pattern.  He is awaiting rheumatology consultation but is not until October 2024.  Secure chatted DR Corliss Skains who upon review of the chart feels patient's features are all consistent with Sjogren's syndrome.  [He does have a history of dry mouth].  Detail ILD question as below    [facility] Comprehensive ILD Questionnaire  Symptoms:    Past Medical History :  -He has history of exercise-induced asthma for a few decades - Prior connective tissue diagnosis diagnosis is negative.  Although he is aware of his recent antibody panel positivity. - He has had COVID-vaccine but not formally the COVID disease although at the onset of the illness he thought he may have had COVID  ROS:  -Positive for fatigue for the last several months - Positive arthralgia for the last few years particularly in the fingers - She does have an occasional heartburn [previously had significant heartburn and he lost 25 pounds and his heartburn resolved].  He does occasionally snore -He does have dry mouth for years.  Is particularly worse in the morning.  But no dry hands  FAMILY HISTORY of LUNG DISEASE:  -Asthma in her brother but otherwise negative for family history of lung disease  PERSONAL EXPOSURE HISTORY:  -Smoked marijuana few x 35 to 40 years ago but otherwise no current marijuana  use no tobacco use no no cocaine use no intravenous drug use  HOME  EXPOSURE and HOBBY DETAILS :  -[place]  Home was built in 1989.  He has lived there past 21 years.  Detail organic antigen exposure history in the house is negative.  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  All corporate finance related.  Detail organic and inorganic antigen exposure history at work or else was negative.  PULMONARY TOXICITY HISTORY (27 items):  -Other than short course of prednisone he has not had any drugs that can cause pulmonary toxicity.  INVESTIGATIONS: As below    SUPER D CT 10/18/22 - personally visualzied, interpreted but agree with findings below -per email communication with Dr. Dorothey Baseman for second opinionm " This looks like organizing PNA to me.  Sjgren syndrome can present with an OP pattern.  Could also be due to some other insult like a drug reaction.    arrative & Impression  CLINICAL DATA:  Lung nodule. History of pneumonia with abnormal pulmonary function tests.   EXAM: CT CHEST WITHOUT CONTRAST   TECHNIQUE: Multidetector CT imaging of the chest was performed using thin slice collimation for electromagnetic bronchoscopy planning purposes, without intravenous contrast.   RADIATION DOSE REDUCTION: This exam was performed according to the departmental dose-optimization program which includes automated exposure control, adjustment of the mA and/or kV according to patient size and/or use of iterative reconstruction technique.   COMPARISON:  None Available.   FINDINGS: Cardiovascular: 07/31/2012.   Mediastinum/Nodes: Atherosclerotic calcification of the aorta. Ascending aorta measures 4.3 cm (5/51). Comparison with 07/31/2012 is challenging in the absence of coronal reformatted images. Heart is enlarged. No pericardial effusion.   Lungs/Pleura: No pathologically enlarged mediastinal or  axillary lymph nodes. Hilar regions are difficult to definitively evaluate without IV contrast. Esophagus is grossly unremarkable.   Upper Abdomen: Patchy peripheral and basilar coarsened ground-glass and slight consolidation with traction bronchiectasis, new from 07/31/2012. No pleural fluid. Airway is unremarkable.   Musculoskeletal: Visualized portions of the liver, gallbladder adrenal glands are unremarkable. Low-attenuation lesion in the right kidney. No specific follow-up necessary. Visualized portions of the kidneys, spleen, pancreas, stomach and bowel are otherwise grossly unremarkable. No upper abdominal adenopathy.   IMPRESSION: No worrisome lytic or sclerotic lesions.   1. Pulmonary parenchymal pattern of peripheral and basilar coarsened ground-glass, slight consolidation and traction bronchiectasis, new from 07/31/2012 and likely due to the sequelae of COVID-19 pneumonia. Findings are suggestive of an alternative diagnosis (not UIP) per consensus guidelines: Diagnosis of Idiopathic Pulmonary Fibrosis: An Official ATS/ERS/JRS/ALAT Clinical Practice Guideline. Am Rosezetta Schlatter Crit Care Med Vol 198, Iss 5, [id number], Mar 10 2017. 2. 4.3 cm ascending aortic aneurysm. Recommend annual imaging followup by CTA or MRA. This recommendation follows 2010 ACCF/AHA/AATS/ACR/ASA/SCA/SCAI/SIR/STS/SVM Guidelines for the Diagnosis and Management of Patients with Thoracic  Aortic Disease. Circulation. 2010; 121: W119-J478. Aortic aneurysm NOS (ICD10-I71.9). 3.  Aortic atherosclerosis (ICD10-I70.0).     Electronically Signed   By: Leanna Battles M.D.   On: 10/19/2022 13:41     Latest Reference Range & Units 10/27/22 11:31  Anti-Jo-1 Ab (RDL) <20 Units <20  Anti-PL-7 Ab (RDL) Negative  Negative  Anti-PL-12 Ab (RDL) Negative  Negative  Anti-EJ Ab (RDL) Negative  Negative  Anti-OJ Ab (RDL) Negative  Negative  Anti-SRP Ab (RDL) Negative  Negative  Anti-Mi-2 Ab (RDL) Negative  Negative   GNFA-OZH-0QMVHQ Ab (RDL) <20 Units <20  Anti-MDA-5 Ab (CADM-140)(RDL) <20 Units <20  Anti-NXP-2 (P140) Ab (RDL) <20 Units <20  Anti-SAE1 Ab, IgG (RDL) <20 Units <20  Anti-PM/Scl-100 Ab (RDL) <20 Units <20  Anti-Ku Ab (RDL) Negative  Weak Positive !  Anti-SS-A 52kD Ab, IgG (RDL) <20 Units >200 (H)  Anti-U1 RNP Ab (RDL) <20 Units <20  Anti-U2 RNP Ab (RDL) Negative  Negative  Anti-U3 RNP (Fibrillarin)(RDL) Negative  Negative  !: Data is abnormal (H): Data is abnormally high    Latest Reference Range & Units 05/06/21 07:28 10/27/22 11:30  ENA RNP Ab 0.0 - 0.9 AI  <0.2  SSA (Ro) (ENA) Antibody, IgG <1.0 NEG AI  >8.0 POS !  SSB (La) (ENA) Antibody, IgG <1.0 NEG AI  1.2 POS !  Scleroderma (Scl-70) (ENA) Antibody, IgG <1.0 NEG AI  <1.0 NEG  Prostate Specific Ag, Serum 0.0 - 4.0 ng/mL 0.4   !: Data is abnormal    Latest Reference Range & Units 10/27/22 11:30  Anti Nuclear Antibody (ANA) NEGATIVE  POSITIVE !  ANA Pattern 1  Cytoplasmic !  ANA Titer 1 titer 1:80 (H)  ANCA SCREEN Negative  Negative  Cyclic Citrullin Peptide Ab UNITS <16  ds DNA Ab IU/mL 2  ENA RNP Ab 0.0 - 0.9 AI <0.2  RA Latex Turbid. <14 IU/mL 12  !: Data is abnormal (H): Data is abnormally high    Latest Reference Range & Units 06/10/14 00:00 05/14/15 07:44 04/14/16 07:45 06/13/17 07:54 04/29/18 07:57 04/29/19 08:14 04/29/20 07:22 05/06/21 07:28 11/06/22 05:47  Hemoglobin 13.0 - 17.0 g/dL 46.9 CANCELED 62.9 52.8 14.8 14.4 14.7 14.8 14.4     Latest Reference Range & Units 10/27/22 11:30  A.Fumigatus #1 Abs Negative  Negative  Micropolyspora faeni, IgG Negative  Negative  Thermoactinomyces vulgaris, IgG Negative  Negative  A. Pullulans Abs Negative  Negative  Thermoact. Saccharii Negative  Negative  Pigeon Serum Abs Negative  Negative     Latest Reference Range & Units 12/19/22 00:00  ENA SSA (RO) Ab 0.0 - 0.9 AI >8.0 (H)  ENA SSB (LA) Ab 0.0 - 0.9 AI 2.2 (H)  (H): Data is abnormally high   12/19/22 00:00   Speckled Pattern 1:1280 (H)  (H): Data is abnormally high  OV 01/04/2023  Subjective:  Patient ID: Joseph Hendricks, male , DOB: January 15, 1966 , age 21 y.o. , MRN: [id number] , ADDRESS: [address] [place] [address] PCP Bosie Clos, MD Patient Care Team: Bosie Clos, MD as PCP - General (Family Medicine)  This Provider for this visit: Treatment Team:  Attending Provider: Kalman Shan, MD  Type of visit: Video Virtual Visit Identification of patient KYRIAKOS BABLER with [birth year] and MRN [id number] - 2 person identifier Risks: Risks, benefits, limitations of telephone visit explained. Patient understood and verbalized agreement to proceed Anyone else on call: Just him Patient location: Appears to be in his office This  provider location: [address]; [place]; [place] [address]. [facility]. [phone]    01/04/2023 -   Chief Complaint  Patient presents with   Follow-up    F/up on ILD, confused about which Dr will be taking over ILD care.     HPI BJORN HALLAS 57 y.o. -in this video visit ABDULWAHAB DEMELO tells me that he is doing much better after starting prednisone he feels better.  The prednisone is increasing his hunger and giving some insomnia but he is taking melatonin now.  He is taking his Bactrim.  He is also taking his CellCept.  He met with pharmacy.  Reviewed his record he also met with rheumatologist Dr. Corliss Skains yesterday.  He is pleased with both visits.  He did have some labs.  He is has got upcoming safety labs.  He wanted to know who would be his primary physician.  I did explain to him with the collaborative support between rheumatology and myself and pulmonary.  I did indicate to him that I will be following the pulmonary side.  Did indicate to him that patient have clinical pharmacist and many times if the primary problem is interstitial lung disease Dr Shan Levans will make recommendations and allow me to  adjust his medications.  However I did indicate to him he needs continued monitoring from rheumatology over time because of extrapulmonary manifestations associated with Sjogren's.  He verbalized understanding.   There are no other new issues.  This visit is video check-in    OV 04/09/2023  Subjective:  Patient ID: MADDIX HEINZ, male , DOB: 09-29-1965 , age 15 y.o. , MRN: [id number] , ADDRESS: [address] [place] [address] PCP Bosie Clos, MD Patient Care Team: Bosie Clos, MD as PCP - General (Family Medicine)  This Provider for this visit: Treatment Team:  Attending Provider: Kalman Shan, MD  Sjogren's with interstitial lung disease  Treatment program starting 12/24/2022-      Increase prednisone to 60mg  PO daily x 2 weeks( originally started on 40 mg/day on 12/19/2022], then 50mg  daily x 2 weeks, then 40mg  daily x 2 weeks, then 30 mg daily x 2 weeks and then 20mg  daily x 2 weeks and then 10mg  daily to continue Start Bactrim DS 1 tablet PO Mondays, Wednesdays, Fridays for PJP prophylaxis. Start Cellcept 500mg  PO twice daily x 2 weeks. Repeat CBC and CMP. If stable, increase to 1000mg  twice daily.  04/09/2023 -   Chief Complaint  Patient presents with   Consult   Follow-up    ILD      HPI EDNA GROVER 57 y.o. -returns for follow-up.  #Interstitial lung disease due to Sjogren: He is now taking CellCept at full dose 1000 mg twice daily.  He is finished his prednisone 2 to 3 weeks ago.  Is having some more shortness of breath and fatigue but shortness of breath scale is the same fatigue scale might be slightly worse.  He feels because of prednisone withdrawal but his pulmonary function test actually shows improvement and this was reassuring.  His sit/stand hypoxemia test was also normal.  #Steroid withdrawal: Having slightly more fatigue and shortness of breath without prednisone but we took a shared decision making to monitor because pulmonary  function test was improved  #New problem of bilateral eyelid swelling particularly in the lower eyelids been going on for 2 or 3 weeks after he stopped prednisone.  I told him that I suspect  this because with Sjogren's he is making tears in his tear ducts clogged.  He does not use bluelight for his eyeglasses.  I told him to get bluelight.  Also advised him to wear warm compress.  #Other symptoms: He does have some dizziness when he bends forward and he has shortness of breath but otherwise is able to walk 2 miles 4 times a week and just gets mild shortness of breath with uphill.  He is still able to keep up with his wife and the walk exercise..  Did indicate to him to get a pulse ox monitor and monitor.  The dizziness comes with crunching and it is expected in ILD patients.  Told him to lose some weight and take care.  #Vaccination: He will have Prevnar today.  We discussed RSV vaccine.  He is below 60 but is immunosuppressed.  Overall risk profile suggest that he should take the RSV vaccine.  He will pay out-of-pocket for this.   SYMPTOM SCALE - ILD 12/19/2022 04/09/2023 CellCept 1000 mg twice daily and of chronic prednisone for the last 10 days.  Current weight    O2 use ra Room air  Shortness of Breath 0 -> 5 scale with 5 being worst (score 6 If unable to do)   At rest 0 0  Simple tasks - showers, clothes change, eating, shaving 0 0  Household (dishes, doing bed, laundry) 1.5 2  Shopping 0 0  Walking level at own pace 0 0  Walking up Stairs 2.5 2  Total (30-36) Dyspnea Score 4 4      Non-dyspnea symptoms (0-> 5 scale) 12/19/2022 04/09/2023   How bad is your cough? 0 0  How bad is your fatigue 1.5 2  How bad is nausea 0 0  How bad is vomiting?  0 0  How bad is diarrhea? 0 0  How bad is anxiety? 1.5 1  How bad is depression 0 0  Any chronic pain - if so where and how bad no      Simple office walk 224 (66+46 x 2) feet Pod A at [facility] x  3 laps goal with forehead probe  12/19/2022  04/09/2023   O2 used ra ra  Number laps completed Sit stand x 15 Sit/stand x 10  Comments about pace good   Resting Pulse Ox/HR 96% and 73/min 95%/90/min  Final Pulse Ox/HR 97% and 104/min 97%/111  Desaturated </= 88% no   Desaturated <= 3% points no   Got Tachycardic >/= 90/min yes   Symptoms at end of test Mildd dypsne   Miscellaneous comments x     PFT     Latest Ref Rng & Units 04/04/2023   10:32 AM 12/05/2022   10:59 AM  ILD indicators  FVC-Pre L 3.82  3.41   FVC-Predicted Pre % 79  70   FVC-Post L  3.44   FVC-Predicted Post %  71   TLC L  5.24   TLC Predicted %  74   DLCO uncorrected ml/min/mmHg 20.33  19.84   DLCO UNC %Pred % 72  70   DLCO Corrected ml/min/mmHg 20.33  19.95   DLCO COR %Pred % 72  71       LAB RESULTS last 96 hours No results found.  LAB RESULTS last 90 days Recent Results (from the past 2160 hour(s))  Protein Electrophoresis, (serum)     Status: None   Collection Time: 01/10/23  7:24 AM  Result Value Ref Range   Albumin  ELP 3.6 2.9 - 4.4 g/dL   Alpha 1 0.2 0.0 - 0.4 g/dL   Alpha 2 0.6 0.4 - 1.0 g/dL   Beta 1.0 0.7 - 1.3 g/dL   Gamma Globulin 1.1 0.4 - 1.8 g/dL   M-Spike, % Not Observed Not Observed g/dL   Globulin, Total 2.9 2.2 - 3.9 g/dL   A/G Ratio 1.2 0.7 - 1.7   Please Note: Comment     Comment: Protein electrophoresis scan will follow via computer, mail, or courier delivery.    Interpretation: Comment     Comment: The SPE pattern appears unremarkable. Evidence of monoclonal protein is not apparent.   CMP14+EGFR     Status: Abnormal   Collection Time: 01/10/23  7:24 AM  Result Value Ref Range   Glucose 87 70 - 99 mg/dL   BUN 21 6 - 24 mg/dL   Creatinine, Ser 1.61 0.76 - 1.27 mg/dL   eGFR 81 >09 UE/AVW/0.98   BUN/Creatinine Ratio 20 9 - 20   Sodium 140 134 - 144 mmol/L   Potassium 4.5 3.5 - 5.2 mmol/L   Chloride 101 96 - 106 mmol/L   CO2 24 20 - 29 mmol/L   Calcium 9.2 8.7 - 10.2 mg/dL   Total Protein 6.5 6.0  - 8.5 g/dL   Albumin 4.2 3.8 - 4.9 g/dL   Globulin, Total 2.3 1.5 - 4.5 g/dL   Bilirubin Total 0.9 0.0 - 1.2 mg/dL   Alkaline Phosphatase 34 (L) 44 - 121 IU/L   AST 15 0 - 40 IU/L   ALT 21 0 - 44 IU/L  CBC with Differential/Platelet     Status: None   Collection Time: 01/10/23  7:24 AM  Result Value Ref Range   WBC 7.6 3.4 - 10.8 x10E3/uL   RBC 4.46 4.14 - 5.80 x10E6/uL   Hemoglobin 14.6 13.0 - 17.7 g/dL   Hematocrit 11.9 14.7 - 51.0 %   MCV 97 79 - 97 fL   MCH 32.7 26.6 - 33.0 pg   MCHC 33.7 31.5 - 35.7 g/dL   RDW 82.9 56.2 - 13.0 %   Platelets 256 150 - 450 x10E3/uL   Neutrophils 58 Not Estab. %   Lymphs 33 Not Estab. %   Monocytes 9 Not Estab. %   Eos 0 Not Estab. %   Basos 0 Not Estab. %   Neutrophils Absolute 4.4 1.4 - 7.0 x10E3/uL   Lymphocytes Absolute 2.5 0.7 - 3.1 x10E3/uL   Monocytes Absolute 0.7 0.1 - 0.9 x10E3/uL   EOS (ABSOLUTE) 0.0 0.0 - 0.4 x10E3/uL   Basophils Absolute 0.0 0.0 - 0.2 x10E3/uL   Immature Granulocytes 0 Not Estab. %   Immature Grans (Abs) 0.0 0.0 - 0.1 x10E3/uL  Beta-2-glycoprotein i abs, IgG/M/A     Status: None   Collection Time: 01/10/23  7:24 AM  Result Value Ref Range   Beta-2 Glyco I IgG <9 0 - 20 GPI IgG units    Comment: The reference interval reflects a 3SD or 99th percentile interval, which is thought to represent a potentially clinically significant result in accordance with the International Consensus Statement on the classification criteria for definitive antiphospholipid syndrome (APS). J Thromb Haem 2006;4:295-306.    Beta-2 Glyco 1 IgA <9 0 - 25 GPI IgA units    Comment: The reference interval reflects a 3SD or 99th percentile interval, which is thought to represent a potentially clinically significant result in accordance with the International Consensus Statement on the classification criteria for definitive antiphospholipid syndrome (  APS). J Thromb Haem 2006;4:295-306.    Beta-2 Glyco 1 IgM <9 0 - 32 GPI IgM units     Comment: The reference interval reflects a 3SD or 99th percentile interval, which is thought to represent a potentially clinically significant result in accordance with the International Consensus Statement on the classification criteria for definitive antiphospholipid syndrome (APS). J Thromb Haem 2006;4:295-306.   Cardiolipin antibodies, IgG, IgM, IgA     Status: None   Collection Time: 01/10/23  7:24 AM  Result Value Ref Range   Anticardiolipin IgG <9 0 - 14 GPL U/mL    Comment:                           Negative:              <15                           Indeterminate:     15 - 20                           Low-Med Positive: >20 - 80                           High Positive:         >80    Anticardiolipin IgM <9 0 - 12 MPL U/mL    Comment:                           Negative:              <13                           Indeterminate:     13 - 20                           Low-Med Positive: >20 - 80                           High Positive:         >80    Anticardiolipin IgA <9 0 - 11 APL U/mL    Comment:                           Negative:              <12                           Indeterminate:     12 - 20                           Low-Med Positive: >20 - 80                           High Positive:         >80   C3 and C4     Status: None   Collection Time: 01/10/23  7:24 AM  Result Value Ref Range   Complement C3, Serum 132 82 - 167 mg/dL   Complement  C4, Serum 15 12 - 38 mg/dL  CK     Status: Abnormal   Collection Time: 01/10/23  7:24 AM  Result Value Ref Range   Total CK 23 (L) 41 - 331 U/L  Anti-Smith antibody     Status: None   Collection Time: 01/10/23  7:24 AM  Result Value Ref Range   ENA SM Ab Ser-aCnc <0.2 0.0 - 0.9 AI  CBC with Differential/Platelet     Status: Abnormal   Collection Time: 02/01/23 10:21 AM  Result Value Ref Range   WBC 8.7 3.8 - 10.8 Thousand/uL   RBC 4.65 4.20 - 5.80 Million/uL   Hemoglobin 15.1 13.2 - 17.1 g/dL   HCT 16.1 09.6 - 04.5 %    MCV 93.3 80.0 - 100.0 fL   MCH 32.5 27.0 - 33.0 pg   MCHC 34.8 32.0 - 36.0 g/dL   RDW 40.9 81.1 - 91.4 %   Platelets 238 140 - 400 Thousand/uL   MPV 10.1 7.5 - 12.5 fL   Neutro Abs 7,752 1,500 - 7,800 cells/uL   Lymphs Abs 653 (L) 850 - 3,900 cells/uL   Absolute Monocytes 287 200 - 950 cells/uL   Eosinophils Absolute 0 (L) 15 - 500 cells/uL   Basophils Absolute 9 0 - 200 cells/uL   Neutrophils Relative % 89.1 %   Total Lymphocyte 7.5 %   Monocytes Relative 3.3 %   Eosinophils Relative 0.0 %   Basophils Relative 0.1 %  COMPLETE METABOLIC PANEL WITH GFR     Status: Abnormal   Collection Time: 02/01/23 10:21 AM  Result Value Ref Range   Glucose, Bld 113 (H) 65 - 99 mg/dL    Comment: .            Fasting reference interval . For someone without known diabetes, a glucose value between 100 and 125 mg/dL is consistent with prediabetes and should be confirmed with a follow-up test. .    BUN 22 7 - 25 mg/dL   Creat 7.82 9.56 - 2.13 mg/dL   eGFR 90 > OR = 60 YQ/MVH/8.46N6   BUN/Creatinine Ratio SEE NOTE: 6 - 22 (calc)    Comment:    Not Reported: BUN and Creatinine are within    reference range. .    Sodium 138 135 - 146 mmol/L   Potassium 4.6 3.5 - 5.3 mmol/L   Chloride 104 98 - 110 mmol/L   CO2 25 20 - 32 mmol/L   Calcium 9.6 8.6 - 10.3 mg/dL   Total Protein 6.8 6.1 - 8.1 g/dL   Albumin 4.3 3.6 - 5.1 g/dL   Globulin 2.5 1.9 - 3.7 g/dL (calc)   AG Ratio 1.7 1.0 - 2.5 (calc)   Total Bilirubin 1.0 0.2 - 1.2 mg/dL   Alkaline phosphatase (APISO) 28 (L) 35 - 144 U/L   AST 13 10 - 35 U/L   ALT 21 9 - 46 U/L  Pulmonary function test     Status: None   Collection Time: 04/04/23 10:32 AM  Result Value Ref Range   FVC-Pre 3.82 L   FVC-%Pred-Pre 79 %   FEV1-Pre 3.11 L   FEV1-%Pred-Pre 84 %   FEV6-Pre 3.82 L   FEV6-%Pred-Pre 82 %   Pre FEV1/FVC ratio 81 %   FEV1FVC-%Pred-Pre 107 %   Pre FEV6/FVC Ratio 100 %   FEV6FVC-%Pred-Pre 104 %   FEF 25-75 Pre 3.17 L/sec    FEF2575-%Pred-Pre 103 %   DLCO unc 20.33 ml/min/mmHg   DLCO unc %  pred 72 %   DLCO cor 20.33 ml/min/mmHg   DLCO cor % pred 72 %   DL/VA 4.09 ml/min/mmHg/L   DL/VA % pred 97 %         has a past medical history of 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   reports that he has never smoked. He has never been exposed to tobacco smoke. He has never used smokeless tobacco.  Past Surgical History:  Procedure Laterality Date   APPENDECTOMY     BRONCHIAL BIOPSY  11/06/2022   Procedure: BRONCHIAL BIOPSIES;  Surgeon: Leslye Peer, MD;  Location: [facility] ENDOSCOPY;  Service: Pulmonary;;   BRONCHIAL WASHINGS  11/06/2022   Procedure: BRONCHIAL WASHINGS;  Surgeon: Leslye Peer, MD;  Location: [facility] ENDOSCOPY;  Service: Pulmonary;;   COLONOSCOPY     HERNIA REPAIR     KNEE SURGERY Left    SHOULDER SURGERY Right    SQUAMOUS CELL CARCINOMA EXCISION     x3 or 4 per patient, bilateral arms, back and leg   VIDEO BRONCHOSCOPY Bilateral 11/06/2022   Procedure: VIDEO BRONCHOSCOPY WITH FLUORO;  Surgeon: Leslye Peer, MD;  Location: [facility] ENDOSCOPY;  Service: Pulmonary;  Laterality: Bilateral;   WISDOM TOOTH EXTRACTION      No Known Allergies  Immunization History  Administered Date(s) Administered   Hepatitis B 01/24/2002   Influenza Inj Mdck Quad Pf 05/30/2022   Influenza,inj,Quad PF,6+ Mos 04/12/2016, 04/28/2020, 05/04/2021   Influenza-Unspecified 04/10/2015, 03/29/2023   Moderna Covid-19 Fall Seasonal Vaccine 42yrs & older 05/30/2022   Moderna Covid-19 Vaccine Bivalent Booster 63yrs & up 05/19/2021   Moderna Sars-Covid-2 Vaccination 09/17/2019, 10/17/2019, 05/28/2020   PNEUMOCOCCAL CONJUGATE-20 04/09/2023   Pfizer Covid-19  Vaccine Bivalent Booster 42yrs & up 03/29/2023   Pneumococcal Polysaccharide-23 06/09/2014   Td 04/23/2018   Tdap 02/03/2008   Typhoid Inactivated 01/24/2002   Zoster Recombinant(Shingrix) 04/28/2020    Family History  Problem Relation Age of Onset   Arthritis Mother        osteo   Factor V Leiden deficiency Mother    Diabetes Father    Alzheimer's disease Father    Healthy Sister    Allergies Brother    Cancer Brother        testicular   Healthy Brother    Healthy Son    Healthy Daughter    Healthy Daughter      Current Outpatient Medications:    azelastine (ASTELIN) 0.1 % nasal spray, Place 2 sprays into both nostrils in the morning. Use in each nostril as directed (Patient taking differently: Place 2 sprays into both nostrils as needed. Use in each nostril as directed), Disp: , Rfl:    Cholecalciferol (VITAMIN D3 PO), Take 1 tablet by mouth at bedtime., Disp: , Rfl:    fluticasone (FLONASE) 50 MCG/ACT nasal spray, Place 2 sprays into both nostrils in the morning and at bedtime. (Patient taking differently: Place 2 sprays into both nostrils as needed.), Disp: , Rfl:    lovastatin (MEVACOR) 20 MG tablet, TAKE 1 TABLET BY MOUTH AT BEDTIME, Disp: 90 tablet, Rfl: 3   Multiple Vitamins-Minerals (MENS MULTIVITAMIN PLUS) TABS, Take 1 tablet by mouth at bedtime., Disp: , Rfl:    mycophenolate (CELLCEPT) 500 MG tablet, Take 2 tablets (1,000 mg total) by mouth 2 (two) times daily., Disp: 360 tablet, Rfl: 0  sulfamethoxazole-trimethoprim (BACTRIM DS) 800-160 MG tablet, Take one tablet by mouth on Mondays, Wednesdays, and Fridays only., Disp: 36 tablet, Rfl: 2   predniSONE (DELTASONE) 10 MG tablet, Take 5 tablets (50 mg total) by mouth daily with breakfast for 8 days, THEN 4 tablets (40 mg total) daily with breakfast for 14 days, THEN 3 tablets (30 mg total) daily with breakfast for 14 days, THEN 2 tablets (20 mg total) daily with breakfast for 14 days, THEN 1 tablet (10 mg total) daily with  breakfast for 14 days., Disp: 180 tablet, Rfl: 0      Objective:   Vitals:   04/09/23 1257  BP: 124/82  Pulse: 90  SpO2: 95%  Weight: 199 lb 3.2 oz (90.4 kg)  Height: 5\' 10"  (1.778 m)    Estimated body mass index is 28.58 kg/m as calculated from the following:   Height as of this encounter: 5\' 10"  (1.778 m).   Weight as of this encounter: 199 lb 3.2 oz (90.4 kg).  @WEIGHTCHANGE @  [employer]   04/09/23 1257  Weight: 199 lb 3.2 oz (90.4 kg)     Physical Exam   General: No distress. Looks well O2 at rest: no Cane present: no Sitting in wheel chair: no Frail: no Obese: no Neuro: Alert and Oriented x 3. GCS 15. Speech normal Psych: Pleasant Resp:  Barrel Chest - no.  Wheeze - no, Crackles - no, No overt respiratory distress CVS: Normal heart sounds. Murmurs - no Ext: Stigmata of Connective Tissue Disease - no HEENT: Normal upper airway. PEERL +. No post nasal drip        Assessment:       ICD-10-CM   1. Interstitial lung disease due to connective tissue disease (HCC)  J84.89 CBC w/Diff   M35.9 Comp Met (CMET)    Hepatic function panel    Hepatic function panel    Comp Met (CMET)    CBC w/Diff    2. Sjogren syndrome with lung involvement (HCC)  M35.02 CBC w/Diff    Comp Met (CMET)    Hepatic function panel    Hepatic function panel    Comp Met (CMET)    CBC w/Diff    3. High risk medication use  Z79.899 CBC w/Diff    Comp Met (CMET)    Hepatic function panel    Hepatic function panel    Comp Met (CMET)    CBC w/Diff    4. Medication monitoring encounter  Z51.81     5. Need for pneumocystis prophylaxis  Z29.89     6. Pneumococcal vaccination administered at current visit  Z23 Pneumococcal conjugate vaccine 20-valent    7. Superficial swelling of eyelid  H02.59 CBC w/Diff    Comp Met (CMET)    Hepatic function panel    Hepatic function panel    Comp Met (CMET)    CBC w/Diff         Plan:     Patient Instructions     ICD-10-CM   1.  Interstitial lung disease due to connective tissue disease (HCC)  J84.89    M35.9     2. Sjogren syndrome with lung involvement (HCC)  M35.02     3. High risk medication use  Z79.899     4. Medication monitoring encounter  Z51.81     5. Need for pneumocystis prophylaxis  Z29.89     6. Pneumococcal vaccination administered at current visit  Z23         ILD (  interstitial lung disease) (HCC) due to SSJoogrens (ANA positive, Dry mouth, Lymphocytosis - bAL April 2024)  -Clinically stable.  Pulmonary function test is actually improved.  Residual shortness of breath and fatigue is probably because of prednisone withdrawal.  At this point in time I do not see a need for continued prednisone.  Plan  -Continue Bactrim DS 1 tablet PO Mondays, Wednesdays, Fridays for PJP prophylaxis. -Continue CellCept 1000 mg twice daily -Check CBC, chemistry, liver function test today  - Continue daily aerobic exercise but can include gentle weight training -Do spirometry and DLCO in 3 months  Vaccine counseling    Plan  - Prevnar vaccine today -RSV vaccine on your own commercially [insurance will pay only after you have 60 years] but they do recommended to be on the safe side given immunosuppression.  Bilateral eyelid swelling-new problem  -Most likely because he making tears with improved Sjogren disease control  Plan - Use warm compress and let us know if this helps  Follow-up - 103-month ; 30-minute visit but after spirometry and DLCO.   FOLLOWUP Return in about 3 months (around 07/09/2023) for 30 min visit, after Cleda Daub and DLCO, ILD, with Dr Marchelle Gearing.    SIGNATURE    Dr. Kalman Shan, M.D., F.C.C.P,  Pulmonary and Critical Care Medicine Staff Physician, [facility] Director - Interstitial Lung Disease  Program  Pulmonary Fibrosis [facility] at [facility] [place], [place], [address]  Pager: [phone], If no answer or between  15:00h -  7:00h: call [phone] Telephone: [phone]  1:31 PM 04/09/2023

## 2023-04-09 NOTE — Patient Instructions (Addendum)
ICD-10-CM   1. Interstitial lung disease due to connective tissue disease (HCC)  J84.89    M35.9     2. Sjogren syndrome with lung involvement (HCC)  M35.02     3. High risk medication use  Z79.899     4. Medication monitoring encounter  Z51.81     5. Need for pneumocystis prophylaxis  Z29.89     6. Pneumococcal vaccination administered at current visit  Z23         ILD (interstitial lung disease) (HCC) due to SSJoogrens (ANA positive, Dry mouth, Lymphocytosis - bAL April 2024)  -Clinically stable.  Pulmonary function test is actually improved.  Residual shortness of breath and fatigue is probably because of prednisone withdrawal.  At this point in time I do not see a need for continued prednisone.  Plan  -Continue Bactrim DS 1 tablet PO Mondays, Wednesdays, Fridays for PJP prophylaxis. -Continue CellCept 1000 mg twice daily -Check CBC, chemistry, liver function test today  - Continue daily aerobic exercise but can include gentle weight training -Do spirometry and DLCO in 3 months  Vaccine counseling    Plan  - Prevnar vaccine today -RSV vaccine on your own commercially [insurance will pay only after you have 60 years] but they do recommended to be on the safe side given immunosuppression.  Bilateral eyelid swelling-new problem  -Most likely because he making tears with improved Sjogren disease control  Plan - Use warm compress and let us know if this helps  Follow-up - 49-month ; 30-minute visit but after spirometry and DLCO.

## 2023-04-11 DIAGNOSIS — M359 Systemic involvement of connective tissue, unspecified: Secondary | ICD-10-CM | POA: Diagnosis not present

## 2023-04-11 DIAGNOSIS — Z79899 Other long term (current) drug therapy: Secondary | ICD-10-CM | POA: Diagnosis not present

## 2023-04-11 DIAGNOSIS — M3502 Sicca syndrome with lung involvement: Secondary | ICD-10-CM | POA: Diagnosis not present

## 2023-04-11 DIAGNOSIS — J8489 Other specified interstitial pulmonary diseases: Secondary | ICD-10-CM | POA: Diagnosis not present

## 2023-04-12 LAB — COMPREHENSIVE METABOLIC PANEL
ALT: 15 [IU]/L (ref 0–44)
AST: 18 [IU]/L (ref 0–40)
Albumin: 4.1 g/dL (ref 3.8–4.9)
Alkaline Phosphatase: 44 [IU]/L (ref 44–121)
BUN/Creatinine Ratio: 10 (ref 9–20)
BUN: 9 mg/dL (ref 6–24)
Bilirubin Total: 0.5 mg/dL (ref 0.0–1.2)
CO2: 19 mmol/L — ABNORMAL LOW (ref 20–29)
Calcium: 9.1 mg/dL (ref 8.7–10.2)
Chloride: 106 mmol/L (ref 96–106)
Creatinine, Ser: 0.87 mg/dL (ref 0.76–1.27)
Globulin, Total: 2.2 g/dL (ref 1.5–4.5)
Glucose: 138 mg/dL — ABNORMAL HIGH (ref 70–99)
Potassium: 4.2 mmol/L (ref 3.5–5.2)
Sodium: 139 mmol/L (ref 134–144)
Total Protein: 6.3 g/dL (ref 6.0–8.5)
eGFR: 100 mL/min/{1.73_m2} (ref >59)

## 2023-04-25 ENCOUNTER — Telehealth: Payer: Self-pay | Admitting: Internal Medicine

## 2023-04-25 DIAGNOSIS — M3502 Sicca syndrome with lung involvement: Secondary | ICD-10-CM

## 2023-04-25 DIAGNOSIS — Z79899 Other long term (current) drug therapy: Secondary | ICD-10-CM

## 2023-04-25 DIAGNOSIS — M359 Systemic involvement of connective tissue, unspecified: Secondary | ICD-10-CM

## 2023-04-25 NOTE — Telephone Encounter (Signed)
Wants Rsv Shot

## 2023-04-26 ENCOUNTER — Other Ambulatory Visit: Payer: Self-pay | Admitting: Internal Medicine

## 2023-04-26 DIAGNOSIS — Z2989 Encounter for other specified prophylactic measures: Secondary | ICD-10-CM

## 2023-04-26 DIAGNOSIS — J849 Interstitial pulmonary disease, unspecified: Secondary | ICD-10-CM

## 2023-04-27 MED ORDER — MYCOPHENOLATE MOFETIL 500 MG PO TABS: 1000.0000 mg | ORAL_TABLET | Freq: Two times a day (BID) | ORAL | 1 refills | Status: DC

## 2023-04-27 NOTE — Telephone Encounter (Signed)
It is now age 59 and above for people with medical risk factors.  Because he is on CellCept I think it is okay to give RSV vaccine right now.  Therefore we can send a prescription on my name.  Patient does understand that he might have to pay out-of-pocket for this and we have discussed this at the last visit.

## 2023-04-27 NOTE — Telephone Encounter (Signed)
Dr. Marchelle Gearing, please advise if you are okay with a prescription for the RSV vaccine being sent to the pharmacy for pt to go ahead and get the vaccine.

## 2023-04-27 NOTE — Telephone Encounter (Signed)
Pt needs prescription for RSV Shot due to pt not being 63 yet

## 2023-04-28 NOTE — Telephone Encounter (Signed)
The order has been placed. This probably will need to be faxed to pt's pharmacy so they will have it.   So this can hopefully be taken care of for pt sooner, routing as high priority.

## 2023-05-03 ENCOUNTER — Ambulatory Visit: Payer: BC Managed Care – PPO | Admitting: Rheumatology

## 2023-06-13 DIAGNOSIS — D2262 Melanocytic nevi of left upper limb, including shoulder: Secondary | ICD-10-CM | POA: Diagnosis not present

## 2023-06-13 DIAGNOSIS — D2261 Melanocytic nevi of right upper limb, including shoulder: Secondary | ICD-10-CM | POA: Diagnosis not present

## 2023-06-13 DIAGNOSIS — X32XXXA Exposure to sunlight, initial encounter: Secondary | ICD-10-CM | POA: Diagnosis not present

## 2023-06-13 DIAGNOSIS — D225 Melanocytic nevi of trunk: Secondary | ICD-10-CM | POA: Diagnosis not present

## 2023-06-13 DIAGNOSIS — D2272 Melanocytic nevi of left lower limb, including hip: Secondary | ICD-10-CM | POA: Diagnosis not present

## 2023-06-13 DIAGNOSIS — L570 Actinic keratosis: Secondary | ICD-10-CM | POA: Diagnosis not present

## 2023-06-25 ENCOUNTER — Telehealth: Payer: Self-pay | Admitting: Internal Medicine

## 2023-06-26 ENCOUNTER — Other Ambulatory Visit (HOSPITAL_BASED_OUTPATIENT_CLINIC_OR_DEPARTMENT_OTHER): Payer: Self-pay

## 2023-06-26 DIAGNOSIS — M359 Systemic involvement of connective tissue, unspecified: Secondary | ICD-10-CM

## 2023-06-27 ENCOUNTER — Ambulatory Visit (HOSPITAL_BASED_OUTPATIENT_CLINIC_OR_DEPARTMENT_OTHER): Payer: BC Managed Care – PPO

## 2023-06-27 DIAGNOSIS — J8489 Other specified interstitial pulmonary diseases: Secondary | ICD-10-CM

## 2023-06-27 DIAGNOSIS — M359 Systemic involvement of connective tissue, unspecified: Secondary | ICD-10-CM

## 2023-06-27 LAB — PULMONARY FUNCTION TEST
DL/VA % pred: 97 %
DL/VA: 4.15 ml/min/mmHg/L
DLCO unc % pred: 77 %
DLCO unc: 21.57 ml/min/mmHg
FEF 25-75 Pre: 3 L/s
FEF2575-%Pred-Pre: 99 %
FEV1-%Pred-Pre: 86 %
FEV1-Pre: 3.14 L
FEV1FVC-%Pred-Pre: 106 %
FEV6-%Pred-Pre: 84 %
FEV6-Pre: 3.88 L
FEV6FVC-%Pred-Pre: 104 %
FVC-%Pred-Pre: 80 %
FVC-Pre: 3.88 L
Pre FEV1/FVC ratio: 81 %
Pre FEV6/FVC Ratio: 100 %

## 2023-06-27 NOTE — Progress Notes (Signed)
Spirometry and DLCO Performed Today.  

## 2023-06-27 NOTE — Patient Instructions (Signed)
Spirometry and DLCO Performed Today.  

## 2023-06-29 MED ORDER — AZELASTINE HCL 0.1 % NA SOLN: 2.0000 | Freq: Every morning | NASAL | 1 refills | Status: AC

## 2023-06-29 NOTE — Telephone Encounter (Signed)
Rx previously ordered by RB  Refilled.

## 2023-07-12 ENCOUNTER — Telehealth: Payer: Self-pay | Admitting: Internal Medicine

## 2023-07-12 NOTE — Telephone Encounter (Signed)
 PT wife calling because ins making them pay for blood work done on 01/12/2024 due to the diag codes. This is what the letter they got states.  Payment was denied by [facility] due to the diag codes not reported by the Dr. .   [facility] is the lab.   CPT codes are  170[address] [phone]  Please call wife to advise her on action taken. [phone]

## 2023-07-21 NOTE — Telephone Encounter (Signed)
 Patient might need to bring the letter by the office for [place] to be able to view the hard copy of. Going to go ahead and route this to Dr. Marchelle Gearing so he can be able to view.

## 2023-07-25 ENCOUNTER — Telehealth: Payer: Self-pay | Admitting: Internal Medicine

## 2023-07-25 DIAGNOSIS — J019 Acute sinusitis, unspecified: Secondary | ICD-10-CM

## 2023-07-25 NOTE — Telephone Encounter (Signed)
 Patient states having symptoms of stuffy nose and head congestion. Pharmacy is [facility] [address] [place]. Patient phone number is [phone].

## 2023-07-26 MED ORDER — AZITHROMYCIN 250 MG PO TABS: 500.0000 mg | ORAL_TABLET | Freq: Once | ORAL | 0 refills | Status: AC

## 2023-07-26 MED ORDER — AZITHROMYCIN 250 MG PO TABS: 250.0000 mg | ORAL_TABLET | Freq: Every day | ORAL | 0 refills | Status: AC

## 2023-07-26 MED ORDER — PREDNISONE 10 MG PO TABS: 40.0000 mg | ORAL_TABLET | Freq: Every day | ORAL | 0 refills | Status: DC

## 2023-07-26 NOTE — Telephone Encounter (Signed)
   1) agree hold cellcept x 5 days. If on backtrim - > hold 2) check at home for covid x 2 iin 24h - > if positive then call back for paxlovid 3) Z pak 4) pred 40mg  daily x 5 day and the back to baseline    Order sent No Known Allergies

## 2023-07-26 NOTE — Telephone Encounter (Signed)
Pt denies symptoms of chest congestion, cough, sob, and any GI symptoms. States he is just having a head cold and stuffy nose. Negative for flu and covid. Stopped taking cellcept due to cold symptoms, ongoing for 4 days. Pt is wanting to know if he should stay off the cellcept ? If he needs to be taking anything. Dr. Marchelle Gearing please advise

## 2023-07-26 NOTE — Telephone Encounter (Signed)
I called and spoke with the pt and notified of response per MR  He verbalized understanding  Nothing further needed

## 2023-07-27 ENCOUNTER — Encounter: Payer: Self-pay | Admitting: Nurse Practitioner

## 2023-07-27 ENCOUNTER — Telehealth: Payer: Self-pay | Admitting: Internal Medicine

## 2023-07-27 DIAGNOSIS — M359 Systemic involvement of connective tissue, unspecified: Secondary | ICD-10-CM

## 2023-07-27 DIAGNOSIS — U071 COVID-19: Secondary | ICD-10-CM

## 2023-07-27 MED ORDER — PAXLOVID (300/100) 20 X 150 MG & 10 X 100MG PO TBPK: 3.0000 | ORAL_TABLET | Freq: Two times a day (BID) | ORAL | 0 refills | Status: AC

## 2023-07-27 MED ORDER — PAXLOVID (300/100) 20 X 150 MG & 10 X 100MG PO TBPK: 3.0000 | ORAL_TABLET | Freq: Two times a day (BID) | ORAL | 0 refills | Status: DC

## 2023-07-27 NOTE — Telephone Encounter (Signed)
Spoke with the pt  He has taken 2 covid test since last night and both were positive  He has been having symptoms since 07/21/23 and is starting to feel better today  Please advise Please note that if you need to send in anything you will need to send since staff gone by 2:20 pm today

## 2023-07-27 NOTE — Telephone Encounter (Signed)
  Ok if he is feeling better then that is fine but paxlovid wont work now [employer] > 5 days since symptom but given fact he is imune suppresed I think probably not a bad idea for him to take paxlovid. He can stop azithromycin. He can hold celcpt and bactrim and Lovastatin during this time       PAXLOVID  - SENT  TO [facility], [place]   Paxlovid (nirmatelvir 300/Ritonavir100) - BID x 5 days - for GFR >= 60  PLEASE CHECK MED LIST for the following issues. Please check the 2 different condition related to concomitant medications in alphabetical order  If [name] with DOB 1966-02-03 is on any of the following Strong CYP3A inhibtors - this patient CLABON LURIA should withold these concomitant meds so they can start paxlovid stratight away. If taking any of these:  alfuzosin, amiodarone, clozapine, colchicine, dihydroergotamine, dronedarone, ergotamine, flecainide, lovastatin, lurasidone, methylergonovine, midazolam [oral], pethidine, pimozide, propafenone, propoxyphene, quinidine, ranolazine, sildenafil simvastatin, triazolam).   If   [name]  with dob 22-Aug-1965 Is on any of these other strong CYP3A inducers then starting paxlovid should be delayed and the following meds should wash out first. These are dapalutamide, carbamazepine, phenobarbital, phenytoin, rifampin, St John's wort) - let me know immediately and we should delay starting paxlovid by some days even if he stops these medication.    PLEASE INFORM Keldan S Spoerl  OF FOLLOWING SIDE EFFECTS  Side effects - all < 5%  - skin rash (and veyr rare a conditon called TEN) - angiomedia  - myalgia - jaundice - high bP (1%) - loss of taste  - diarrhea     Immunization History  Administered Date(s) Administered   Hepatitis B 01/24/2002   Influenza Inj Mdck Quad Pf 05/30/2022   Influenza,inj,Quad PF,6+ Mos 04/12/2016, 04/28/2020, 05/04/2021   Influenza-Unspecified 04/10/2015, 03/29/2023   Moderna Covid-19 Fall  Seasonal Vaccine 34yrs & older 05/30/2022   Moderna Covid-19 Vaccine Bivalent Booster 64yrs & up 05/19/2021   Moderna Sars-Covid-2 Vaccination 09/17/2019, 10/17/2019, 05/28/2020   PNEUMOCOCCAL CONJUGATE-20 04/09/2023   Pfizer Covid-19 Vaccine Bivalent Booster 74yrs & up 03/29/2023   Pneumococcal Polysaccharide-23 06/09/2014   Td 04/23/2018   Tdap 02/03/2008   Typhoid Inactivated 01/24/2002   Zoster Recombinant(Shingrix) 04/28/2020

## 2023-07-27 NOTE — Telephone Encounter (Signed)
Pt notified rx was sent.

## 2023-07-27 NOTE — Telephone Encounter (Signed)
Patient has tested positive for covid. He can be reached at [phone]

## 2023-07-27 NOTE — Progress Notes (Signed)
07/27/23: patient contacted after hours answering service. Was told by his pharmacy that the paxlovid prescription was not received. I did verify in his chart that it was sent by Dr. Marchelle Gearing earlier today. Will resend to his [employer] on file in [place]. instructed by Dr. Marchelle Gearing to hold statin, bactrim and Cellcept

## 2023-08-06 NOTE — Telephone Encounter (Signed)
Called patient to figure out issue. Please call him and check. I have also emailed him

## 2023-08-07 NOTE — Telephone Encounter (Signed)
Spoke with patient wife who will upload a copy of denial to patient's my chart. I informed it will go to Dr. Marchelle Gearing to review and correct . She says the whole claim was denied and bill over $600.

## 2023-08-09 ENCOUNTER — Telehealth: Payer: Self-pay | Admitting: Internal Medicine

## 2023-08-09 DIAGNOSIS — J849 Interstitial pulmonary disease, unspecified: Secondary | ICD-10-CM

## 2023-08-09 DIAGNOSIS — Z2989 Encounter for other specified prophylactic measures: Secondary | ICD-10-CM

## 2023-08-09 NOTE — Telephone Encounter (Signed)
Patient needs refill of CellCept to be sent to [facility] [address] [place]

## 2023-08-10 MED ORDER — MYCOPHENOLATE MOFETIL 500 MG PO TABS: 1000.0000 mg | ORAL_TABLET | Freq: Two times a day (BID) | ORAL | 1 refills | Status: DC

## 2023-08-10 NOTE — Telephone Encounter (Signed)
Called and spoke with patient. Cellcept refill sent to requested [facility] [place]. Nothing further at this time.

## 2023-08-23 ENCOUNTER — Ambulatory Visit: Payer: 59 | Admitting: Internal Medicine

## 2023-08-23 ENCOUNTER — Encounter: Payer: Self-pay | Admitting: Internal Medicine

## 2023-08-23 ENCOUNTER — Telehealth: Payer: Self-pay | Admitting: Internal Medicine

## 2023-08-23 VITALS — BP 135/85 | HR 64 | Temp 97.7°F | Ht 70.0 in | Wt 194.2 lb

## 2023-08-23 DIAGNOSIS — Z5971 Insufficient health insurance coverage: Secondary | ICD-10-CM

## 2023-08-23 DIAGNOSIS — Z5181 Encounter for therapeutic drug level monitoring: Secondary | ICD-10-CM

## 2023-08-23 DIAGNOSIS — I7121 Aneurysm of the ascending aorta, without rupture: Secondary | ICD-10-CM

## 2023-08-23 DIAGNOSIS — J849 Interstitial pulmonary disease, unspecified: Secondary | ICD-10-CM | POA: Diagnosis not present

## 2023-08-23 DIAGNOSIS — Z2989 Encounter for other specified prophylactic measures: Secondary | ICD-10-CM | POA: Diagnosis not present

## 2023-08-23 DIAGNOSIS — Z7185 Encounter for immunization safety counseling: Secondary | ICD-10-CM

## 2023-08-23 DIAGNOSIS — D849 Immunodeficiency, unspecified: Secondary | ICD-10-CM

## 2023-08-23 DIAGNOSIS — M3502 Sicca syndrome with lung involvement: Secondary | ICD-10-CM

## 2023-08-23 DIAGNOSIS — Z79899 Other long term (current) drug therapy: Secondary | ICD-10-CM | POA: Diagnosis not present

## 2023-08-23 LAB — CBC WITH DIFFERENTIAL/PLATELET
Basophils Absolute: 0 10*3/uL (ref 0.0–0.1)
Basophils Relative: 0.8 % (ref 0.0–3.0)
Eosinophils Absolute: 0.1 10*3/uL (ref 0.0–0.7)
Eosinophils Relative: 1.3 % (ref 0.0–5.0)
HCT: 44.6 % (ref 39.0–52.0)
Hemoglobin: 15.1 g/dL (ref 13.0–17.0)
Lymphocytes Relative: 24.6 % (ref 12.0–46.0)
Lymphs Abs: 1 10*3/uL (ref 0.7–4.0)
MCHC: 33.9 g/dL (ref 30.0–36.0)
MCV: 92.9 fL (ref 78.0–100.0)
Monocytes Absolute: 0.4 10*3/uL (ref 0.1–1.0)
Monocytes Relative: 10.2 % (ref 3.0–12.0)
Neutro Abs: 2.6 10*3/uL (ref 1.4–7.7)
Neutrophils Relative %: 63.1 % (ref 43.0–77.0)
Platelets: 241 10*3/uL (ref 150.0–400.0)
RBC: 4.8 Mil/uL (ref 4.22–5.81)
RDW: 13.5 % (ref 11.5–15.5)
WBC: 4.1 10*3/uL (ref 4.0–10.5)

## 2023-08-23 LAB — COMPREHENSIVE METABOLIC PANEL
ALT: 21 U/L (ref 0–53)
AST: 20 U/L (ref 0–37)
Albumin: 4.5 g/dL (ref 3.5–5.2)
Alkaline Phosphatase: 49 U/L (ref 39–117)
BUN: 12 mg/dL (ref 6–23)
CO2: 26 meq/L (ref 19–32)
Calcium: 9.4 mg/dL (ref 8.4–10.5)
Chloride: 104 meq/L (ref 96–112)
Creatinine, Ser: 0.85 mg/dL (ref 0.40–1.50)
GFR: 95.31 mL/min (ref >60.00)
Glucose, Bld: 99 mg/dL (ref 70–99)
Potassium: 4.3 meq/L (ref 3.5–5.1)
Sodium: 139 meq/L (ref 135–145)
Total Bilirubin: 1.1 mg/dL (ref 0.2–1.2)
Total Protein: 8 g/dL (ref 6.0–8.3)

## 2023-08-23 NOTE — Telephone Encounter (Signed)
Please call his pharmacy and prescribe AREXVY (specific type of RSV vaccine]

## 2023-08-23 NOTE — Patient Instructions (Addendum)
ICD-10-CM   1. Interstitial lung disease due to connective tissue disease (HCC)  J84.89    M35.9     2. Sjogren syndrome with lung involvement (HCC)  M35.02     3. High risk medication use  Z79.899     4. Medication monitoring encounter  Z51.81     5. Need for pneumocystis prophylaxis  Z29.89     6. Pneumococcal vaccination administered at current visit  Z23         ILD (interstitial lung disease) (HCC) due to SSJoogrens (ANA positive, Dry mouth, Lymphocytosis - bAL April 2024)  -Clinically stable.  Pulmonary function test is actually improved.     PLAN -  At this point in time I do not see a need for continued prednisone OR ANTIFIBROTIC -Continue Bactrim DS 1 tablet PO Mondays, Wednesdays, Fridays for PJP prophylaxis. 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   Suboptimal insurance coverage of laboratory tests done June 2024  Plan  -  believe you should be covered.  There is medical justification for coverage.  Follow-up - 46-month ; 15-minute visit but after spirometry and DLCO.

## 2023-08-23 NOTE — Progress Notes (Signed)
OV 12/19/2022 -transfer of care to see Dr. Marchelle Gearing in the interstitial lung disease center.  History is gained from talking to the patient review of the records and also the ILD questionnaire.  Subjective:  Patient ID: Joseph Hendricks, male , DOB: 1965-12-08 , age 58 y.o. , MRN: [id number] , ADDRESS: [address] [place] [address] PCP Bosie Clos, MD Patient Care Team: Bosie Clos, MD as PCP - General (Family Medicine)  This Provider for this visit: Treatment Team:  Attending Provider: Kalman Shan, MD    12/19/2022 -   Chief Complaint  Patient presents with   Consult    Consult for possible ILD, CT scan, and bronch results.     HPI Joseph Hendricks 58 y.o. 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  He is related to Dr. Andrey Spearman the surgeon [his brother-in-law].  He tells me that he has been previously well but in January 2024 all of a sudden he started with a cough that kept insidiously.  He is had 2 rounds of antibiotics and prednisone initially diagnosed also with pneumonia.  He felt he was really bad at the time and could not work.  The cough would get worse with sneezing and yawning.  He thought he might of had COVID at this time in January but no COVID test has been positive.  He subsequently saw Dr. Levy Pupa who controlled his cough with the help of acid reflux treatment.  He also subjective patient other treatment measures.  The cough gradually got away.  He has been stable to walk right now but he still gets fatigued and short of breath.  He says when he goes for his 2 mile walk 4 times a week he has to pause at the top.  This is not his baseline although it is better compared to earlier in the year when he was coughing significantly.  He says through this workup with Dr. Delton Coombes interstitial lung disease was diagnosed.  Then subsequently in April 2024 underwent bronchoscopy with lavage that shows 55%  lymphocytes.  Which means BAL with lymphocytosis.  His autoimmune antibody panel is positive for SSA SSB and related myositis antibody.  His ANA is also trace positive.  At 1: 80 showing cytoplasmic pattern.  Repeat ANA on the same day shows 1: 640 with a nuclear speckled pattern.  His hypersensitive pneumonitis antibody panel is normal.  The CT scan he underwent for evaluation of the cough is a super D CT scan.  I personally visualized it.  I agree it is not consistent with UIP.  I personally thought it look like NSIP.  However Dr. Dorothey Baseman feels that is consistent with organizing pneumonia but we all agree that alternative pattern.  He is awaiting rheumatology consultation but is not until October 2024.  Secure chatted DR Corliss Skains who upon review of the chart feels patient's features are all consistent with Sjogren's syndrome.  [He does have a history of dry mouth].  Detail ILD question as below    [facility] Comprehensive ILD Questionnaire  Symptoms:    Past Medical History :  -He has history of exercise-induced asthma for a few decades - Prior connective tissue diagnosis diagnosis is negative.  Although he is aware of his recent antibody panel positivity. - He has had COVID-vaccine but not formally the COVID disease although at the onset of the illness he thought he may have had COVID  ROS:  -Positive for fatigue for the last several months - Positive arthralgia for the last few years particularly in the fingers - She does have an occasional heartburn [previously had significant heartburn and he lost 25 pounds and his heartburn resolved].  He does occasionally snore -He does have dry mouth for years.  Is particularly worse in the morning.  But no dry hands  FAMILY HISTORY of LUNG DISEASE:  -Asthma in her brother but otherwise negative for family history of lung disease  PERSONAL EXPOSURE HISTORY:  -Smoked marijuana few x 35 to 40 years ago but otherwise no current marijuana  use no tobacco use no no cocaine use no intravenous drug use  HOME  EXPOSURE and HOBBY DETAILS :  -[place]  Home was built in 1989.  He has lived there past 21 years.  Detail organic antigen exposure history in the house is negative.  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  All corporate finance related.  Detail organic and inorganic antigen exposure history at work or else was negative.  PULMONARY TOXICITY HISTORY (27 items):  -Other than short course of prednisone he has not had any drugs that can cause pulmonary toxicity.  INVESTIGATIONS: As below    SUPER D CT 10/18/22 - personally visualzied, interpreted but agree with findings below -per email communication with Dr. Dorothey Baseman for second opinionm " This looks like organizing PNA to me.  Sjgren syndrome can present with an OP pattern.  Could also be due to some other insult like a drug reaction.    arrative & Impression  CLINICAL DATA:  Lung nodule. History of pneumonia with abnormal pulmonary function tests.   EXAM: CT CHEST WITHOUT CONTRAST   TECHNIQUE: Multidetector CT imaging of the chest was performed using thin slice collimation for electromagnetic bronchoscopy planning purposes, without intravenous contrast.   RADIATION DOSE REDUCTION: This exam was performed according to the departmental dose-optimization program which includes automated exposure control, adjustment of the mA and/or kV according to patient size and/or use of iterative reconstruction technique.   COMPARISON:  None Available.   FINDINGS: Cardiovascular: 07/31/2012.   Mediastinum/Nodes: Atherosclerotic calcification of the aorta. Ascending aorta measures 4.3 cm (5/51). Comparison with 07/31/2012 is challenging in the absence of coronal reformatted images. Heart is enlarged. No pericardial effusion.   Lungs/Pleura: No pathologically enlarged mediastinal or  axillary lymph nodes. Hilar regions are difficult to definitively evaluate without IV contrast. Esophagus is grossly unremarkable.   Upper Abdomen: Patchy peripheral and basilar coarsened ground-glass and slight consolidation with traction bronchiectasis, new from 07/31/2012. No pleural fluid. Airway is unremarkable.   Musculoskeletal: Visualized portions of the liver, gallbladder adrenal glands are unremarkable. Low-attenuation lesion in the right kidney. No specific follow-up necessary. Visualized portions of the kidneys, spleen, pancreas, stomach and bowel are otherwise grossly unremarkable. No upper abdominal adenopathy.   IMPRESSION: No worrisome lytic or sclerotic lesions.   1. Pulmonary parenchymal pattern of peripheral and basilar coarsened ground-glass, slight consolidation and traction bronchiectasis, new from 07/31/2012 and likely due to the sequelae of COVID-19 pneumonia. Findings are suggestive of an alternative diagnosis (not UIP) per consensus guidelines: Diagnosis of Idiopathic Pulmonary Fibrosis: An Official ATS/ERS/JRS/ALAT Clinical Practice Guideline. Am Rosezetta Schlatter Crit Care Med Vol 198, Iss 5, [id number], Mar 10 2017. 2. 4.3 cm ascending aortic aneurysm. Recommend annual imaging followup by CTA or MRA. This recommendation follows 2010 ACCF/AHA/AATS/ACR/ASA/SCA/SCAI/SIR/STS/SVM Guidelines for the Diagnosis and Management of Patients with Thoracic  Aortic Disease. Circulation. 2010; 121: Z610-R604. Aortic aneurysm NOS (ICD10-I71.9). 3.  Aortic atherosclerosis (ICD10-I70.0).     Electronically Signed   By: Leanna Battles M.D.   On: 10/19/2022 13:41     Latest Reference Range & Units 10/27/22 11:31  Anti-Jo-1 Ab (RDL) <20 Units <20  Anti-PL-7 Ab (RDL) Negative  Negative  Anti-PL-12 Ab (RDL) Negative  Negative  Anti-EJ Ab (RDL) Negative  Negative  Anti-OJ Ab (RDL) Negative  Negative  Anti-SRP Ab (RDL) Negative  Negative  Anti-Mi-2 Ab (RDL) Negative  Negative   VWUJ-WJX-9JYNWG Ab (RDL) <20 Units <20  Anti-MDA-5 Ab (CADM-140)(RDL) <20 Units <20  Anti-NXP-2 (P140) Ab (RDL) <20 Units <20  Anti-SAE1 Ab, IgG (RDL) <20 Units <20  Anti-PM/Scl-100 Ab (RDL) <20 Units <20  Anti-Ku Ab (RDL) Negative  Weak Positive !  Anti-SS-A 52kD Ab, IgG (RDL) <20 Units >200 (H)  Anti-U1 RNP Ab (RDL) <20 Units <20  Anti-U2 RNP Ab (RDL) Negative  Negative  Anti-U3 RNP (Fibrillarin)(RDL) Negative  Negative  !: Data is abnormal (H): Data is abnormally high    Latest Reference Range & Units 05/06/21 07:28 10/27/22 11:30  ENA RNP Ab 0.0 - 0.9 AI  <0.2  SSA (Ro) (ENA) Antibody, IgG <1.0 NEG AI  >8.0 POS !  SSB (La) (ENA) Antibody, IgG <1.0 NEG AI  1.2 POS !  Scleroderma (Scl-70) (ENA) Antibody, IgG <1.0 NEG AI  <1.0 NEG  Prostate Specific Ag, Serum 0.0 - 4.0 ng/mL 0.4   !: Data is abnormal    Latest Reference Range & Units 10/27/22 11:30  Anti Nuclear Antibody (ANA) NEGATIVE  POSITIVE !  ANA Pattern 1  Cytoplasmic !  ANA Titer 1 titer 1:80 (H)  ANCA SCREEN Negative  Negative  Cyclic Citrullin Peptide Ab UNITS <16  ds DNA Ab IU/mL 2  ENA RNP Ab 0.0 - 0.9 AI <0.2  RA Latex Turbid. <14 IU/mL 12  !: Data is abnormal (H): Data is abnormally high    Latest Reference Range & Units 06/10/14 00:00 05/14/15 07:44 04/14/16 07:45 06/13/17 07:54 04/29/18 07:57 04/29/19 08:14 04/29/20 07:22 05/06/21 07:28 11/06/22 05:47  Hemoglobin 13.0 - 17.0 g/dL 95.6 CANCELED 21.3 08.6 14.8 14.4 14.7 14.8 14.4     Latest Reference Range & Units 10/27/22 11:30  A.Fumigatus #1 Abs Negative  Negative  Micropolyspora faeni, IgG Negative  Negative  Thermoactinomyces vulgaris, IgG Negative  Negative  A. Pullulans Abs Negative  Negative  Thermoact. Saccharii Negative  Negative  Pigeon Serum Abs Negative  Negative     Latest Reference Range & Units 12/19/22 00:00  ENA SSA (RO) Ab 0.0 - 0.9 AI >8.0 (H)  ENA SSB (LA) Ab 0.0 - 0.9 AI 2.2 (H)  (H): Data is abnormally high   12/19/22 00:00   Speckled Pattern 1:1280 (H)  (H): Data is abnormally high  OV 01/04/2023  Subjective:  Patient ID: Joseph Hendricks, male , DOB: [birth year] , age 66 y.o. , MRN: [id number] , ADDRESS: [address] [place] [address] PCP Bosie Clos, MD Patient Care Team: Bosie Clos, MD as PCP - General (Family Medicine)  This Provider for this visit: Treatment Team:  Attending Provider: Kalman Shan, MD  Type of visit: Video Virtual Visit Identification of patient Joseph Hendricks with 1966/03/19 and MRN [id number] - 2 person identifier Risks: Risks, benefits, limitations of telephone visit explained. Patient understood and verbalized agreement to proceed Anyone else on call: Just him Patient location: Appears to be in his office This  provider location: [address], [address]; [place]; [place] [address]. [facility]. [phone]    01/04/2023 -   Chief Complaint  Patient presents with   Follow-up    F/up on ILD, confused about which Dr will be taking over ILD care.     HPI Joseph Hendricks 58 y.o. -in this video visit Joseph Hendricks tells me that he is doing much better after starting prednisone he feels better.  The prednisone is increasing his hunger and giving some insomnia but he is taking melatonin now.  He is taking his Bactrim.  He is also taking his CellCept.  He met with pharmacy.  Reviewed his record he also met with rheumatologist Dr. Corliss Skains yesterday.  He is pleased with both visits.  He did have some labs.  He is has got upcoming safety labs.  He wanted to know who would be his primary physician.  I did explain to him with the collaborative support between rheumatology and myself and pulmonary.  I did indicate to him that I will be following the pulmonary side.  Did indicate to him that patient have clinical pharmacist and many times if the primary problem is interstitial lung disease Dr Shan Levans will make recommendations and allow me to  adjust his medications.  However I did indicate to him he needs continued monitoring from rheumatology over time because of extrapulmonary manifestations associated with Sjogren's.  He verbalized understanding.   There are no other new issues.  This visit is video check-in    OV 04/09/2023  Subjective:  Patient ID: Joseph Hendricks, male , DOB: Apr 23, 1966 , age 64 y.o. , MRN: [id number] , ADDRESS: [address] [place] [address] PCP Bosie Clos, MD Patient Care Team: Bosie Clos, MD as PCP - General (Family Medicine)  This Provider for this visit: Treatment Team:  Attending Provider: Kalman Shan, MD  Sjogren's with interstitial lung disease  Treatment program starting 12/24/2022-      Increase prednisone to 60mg  PO daily x 2 weeks( originally started on 40 mg/day on 12/19/2022], then 50mg  daily x 2 weeks, then 40mg  daily x 2 weeks, then 30 mg daily x 2 weeks and then 20mg  daily x 2 weeks and then 10mg  daily to continue Start Bactrim DS 1 tablet PO Mondays, Wednesdays, Fridays for PJP prophylaxis. Start Cellcept 500mg  PO twice daily x 2 weeks. Repeat CBC and CMP. If stable, increase to 1000mg  twice daily.  04/09/2023 -   Chief Complaint  Patient presents with   Consult   Follow-up    ILD      HPI Joseph Hendricks 58 y.o. -returns for follow-up.  #Interstitial lung disease due to Sjogren: He is now taking CellCept at full dose 1000 mg twice daily.  He is finished his prednisone 2 to 3 weeks ago.  Is having some more shortness of breath and fatigue but shortness of breath scale is the same fatigue scale might be slightly worse.  He feels because of prednisone withdrawal but his pulmonary function test actually shows improvement and this was reassuring.  His sit/stand hypoxemia test was also normal.  #Steroid withdrawal: Having slightly more fatigue and shortness of breath without prednisone but we took a shared decision making to monitor because pulmonary  function test was improved  #New problem of bilateral eyelid swelling particularly in the lower eyelids been going on for 2 or 3 weeks after he stopped prednisone.  I told him that I suspect  this because with Sjogren's he is making tears in his tear ducts clogged.  He does not use bluelight for his eyeglasses.  I told him to get bluelight.  Also advised him to wear warm compress.  #Other symptoms: He does have some dizziness when he bends forward and he has shortness of breath but otherwise is able to walk 2 miles 4 times a week and just gets mild shortness of breath with uphill.  He is still able to keep up with his wife and the walk exercise..  Did indicate to him to get a pulse ox monitor and monitor.  The dizziness comes with crunching and it is expected in ILD patients.  Told him to lose some weight and take care.  #Vaccination: He will have Prevnar today.  We discussed RSV vaccine.  He is below 60 but is immunosuppressed.  Overall risk profile suggest that he should take the RSV vaccine.  He will pay out-of-pocket for this.     OV 08/23/2023  Subjective:  Patient ID: Joseph Hendricks, male , DOB: [birth year] , age 52 y.o. , MRN: [id number] , ADDRESS: [address] [place] [address] PCP Bosie Clos, MD Patient Care Team: Bosie Clos, MD as PCP - General (Family Medicine) Kalman Shan, MD as Consulting Physician (Pulmonary Disease)  This Provider for this visit: Treatment Team:  Attending Provider: Kalman Shan, MD    08/23/2023 -   Chief Complaint  Patient presents with   Follow-up    ILD F/U     HPI Joseph Hendricks 58 y.o. -returns for follow-up.  Since his last visit from a respiratory standpoint he continues to feel better.  His sit/stand exercise hypoxemia test is stable.  His pulmonary function test shows improvement.  He continues just on CellCept 1000 mg twice daily.  He is off prednisone at this point.  After he came off prednisone some of  his joint pain and restricted mobility is coming back.  I indicated to him some of this could be steroid withdrawal but if if it reflects autoimmune disease then he needs to talk to rheumatology about increasing immunosuppression or going back on steroids.  He said he would follow-up with Dr. Algis Downs.  In the interim he did have COVID.  He continues his Bactrim prophylaxis.  He has not had RSV vaccine and I believe because he is less than 60 he needs a prescription for it.  He does not mind paying for it himself.  I will tell my staff to call the pharmacy and order it.  He did tell me that in June 2025 visit the labs are ordered or partially being denied by insurance.  I reviewed the codes and I have added the correct codes.  These labs are all medically justified including vitamin D and TPMT and G6PD.  I have also contacted our coder about this.  Of note he has thoracic aortic aneurysm.  His cardiac surgeon Dr. Dorris Fetch said that he could get a high-resolution CT chest and they could follow-up with that.  Therefore we will order 1 in April 2025.  Otherwise I will see him in 6 months with spirometry.   SYMPTOM SCALE - ILD 12/19/2022 04/09/2023 CellCept 1000 mg twice daily and of chronic prednisone for the last 10 days. 08/23/2023 CellCept 1000 mg twice daily  Current weight     O2 use ra Room air ra  Shortness of Breath 0 -> 5 scale with 5 being worst (score 6 If  unable to do)    At rest 0 0 0  Simple tasks - showers, clothes change, eating, shaving 0 0 0  Household (dishes, doing bed, laundry) 1.5 2 1   Shopping 0 0 0  Walking level at own pace 0 0 0  Walking up Stairs 2.5 2 1   Total (30-36) Dyspnea Score 4 4 2       Non-dyspnea symptoms (0-> 5 scale) 12/19/2022 04/09/2023  08/23/2023   How bad is your cough? 0 0 0  How bad is your fatigue 1.5 2 1   How bad is nausea 0 0 0  How bad is vomiting?  0 0 0  How bad is diarrhea? 0 0 0  How bad is anxiety? 1.5 1 1   How bad is depression 0 0 0  Any  chronic pain - if so where and how bad no       Simple office walk 224 (66+46 x 2) feet Pod A at [facility] x  3 laps goal with forehead probe 12/19/2022  04/09/2023  08/23/2023   O2 used ra ra   Number laps completed Sit stand x 15 Sit/stand x 10   Comments about pace good    Resting Pulse Ox/HR 96% and 73/min 95%/90/min   Final Pulse Ox/HR 97% and 104/min 97%/111   Desaturated </= 88% no    Desaturated <= 3% points no    Got Tachycardic >/= 90/min yes    Symptoms at end of test Mildd dypsne    Miscellaneous comments x      PFT     Latest Ref Rng & Units 06/27/2023    8:28 AM 04/04/2023   10:32 AM 12/05/2022   10:59 AM  PFT Results  FVC-Pre L 3.88  3.82  3.41   FVC-Predicted Pre % 80  79  70   FVC-Post L   3.44   FVC-Predicted Post %   71   Pre FEV1/FVC % % 81  81  81   Post FEV1/FCV % %   84   FEV1-Pre L 3.14  3.11  2.77   FEV1-Predicted Pre % 86  84  75   FEV1-Post L   2.89   DLCO uncorrected ml/min/mmHg 21.57  20.33  19.84   DLCO UNC% % 77  72  70   DLCO corrected ml/min/mmHg  20.33  19.95   DLCO COR %Predicted %  72  71   DLVA Predicted % 97  97  95   TLC L   5.24   TLC % Predicted %   74   RV % Predicted %   89        LAB RESULTS last 96 hours No results found.       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   reports that he has never smoked. He has never been exposed to tobacco smoke. He has never used smokeless tobacco.  Past Surgical History:  Procedure Laterality Date   APPENDECTOMY     BRONCHIAL BIOPSY  11/06/2022   Procedure: BRONCHIAL BIOPSIES;  Surgeon: Leslye Peer, MD;  Location: [facility] ENDOSCOPY;  Service: Pulmonary;;   BRONCHIAL WASHINGS   11/06/2022   Procedure: BRONCHIAL WASHINGS;  Surgeon: Leslye Peer, MD;  Location[facility] ENDOSCOPY;  Service: Pulmonary;;   COLONOSCOPY  HERNIA REPAIR     KNEE SURGERY Left    SHOULDER SURGERY Right    SQUAMOUS CELL CARCINOMA EXCISION     x3 or 4 per patient, bilateral arms, back and leg   VIDEO BRONCHOSCOPY Bilateral 11/06/2022   Procedure: VIDEO BRONCHOSCOPY WITH FLUORO;  Surgeon: Leslye Peer, MD;  Location: [facility] ENDOSCOPY;  Service: Pulmonary;  Laterality: Bilateral;   WISDOM TOOTH EXTRACTION      No Known Allergies  Immunization History  Administered Date(s) Administered   Hepatitis B 01/24/2002   Influenza Inj Mdck Quad Pf 05/30/2022   Influenza,inj,Quad PF,6+ Mos 04/12/2016, 04/28/2020, 05/04/2021   Influenza-Unspecified 04/10/2015, 03/29/2023   Moderna Covid-19 Fall Seasonal Vaccine 36yrs & older 05/30/2022   Moderna Covid-19 Vaccine Bivalent Booster 78yrs & up 05/19/2021   Moderna Sars-Covid-2 Vaccination 09/17/2019, 10/17/2019, 05/28/2020   PNEUMOCOCCAL CONJUGATE-20 04/09/2023   Pfizer Covid-19 Vaccine Bivalent Booster 64yrs & up 03/29/2023   Pneumococcal Polysaccharide-23 06/09/2014   Td 04/23/2018   Tdap 02/03/2008   Typhoid Inactivated 01/24/2002   Zoster Recombinant(Shingrix) 04/28/2020    Family History  Problem Relation Age of Onset   Arthritis Mother        osteo   Factor V Leiden deficiency Mother    Diabetes Father    Alzheimer's disease Father    Healthy Sister    Allergies Brother    Cancer Brother        testicular   Healthy Brother    Healthy Son    Healthy Daughter    Healthy Daughter      Current Outpatient Medications:    azelastine (ASTELIN) 0.1 % nasal spray, Place 2 sprays into both nostrils in the morning. Use in each nostril as directed, Disp: 30 mL, Rfl: 1   Cholecalciferol (VITAMIN D3 PO), Take 1 tablet by mouth at bedtime., Disp: , Rfl:    fluticasone (FLONASE) 50 MCG/ACT nasal spray, Place 2 sprays into both nostrils in the  morning and at bedtime. (Patient taking differently: Place 2 sprays into both nostrils as needed.), Disp: , Rfl:    lovastatin (MEVACOR) 20 MG tablet, TAKE 1 TABLET BY MOUTH AT BEDTIME, Disp: 90 tablet, Rfl: 3   Multiple Vitamins-Minerals (MENS MULTIVITAMIN PLUS) TABS, Take 1 tablet by mouth at bedtime., Disp: , Rfl:    mycophenolate (CELLCEPT) 500 MG tablet, Take 2 tablets (1,000 mg total) by mouth 2 (two) times daily., Disp: 360 tablet, Rfl: 1   sulfamethoxazole-trimethoprim (BACTRIM DS) 800-160 MG tablet, Take one tablet by mouth on Mondays, Wednesdays, and Fridays only., Disp: 36 tablet, Rfl: 2      Objective:   Vitals:   08/23/23 1304  BP: 135/85  Pulse: 64  Temp: 97.7 F (36.5 C)  TempSrc: Oral  SpO2: 97%  Weight: 194 lb 3.2 oz (88.1 kg)  Height: 5\' 10"  (1.778 m)    Estimated body mass index is 27.86 kg/m as calculated from the following:   Height as of this encounter: 5\' 10"  (1.778 m).   Weight as of this encounter: 194 lb 3.2 oz (88.1 kg).  @WEIGHTCHANGE @  [employer]   08/23/23 1304  Weight: 194 lb 3.2 oz (88.1 kg)     Physical Exam   General: No distress. Looks well O2 at rest: no Cane present: no Sitting in wheel chair: no Frail: no Obese: no Neuro: Alert and Oriented x 3. GCS 15. Speech normal Psych: Pleasant Resp:  Barrel Chest - no.  Wheeze - no, Crackles - no, No overt respiratory distress CVS: Normal  heart sounds. Murmurs - n Ext: Stigmata of Connective Tissue Disease - ono HEENT: Normal upper airway. PEERL +. No post nasal drip        Assessment:       ICD-10-CM   1. ILD (interstitial lung disease) (HCC)  J84.9 CBC w/Diff    Comp Met (CMET)    CT Chest High Resolution    Pulmonary function test    2. Need for pneumocystis prophylaxis  Z29.89 CBC w/Diff    Comp Met (CMET)    CT Chest High Resolution    Pulmonary function test    3. Sjogren syndrome with lung involvement (HCC)  M35.02 CBC w/Diff    Comp Met (CMET)    CT Chest High  Resolution    Pulmonary function test    4. High risk medication use  Z79.899 CBC w/Diff    Comp Met (CMET)    CT Chest High Resolution    Pulmonary function test    5. Medication monitoring encounter  Z51.81 CBC w/Diff    Comp Met (CMET)    CT Chest High Resolution    Pulmonary function test    6. Immunosuppressed status (HCC)  D84.9     7. Aneurysm of ascending aorta without rupture (HCC)  I71.21     8. Insurance coverage problems  Z59.71     9. Vaccine counseling  Z71.85          Plan:     Patient Instructions     ICD-10-CM   1. Interstitial lung disease due to connective tissue disease (HCC)  J84.89    M35.9     2. Sjogren syndrome with lung involvement (HCC)  M35.02     3. High risk medication use  Z79.899     4. Medication monitoring encounter  Z51.81     5. Need for pneumocystis prophylaxis  Z29.89     6. Pneumococcal vaccination administered at current visit  Z23         ILD (interstitial lung disease) (HCC) due to SSJoogrens (ANA positive, Dry mouth, Lymphocytosis - bAL April 2024)  -Clinically stable.  Pulmonary function test is actually improved.     PLAN -  At this point in time I do not see a need for continued prednisone OR ANTIFIBROTIC -Continue Bactrim DS 1 tablet PO Mondays, Wednesdays, Fridays for PJP prophylaxis. 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   Suboptimal insurance coverage of laboratory tests done June 2024  Plan  -  believe you should be covered.  There is medical justification for  coverage.  Follow-up - 57-month ; 15-minute visit but after spirometry and DLCO.   FOLLOWUP Return in about 6 months (around 02/20/2024) for 15 min visit, after Cleda Daub and DLCO, with Dr Marchelle Gearing.     HIGh Complexity  OFFICE   2021 E/M guidelines, first released in 2021, with minor revisions added in 2023. Must meet the requirements for 2 out of 3 dimensions to qualify.    Number and complexity of problems addressed Amount and/or complexity of data reviewed Risk  of complications and/or morbidity  Severe exacerbation of chronic illness  Acute or chronic illnesses that may pose a threat to life or bodily function, e.g., multiple trauma, acute MI, pulmonary embolus, severe respiratory distress, progressive rheumatoid arthritis, psychiatric illness with potential threat to self or others, peritonitis, acute renal failure, abrupt change in neurological status Must meet the requirements for 2 of 3 of the categories)  Category 1: Tests and documents, historian  Any combination of 3 of the following:  Assessment requiring an independent historian  Review of prior external note(s) from each unique source  Review of results of each unique test  Ordering of each unique test    Category 2: Interpretation of tests    Independent interpretation of a test performed by another physician/other qualified health care professional (not separately reported)  Category 3: Discuss management/tests  Discussion of management or test interpretation with external physician/other qualified health care professional/appropriate source (not separately reported)  HIGH risk of morbidity from additional diagnostic testing or treatment Examples only:  Drug therapy requiring intensive monitoring for toxicity  Decision for elective major surgery with identified pateint or procedure risk factors  Decision regarding hospitalization or escalation of level of care  Decision for DNR or to de-escalate  care   Parenteral controlled  substances              SIGNATURE    Dr. Kalman Shan, M.D., F.C.C.P,  Pulmonary and Critical Care Medicine Staff Physician, [facility] Director - Interstitial Lung Disease  Program  P[facility] [place], [place], [address]  Pager: [phone], If no answer or between  15:00h - 7:00h: call [phone] Telephone: [phone]  1:46 PM 08/23/2023

## 2023-08-24 NOTE — Telephone Encounter (Signed)
[facility]. No pickup

## 2023-08-28 NOTE — Telephone Encounter (Signed)
[facility], I have been unable to reach any pharmacist regarding this vaccine per Dr Marchelle Gearing. Fowarding to MR so he is aware.

## 2023-08-30 ENCOUNTER — Telehealth: Payer: Self-pay | Admitting: Internal Medicine

## 2023-08-30 DIAGNOSIS — Z2989 Encounter for other specified prophylactic measures: Secondary | ICD-10-CM

## 2023-08-30 NOTE — Telephone Encounter (Signed)
Patient needs refill of bactrim and anew . He also needs prescription for RSV Vaccine because of his age.   [facility]: [facility] [address]

## 2023-08-31 MED ORDER — RSVPREF3 VAC RECOMB ADJUVANTED 120 MCG/0.5ML IM SUSR: 0.5000 mL | Freq: Once | INTRAMUSCULAR | 0 refills | Status: AC

## 2023-08-31 MED ORDER — SULFAMETHOXAZOLE-TRIMETHOPRIM 800-160 MG PO TABS: ORAL_TABLET | ORAL | 2 refills | Status: DC

## 2023-08-31 NOTE — Telephone Encounter (Signed)
Spoke with the pt and notified I have sent rsv vaccine and bactrim ds to pharm  Nothing further needed

## 2023-08-31 NOTE — Telephone Encounter (Signed)
 done

## 2023-09-10 NOTE — Progress Notes (Signed)
 Office Visit Note  Patient: Joseph Hendricks             Date of Birth: 08-28-1965           MRN: [id number]             PCP: Bosie Clos, MD Referring: Bosie Clos, MD Visit Date: 09/24/2023 Occupation: @GUAROCC @  Subjective:  Pain in shoulders and hips  History of Present Illness: Joseph Hendricks is a 58 y.o. male with ILD and Sjogren's.  He states he has been doing well on CellCept 500 mg 2 tablets twice a day.  He has noticed improvement in his pulmonary symptoms.  Has been off prednisone.  He has been taking Bactrim prophylaxis.  He states since he has been off of steroids he has been having increased discomfort in his shoulders and his hips which she describes over the SI joints.  None of the other joints are painful.  He denies any history of joint swelling.  He states dry mouth and dry eye symptoms are negligible.    Activities of Daily Living:  Patient reports morning stiffness for a few minutes.   Patient Denies nocturnal pain.  Difficulty dressing/grooming: Denies Difficulty climbing stairs: Denies Difficulty getting out of chair: Denies Difficulty using hands for taps, buttons, cutlery, and/or writing: Denies  Review of Systems  Constitutional:  Negative for fatigue.  HENT:  Negative for mouth sores and mouth dryness.   Eyes:  Negative for dryness.  Respiratory:  Negative for shortness of breath and difficulty breathing.   Cardiovascular:  Negative for chest pain and palpitations.  Gastrointestinal:  Negative for blood in stool, constipation and diarrhea.  Endocrine: Negative for increased urination.  Genitourinary:  Negative for involuntary urination.  Musculoskeletal:  Positive for joint pain, joint pain, myalgias, morning stiffness and myalgias. Negative for gait problem, joint swelling, muscle weakness and muscle tenderness.  Skin:  Negative for color change, rash, hair loss and sensitivity to sunlight.  Allergic/Immunologic: Negative for  susceptible to infections.  Neurological:  Negative for dizziness and headaches.  Hematological:  Negative for swollen glands.  Psychiatric/Behavioral:  Negative for depressed mood and sleep disturbance. The patient is not nervous/anxious.     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   in [place] - patient is not aware of this dx as of 11/03/22   Asthma    patient denies this dx as 11/03/22   Chronic cough 10/2022   GERD (gastroesophageal reflux disease)    HLD (hyperlipidemia)    Interstitial lung disease (HCC) 10/2022   Pneumonia    x 1   Seasonal allergies    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   right forearm, left wrist, calf   Tuberculosis    patient denies this dx as of 11/03/22    Family History  Problem Relation Age of Onset   Arthritis Mother  osteo   Factor V Leiden deficiency Mother    Diabetes Father    Alzheimer's disease Father    Healthy Sister    Allergies Brother    Cancer Brother        testicular   Healthy Brother    Healthy Son    Healthy Daughter    Healthy Daughter    Past Surgical History:  Procedure Laterality Date   APPENDECTOMY     BRONCHIAL BIOPSY  11/06/2022   Procedure: BRONCHIAL BIOPSIES;  Surgeon: Leslye Peer, MD;  Location: [facility] ENDOSCOPY;  Service: Pulmonary;;   BRONCHIAL WASHINGS  11/06/2022   Procedure:  BRONCHIAL WASHINGS;  Surgeon: Leslye Peer, MD;  Location: [facility] ENDOSCOPY;  Service: Pulmonary;;   COLONOSCOPY     HERNIA REPAIR     KNEE SURGERY Left    SHOULDER SURGERY Right    SQUAMOUS CELL CARCINOMA EXCISION     x3 or 4 per patient, bilateral arms, back and leg   VIDEO BRONCHOSCOPY Bilateral 11/06/2022   Procedure: VIDEO BRONCHOSCOPY WITH FLUORO;  Surgeon: Leslye Peer, MD;  Location: [facility] ENDOSCOPY;  Service: Pulmonary;  Laterality: Bilateral;   WISDOM TOOTH EXTRACTION     Social History   Social History Narrative   Not on file   Immunization History  Administered Date(s) Administered   Hepatitis B 01/24/2002   Influenza Inj Mdck Quad Pf 05/30/2022   Influenza,inj,Quad PF,6+ Mos 04/12/2016, 04/28/2020, 05/04/2021   Influenza-Unspecified 04/10/2015, 03/29/2023   Moderna Covid-19 Fall Seasonal Vaccine 73yrs & older 05/30/2022   Moderna Covid-19 Vaccine Bivalent Booster 61yrs & up 05/19/2021   Moderna Sars-Covid-2 Vaccination 09/17/2019, 10/17/2019, 05/28/2020   PNEUMOCOCCAL CONJUGATE-20 04/09/2023   Pfizer Covid-19 Vaccine Bivalent Booster 83yrs & up 03/29/2023   Pneumococcal Polysaccharide-23 06/09/2014   Td 04/23/2018   Tdap 02/03/2008   Typhoid Inactivated 01/24/2002   Zoster Recombinant(Shingrix) 04/28/2020     Objective: Vital Signs: BP 139/88 (BP Location: Left Arm, Patient Position: Sitting, Cuff Size: Normal)   Pulse 67   Resp 15   Ht 5\' 10"  (1.778 m)   Wt 196 lb 6.4 oz (89.1 kg)   BMI 28.18 kg/m    Physical Exam Vitals and nursing note reviewed.  Constitutional:      Appearance: He is well-developed.  HENT:     Head: Normocephalic and atraumatic.  Eyes:     Conjunctiva/sclera: Conjunctivae normal.     Pupils: Pupils are equal, round, and reactive to light.  Cardiovascular:     Rate and Rhythm: Normal rate and regular rhythm.     Heart sounds: Normal heart sounds.  Pulmonary:     Effort: Pulmonary effort is normal.     Breath sounds: Normal  breath sounds.  Abdominal:     General: Bowel sounds are normal.     Palpations: Abdomen is soft.  Musculoskeletal:     Cervical back: Normal range of motion and neck supple.  Skin:    General: Skin is warm and dry.     Capillary Refill: Capillary refill takes less than 2 seconds.  Neurological:     Mental Status: He is alert and oriented to person, place, and time.  Psychiatric:        Behavior: Behavior normal.      Musculoskeletal Exam: Cervical, thoracic or lumbar spine 1 good range of motion.  Shoulders, elbows, wrist joints, MCPs PIPs and DIPs with good range of motion.  He had some discomfort with internal rotation of his right shoulder.  He had  tenderness over SI joints.  There was no tenderness over trochanteric bursa.  He had good range of motion of his hip joints.  Knee joints were in good range of motion.  There was no tenderness over ankles or MTPs.  CDAI Exam: CDAI Score: -- Patient Global: --; Provider Global: -- Swollen: --; Tender: -- Joint Exam 09/24/2023   No joint exam has been documented for this visit   There is currently no information documented on the homunculus. Go to the Rheumatology activity and complete the homunculus joint exam.  Investigation: No additional findings.  Imaging: No results found.  Recent Labs: Lab Results  Component Value Date   WBC 4.1 08/23/2023   HGB 15.1 08/23/2023   PLT 241.0 08/23/2023   NA 139 08/23/2023   K 4.3 08/23/2023   CL 104 08/23/2023   CO2 26 08/23/2023   GLUCOSE 99 08/23/2023   BUN 12 08/23/2023   CREATININE 0.85 08/23/2023   BILITOT 1.1 08/23/2023   ALKPHOS 49 08/23/2023   AST 20 08/23/2023   ALT 21 08/23/2023   PROT 8.0 08/23/2023   ALBUMIN 4.5 08/23/2023   CALCIUM 9.4 08/23/2023   GFRAA 101 04/29/2020   QFTBGOLDPLUS NEGATIVE 12/19/2022    Speciality Comments: No specialty comments available.  Procedures:  No procedures performed Allergies: Patient has no known allergies.   Assessment /  Plan:     Visit Diagnoses: ILD (interstitial lung disease) (HCC) - NSIP pattern.  He has been off prednisone.  He has been on CellCept 500 mg 2 tablets p.o. twice daily since June 2024.  He states his pulmonary symptoms remarkably improved.  He has been walking for exercise.  He has not had much shortness of breath.  He was evaluated by Dr. Colletta Maryland on August 23, 2023.  I reviewed the office visit note.  His PFTs improved.  Dr. Colletta Maryland did not recommend any change in treatment.  He recommended repeat high-resolution CT in April 2025.  And DLCO every 6 months.  Sjogren's syndrome with other organ involvement (HCC) - Positive ANA, positive SSA antibody, positive SSB antibody.  Patient has minimal dry mouth and dry eye symptoms.  He does not require any over-the-counter medications.  High risk medication use - CellCept 500 mg p.o., 2 tablets p.o. twice daily (prescribed by Dr. Colletta Maryland) Bactrim DS 3 times a week.  Labs obtained in February 2025 CBC and CMP were normal.  He was advised to get labs every 3 months.  Labs are monitored by Dr. Colletta Maryland.  Information immunization was placed in the AVS.  Chronic pain of both shoulders-he has been experiencing some discomfort in his shoulders for the last few months.  He had discomfort with internal rotation of the shoulder.  Use of topical diclofenac gel was advised.  A handout on shoulder exercises was given.  I also offered physical therapy but he would like to wait.  I advised future cortisone injection if his symptoms do not improve.  Chronic SI joint pain-he has been experiencing some discomfort in the SI joints.  He states he sits for prolonged time but has to walk some at work as well.  Some of the stretching exercises were demonstrated in the office.  I also gave him some exercises for the lower back.  Other medical problems are listed as follows:  Mild intermittent asthma without complication - Mild exercise-induced asthma.  Seasonal allergic  rhinitis, unspecified trigger  Gastroesophageal reflux disease without esophagitis  Benign fibroma of prostate  Bergmann's syndrome  Basal  cell carcinoma (BCC), unspecified site - Mild exercise-induced asthma.  Hypercholesteremia  Family history of neurological disease-Alzheimer's in his father and paternal grandmother  Orders: No orders of the defined types were placed in this encounter.  No orders of the defined types were placed in this encounter.    Follow-Up Instructions: Return in about 5 months (around 02/24/2024) for ILD, Sjogren's.   Pollyann Savoy, MD  Note - This record has been created using Animal nutritionist.  Chart creation errors have been sought, but may not always  have been located. Such creation errors do not reflect on  the standard of medical care.

## 2023-09-14 ENCOUNTER — Encounter: Payer: Self-pay | Admitting: Internal Medicine

## 2023-09-24 ENCOUNTER — Encounter: Payer: Self-pay | Admitting: Rheumatology

## 2023-09-24 ENCOUNTER — Ambulatory Visit: Payer: 59 | Attending: Rheumatology | Admitting: Rheumatology

## 2023-09-24 VITALS — BP 139/88 | HR 67 | Resp 15 | Ht 70.0 in | Wt 196.4 lb

## 2023-09-24 DIAGNOSIS — Z79899 Other long term (current) drug therapy: Secondary | ICD-10-CM

## 2023-09-24 DIAGNOSIS — K219 Gastro-esophageal reflux disease without esophagitis: Secondary | ICD-10-CM

## 2023-09-24 DIAGNOSIS — C4491 Basal cell carcinoma of skin, unspecified: Secondary | ICD-10-CM

## 2023-09-24 DIAGNOSIS — J849 Interstitial pulmonary disease, unspecified: Secondary | ICD-10-CM | POA: Diagnosis not present

## 2023-09-24 DIAGNOSIS — M533 Sacrococcygeal disorders, not elsewhere classified: Secondary | ICD-10-CM

## 2023-09-24 DIAGNOSIS — Z820 Family history of epilepsy and other diseases of the nervous system: Secondary | ICD-10-CM

## 2023-09-24 DIAGNOSIS — M25511 Pain in right shoulder: Secondary | ICD-10-CM | POA: Diagnosis not present

## 2023-09-24 DIAGNOSIS — M3509 Sicca syndrome with other organ involvement: Secondary | ICD-10-CM

## 2023-09-24 DIAGNOSIS — E7800 Pure hypercholesterolemia, unspecified: Secondary | ICD-10-CM

## 2023-09-24 DIAGNOSIS — M25512 Pain in left shoulder: Secondary | ICD-10-CM

## 2023-09-24 DIAGNOSIS — J302 Other seasonal allergic rhinitis: Secondary | ICD-10-CM

## 2023-09-24 DIAGNOSIS — K449 Diaphragmatic hernia without obstruction or gangrene: Secondary | ICD-10-CM

## 2023-09-24 DIAGNOSIS — G8929 Other chronic pain: Secondary | ICD-10-CM

## 2023-09-24 DIAGNOSIS — N400 Enlarged prostate without lower urinary tract symptoms: Secondary | ICD-10-CM

## 2023-09-24 DIAGNOSIS — J4520 Mild intermittent asthma, uncomplicated: Secondary | ICD-10-CM

## 2023-09-24 DIAGNOSIS — Z7952 Long term (current) use of systemic steroids: Secondary | ICD-10-CM

## 2023-09-24 NOTE — Patient Instructions (Addendum)
 Vaccines You are taking a medication(s) that can suppress your immune system.  The following immunizations are recommended: Flu annually Covid-19  RSV Td/Tdap (tetanus, diphtheria, pertussis) every 10 years Pneumonia (Prevnar 15 then Pneumovax 23 at least 1 year apart.  Alternatively, can take Prevnar 20 without needing additional dose) Shingrix: 2 doses from 4 weeks to 6 months apart  Please check with your PCP to make sure you are up to date.   Shoulder Exercises Ask your health care provider which exercises are safe for you. Do exercises exactly as told by your health care provider and adjust them as directed. It is normal to feel mild stretching, pulling, tightness, or discomfort as you do these exercises. Stop right away if you feel sudden pain or your pain gets worse. Do not begin these exercises until told by your health care provider. Stretching exercises External rotation and abduction This exercise is sometimes called corner stretch. The exercise rotates your arm outward (external rotation) and moves your arm out from your body (abduction). Stand in a doorway with one of your feet slightly in front of the other. This is called a staggered stance. If you cannot reach your forearms to the door frame, stand facing a corner of a room. Choose one of the following positions as told by your health care provider: Place your hands and forearms on the door frame above your head. Place your hands and forearms on the door frame at the height of your head. Place your hands on the door frame at the height of your elbows. Slowly move your weight onto your front foot until you feel a stretch across your chest and in the front of your shoulders. Keep your head and chest upright and keep your abdominal muscles tight. Hold for __________ seconds. To release the stretch, shift your weight to your back foot. Repeat __________ times. Complete this exercise __________ times a day. Extension,  standing  Stand and hold a broomstick, a cane, or a similar object behind your back. Your hands should be a little wider than shoulder-width apart. Your palms should face away from your back. Keeping your elbows straight and your shoulder muscles relaxed, move the stick away from your body until you feel a stretch in your shoulders (extension). Avoid shrugging your shoulders while you move the stick. Keep your shoulder blades tucked down toward the middle of your back. Hold for __________ seconds. Slowly return to the starting position. Repeat __________ times. Complete this exercise __________ times a day. Range-of-motion exercises Pendulum  Stand near a wall or a surface that you can hold onto for balance. Bend at the waist and let your left / right arm hang straight down. Use your other arm to support you. Keep your back straight and do not lock your knees. Relax your left / right arm and shoulder muscles, and move your hips and your trunk so your left / right arm swings freely. Your arm should swing because of the motion of your body, not because you are using your arm or shoulder muscles. Keep moving your hips and trunk so your arm swings in the following directions, as told by your health care provider: Side to side. Forward and backward. In clockwise and counterclockwise circles. Continue each motion for __________ seconds, or for as long as told by your health care provider. Slowly return to the starting position. Repeat __________ times. Complete this exercise __________ times a day. Shoulder flexion, standing  Stand and hold a broomstick, a cane, or  a similar object. Place your hands a little more than shoulder-width apart on the object. Your left / right hand should be palm-up, and your other hand should be palm-down. Keep your elbow straight and your shoulder muscles relaxed. Push the stick up with your healthy arm to raise your left / right arm in front of your body, and then  over your head until you feel a stretch in your shoulder (flexion). Avoid shrugging your shoulder while you raise your arm. Keep your shoulder blade tucked down toward the middle of your back. Hold for __________ seconds. Slowly return to the starting position. Repeat __________ times. Complete this exercise __________ times a day. Shoulder abduction, standing  Stand and hold a broomstick, a cane, or a similar object. Place your hands a little more than shoulder-width apart on the object. Your left / right hand should be palm-up, and your other hand should be palm-down. Keep your elbow straight and your shoulder muscles relaxed. Push the object across your body toward your left / right side. Raise your left / right arm to the side of your body (abduction) until you feel a stretch in your shoulder. Do not raise your arm above shoulder height unless your health care provider tells you to do that. If directed, raise your arm over your head. Avoid shrugging your shoulder while you raise your arm. Keep your shoulder blade tucked down toward the middle of your back. Hold for __________ seconds. Slowly return to the starting position. Repeat __________ times. Complete this exercise __________ times a day. Internal rotation  Place your left / right hand behind your back, palm-up. Use your other hand to dangle an exercise band, a broomstick, or a similar object over your shoulder. Grasp the band with your left / right hand so you are holding on to both ends. Gently pull up on the band until you feel a stretch in the front of your left / right shoulder. The movement of your arm toward the center of your body is called internal rotation. Avoid shrugging your shoulder while you raise your arm. Keep your shoulder blade tucked down toward the middle of your back. Hold for __________ seconds. Release the stretch by letting go of the band and lowering your hands. Repeat __________ times. Complete this  exercise __________ times a day. Strengthening exercises External rotation  Sit in a stable chair without armrests. Secure an exercise band to a stable object at elbow height on your left / right side. Place a soft object, such as a folded towel or a small pillow, between your left / right upper arm and your body to move your elbow about 4 inches (10 cm) away from your side. Hold the end of the exercise band so it is tight and there is no slack. Keeping your elbow pressed against the soft object, slowly move your forearm out, away from your abdomen (external rotation). Keep your body steady so only your forearm moves. Hold for __________ seconds. Slowly return to the starting position. Repeat __________ times. Complete this exercise __________ times a day. Shoulder abduction  Sit in a stable chair without armrests, or stand up. Hold a __________ lb / kg weight in your left / right hand, or hold an exercise band with both hands. Start with your arms straight down and your left / right palm facing in, toward your body. Slowly lift your left / right hand out to your side (abduction). Do not lift your hand above shoulder height unless your  health care provider tells you that this is safe. Keep your arms straight. Avoid shrugging your shoulder while you do this movement. Keep your shoulder blade tucked down toward the middle of your back. Hold for __________ seconds. Slowly lower your arm, and return to the starting position. Repeat __________ times. Complete this exercise __________ times a day. Shoulder extension  Sit in a stable chair without armrests, or stand up. Secure an exercise band to a stable object in front of you so it is at shoulder height. Hold one end of the exercise band in each hand. Straighten your elbows and lift your hands up to shoulder height. Squeeze your shoulder blades together as you pull your hands down to the sides of your thighs (extension). Stop when your hands  are straight down by your sides. Do not let your hands go behind your body. Hold for __________ seconds. Slowly return to the starting position. Repeat __________ times. Complete this exercise __________ times a day. Shoulder row  Sit in a stable chair without armrests, or stand up. Secure an exercise band to a stable object in front of you so it is at chest height. Hold one end of the exercise band in each hand. Position your palms so that your thumbs are facing the ceiling (neutral position). Bend each of your elbows to a 90-degree angle (right angle) and keep your upper arms at your sides. Step back or move the chair back until the band is tight and there is no slack. Slowly pull your elbows back behind you. Hold for __________ seconds. Slowly return to the starting position. Repeat __________ times. Complete this exercise __________ times a day. Shoulder press-ups  Sit in a stable chair that has armrests. Sit upright, with your feet flat on the floor. Put your hands on the armrests so your elbows are bent and your fingers are pointing forward. Your hands should be about even with the sides of your body. Push down on the armrests and use your arms to lift yourself off the chair. Straighten your elbows and lift yourself up as much as you comfortably can. Move your shoulder blades down, and avoid letting your shoulders move up toward your ears. Keep your feet on the ground. As you get stronger, your feet should support less of your body weight as you lift yourself up. Hold for __________ seconds. Slowly lower yourself back into the chair. Repeat __________ times. Complete this exercise __________ times a day. Wall push-ups  Stand so you are facing a stable wall. Your feet should be about one arm-length away from the wall. Lean forward and place your palms on the wall at shoulder height. Keep your feet flat on the floor as you bend your elbows and lean forward toward the wall. Hold for  __________ seconds. Straighten your elbows to push yourself back to the starting position. Repeat __________ times. Complete this exercise __________ times a day. This information is not intended to replace advice given to you by your health care provider. Make sure you discuss any questions you have with your health care provider. Document Revised: 08/16/2021 Document Reviewed: 08/16/2021 Elsevier Patient Education  2024 [employer]   Low Back Sprain or Strain Rehab Ask your health care provider which exercises are safe for you. Do exercises exactly as told by your health care provider and adjust them as directed. It is normal to feel mild stretching, pulling, tightness, or discomfort as you do these exercises. Stop right away if you feel sudden pain  or your pain gets worse. Do not begin these exercises until told by your health care provider. Stretching and range-of-motion exercises These exercises warm up your muscles and joints and improve the movement and flexibility of your back. These exercises also help to relieve pain, numbness, and tingling. Lumbar rotation  Lie on your back on a firm bed or the floor with your knees bent. Straighten your arms out to your sides so each arm forms a 90-degree angle (right angle) with a side of your body. Slowly move (rotate) both of your knees to one side of your body until you feel a stretch in your lower back (lumbar). Try not to let your shoulders lift off the floor. Hold this position for __________ seconds. Tense your abdominal muscles and slowly move your knees back to the starting position. Repeat this exercise on the other side of your body. Repeat __________ times. Complete this exercise __________ times a day. Single knee to chest  Lie on your back on a firm bed or the floor with both legs straight. Bend one of your knees. Use your hands to move your knee up toward your chest until you feel a gentle stretch in your lower back and  buttock. Hold your leg in this position by holding on to the front of your knee. Keep your other leg as straight as possible. Hold this position for __________ seconds. Slowly return to the starting position. Repeat with your other leg. Repeat __________ times. Complete this exercise __________ times a day. Prone extension on elbows  Lie on your abdomen on a firm bed or the floor (prone position). Prop yourself up on your elbows. Use your arms to help lift your chest up until you feel a gentle stretch in your abdomen and your lower back. This will place some of your body weight on your elbows. If this is uncomfortable, try stacking pillows under your chest. Your hips should stay down, against the surface that you are lying on. Keep your hip and back muscles relaxed. Hold this position for __________ seconds. Slowly relax your upper body and return to the starting position. Repeat __________ times. Complete this exercise __________ times a day. Strengthening exercises These exercises build strength and endurance in your back. Endurance is the ability to use your muscles for a long time, even after they get tired. Pelvic tilt This exercise strengthens the muscles that lie deep in the abdomen. Lie on your back on a firm bed or the floor with your legs extended. Bend your knees so they are pointing toward the ceiling and your feet are flat on the floor. Tighten your lower abdominal muscles to press your lower back against the floor. This motion will tilt your pelvis so your tailbone points up toward the ceiling instead of pointing to your feet or the floor. To help with this exercise, you may place a small towel under your lower back and try to push your back into the towel. Hold this position for __________ seconds. Let your muscles relax completely before you repeat this exercise. Repeat __________ times. Complete this exercise __________ times a day. Alternating arm and leg raises  Get  on your hands and knees on a firm surface. If you are on a hard floor, you may want to use padding, such as an exercise mat, to cushion your knees. Line up your arms and legs. Your hands should be directly below your shoulders, and your knees should be directly below your hips. Lift your left leg  behind you. At the same time, raise your right arm and straighten it in front of you. Do not lift your leg higher than your hip. Do not lift your arm higher than your shoulder. Keep your abdominal and back muscles tight. Keep your hips facing the ground. Do not arch your back. Keep your balance carefully, and do not hold your breath. Hold this position for __________ seconds. Slowly return to the starting position. Repeat with your right leg and your left arm. Repeat __________ times. Complete this exercise __________ times a day. Abdominal set with straight leg raise  Lie on your back on a firm bed or the floor. Bend one of your knees and keep your other leg straight. Tense your abdominal muscles and lift your straight leg up, 4-6 inches (10-15 cm) off the ground. Keep your abdominal muscles tight and hold this position for __________ seconds. Do not hold your breath. Do not arch your back. Keep it flat against the ground. Keep your abdominal muscles tense as you slowly lower your leg back to the starting position. Repeat with your other leg. Repeat __________ times. Complete this exercise __________ times a day. Single leg lower with bent knees Lie on your back on a firm bed or the floor. Tense your abdominal muscles and lift your feet off the floor, one foot at a time, so your knees and hips are bent in 90-degree angles (right angles). Your knees should be over your hips and your lower legs should be parallel to the floor. Keeping your abdominal muscles tense and your knee bent, slowly lower one of your legs so your toe touches the ground. Lift your leg back up to return to the starting  position. Do not hold your breath. Do not let your back arch. Keep your back flat against the ground. Repeat with your other leg. Repeat __________ times. Complete this exercise __________ times a day. Posture and body mechanics Good posture and healthy body mechanics can help to relieve stress in your body's tissues and joints. Body mechanics refers to the movements and positions of your body while you do your daily activities. Posture is part of body mechanics. Good posture means: Your spine is in its natural S-curve position (neutral). Your shoulders are pulled back slightly. Your head is not tipped forward (neutral). Follow these guidelines to improve your posture and body mechanics in your everyday activities. Standing  When standing, keep your spine neutral and your feet about hip-width apart. Keep a slight bend in your knees. Your ears, shoulders, and hips should line up. When you do a task in which you stand in one place for a long time, place one foot up on a stable object that is 2-4 inches (5-10 cm) high, such as a footstool. This helps keep your spine neutral. Sitting  When sitting, keep your spine neutral and keep your feet flat on the floor. Use a footrest, if necessary, and keep your thighs parallel to the floor. Avoid rounding your shoulders, and avoid tilting your head forward. When working at a desk or a computer, keep your desk at a height where your hands are slightly lower than your elbows. Slide your chair under your desk so you are close enough to maintain good posture. When working at a computer, place your monitor at a height where you are looking straight ahead and you do not have to tilt your head forward or downward to look at the screen. Resting When lying down and resting, avoid positions that  are most painful for you. If you have pain with activities such as sitting, bending, stooping, or squatting, lie in a position in which your body does not bend very much. For  example, avoid curling up on your side with your arms and knees near your chest (fetal position). If you have pain with activities such as standing for a long time or reaching with your arms, lie with your spine in a neutral position and bend your knees slightly. Try the following positions: Lying on your side with a pillow between your knees. Lying on your back with a pillow under your knees. Lifting  When lifting objects, keep your feet at least shoulder-width apart and tighten your abdominal muscles. Bend your knees and hips and keep your spine neutral. It is important to lift using the strength of your legs, not your back. Do not lock your knees straight out. Always ask for help to lift heavy or awkward objects. This information is not intended to replace advice given to you by your health care provider. Make sure you discuss any questions you have with your health care provider. Document Revised: 10/30/2022 Document Reviewed: 09/13/2020 Elsevier Patient Education  2024 [employer].

## 2023-09-26 ENCOUNTER — Telehealth: Payer: Self-pay | Admitting: Internal Medicine

## 2023-09-26 NOTE — Telephone Encounter (Signed)
 Pt calling about a bill from [employer]. Last time he saw Dr. Marchelle Gearing  Dr. Catalina Pizza him he would send them ([facility]) added codes and Dr. Eulah Citizen resubmit so billing could be corrected. To date [facility] is still billing the PT. Please call PT to advise action taken. Thanks [phone]   Annette Stable # is [id number]  He knows that Dr. Elvera Lennox has indeed submit the new codes. It was done in his office while PT was here but something happened.

## 2023-09-27 NOTE — Telephone Encounter (Signed)
 I called [facility] billing dept at [phone] and the office was currently closed  Will try back later today    I spoke with the pt and he is aware that we are working on this and will give him update once I have one.

## 2023-09-28 ENCOUNTER — Encounter: Payer: Self-pay | Admitting: *Deleted

## 2023-09-28 ENCOUNTER — Telehealth: Payer: Self-pay | Admitting: Internal Medicine

## 2023-09-28 NOTE — Telephone Encounter (Signed)
 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 He would like to pick it up. Please call to advise when ready @ [phone]

## 2023-09-28 NOTE — Telephone Encounter (Signed)
 Called [facility]- wait time >10 min  I left my number to call back  Will await call

## 2023-09-28 NOTE — Telephone Encounter (Signed)
 MR- are you okay to give jury excuse? If so, I will prepare and have you sign 3/24

## 2023-09-28 NOTE — Telephone Encounter (Signed)
    [employer] Selection [employer]   Re: DEVUN ANNA with date of birth 07-02-1966 and address: [address] [facility] [place] [address]   The above named person is a patient of mine in the pulmonary clinic.  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  He is in no way shape or form to do jury duty onsite or virtual and put his health at risk.  Jury duty will put his health at significant risk.  Therefore he should not ever do jury duty   Please do not hesitate to contact me if you have any questions    Sincerelyours     SIGNATURE    Dr. Kalman Shan, M.D., F.C.C.P,  Pulmonary and Critical Care Medicine Staff Physician, [facility] Director - Interstitial Lung Disease  Program  Pulmonary Fibrosis [facility] [place], [place], [address]   Pager: [phone], If no answer  -> Check AMION or Try [phone] Telephone (clinical office): [phone] Telephone (research): [phone]  1:56 PM 09/28/2023

## 2023-09-28 NOTE — Telephone Encounter (Signed)
 Spoke with Greig Castilla at [facility]- submitted the following codes:  2. Sjogren syndrome with lung involvement (HCC)  M35.02       3. High risk medication use  Z79.899       4. Immunosuppressed status (HCC)  D84.9       5. ANA positive  R76.8       6. Dry mouth  R68.2       7. Lymphocytosis  D72.820       8. Aneurysm of ascending aorta without rupture (HCC)  I71.21       9. Vitamin D deficiency  E55.9        Was advised that pt may now disregard his bill, as these are all acceptable codes I sent the pt a mychart msg to let him know  Nothing further needed

## 2023-10-01 ENCOUNTER — Encounter: Payer: Self-pay | Admitting: *Deleted

## 2023-10-01 NOTE — Telephone Encounter (Signed)
 Jury letter done  Pt made aware via [employer]

## 2023-10-16 NOTE — Telephone Encounter (Signed)
 Joseph Hendricks I told you a few weeks ago that I updated his note from that particular visit to the appropriate diagnostic codes and I think when insurance request call you you gave them the codes.  Please follow-up with the family and find out if everything is sorted out

## 2023-10-17 NOTE — Telephone Encounter (Signed)
 See mychart msg from 09/28/23- this was handled

## 2023-10-22 ENCOUNTER — Ambulatory Visit
Admission: RE | Admit: 2023-10-22 | Discharge: 2023-10-22 | Disposition: A | Discharge status: Home / Self Care | Payer: 59 | Source: Ambulatory Visit | Attending: Internal Medicine | Admitting: Internal Medicine

## 2023-10-22 DIAGNOSIS — M3502 Sicca syndrome with lung involvement: Secondary | ICD-10-CM

## 2023-10-22 DIAGNOSIS — J849 Interstitial pulmonary disease, unspecified: Secondary | ICD-10-CM

## 2023-10-22 DIAGNOSIS — Z79899 Other long term (current) drug therapy: Secondary | ICD-10-CM

## 2023-10-22 DIAGNOSIS — Z5181 Encounter for therapeutic drug level monitoring: Secondary | ICD-10-CM

## 2023-10-22 DIAGNOSIS — Z2989 Encounter for other specified prophylactic measures: Secondary | ICD-10-CM

## 2023-11-22 ENCOUNTER — Ambulatory Visit: Attending: Cardiovascular Disease | Admitting: Cardiovascular Disease

## 2023-11-22 ENCOUNTER — Encounter: Payer: Self-pay | Admitting: Cardiovascular Disease

## 2023-11-22 VITALS — BP 118/84 | HR 86 | Ht 70.0 in | Wt 193.0 lb

## 2023-11-22 DIAGNOSIS — I2510 Atherosclerotic heart disease of native coronary artery without angina pectoris: Secondary | ICD-10-CM

## 2023-11-22 DIAGNOSIS — I7121 Aneurysm of the ascending aorta, without rupture: Secondary | ICD-10-CM | POA: Diagnosis not present

## 2023-11-22 DIAGNOSIS — Z7689 Persons encountering health services in other specified circumstances: Secondary | ICD-10-CM | POA: Diagnosis not present

## 2023-11-22 NOTE — Progress Notes (Signed)
 Chief Complaint  Patient presents with   New Patient (Initial Visit)    Hyperlipidemia    History of Present Illness: 58 yo male with history of Sjogren's syndrome with interstitial lung disease, GERD, HLD, Coronary artery calcification and mild dilation of the ascending aorta who is here today as a new patient, self referral, for the evaluation of CAD noted on CT. He had a chest CT on 11/02/23 that showed coronary artery calcification and mildly dilated aortic root. He is followed in the pulmonary office for ILD. He is on Cellcept . He has dyspnea with heavy exertion but no chest pain, dizziness or LE edema.   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   right forearm, left wrist, calf   Tuberculosis    patient denies this dx as of 11/03/22    Past Surgical History:  Procedure Laterality Date   APPENDECTOMY     BRONCHIAL BIOPSY  11/06/2022   Procedure: BRONCHIAL BIOPSIES;  Surgeon: Denson Flake, MD;  Location: [facility] ENDOSCOPY;  Service: Pulmonary;;   BRONCHIAL WASHINGS  11/06/2022   Procedure: BRONCHIAL WASHINGS;  Surgeon: Denson Flake, MD;  Location: [facility] ENDOSCOPY;  Service: Pulmonary;;   COLONOSCOPY     HERNIA REPAIR     KNEE SURGERY Left    SHOULDER SURGERY Right    SQUAMOUS CELL CARCINOMA EXCISION     x3 or 4 per patient, bilateral arms, back and leg   VIDEO BRONCHOSCOPY Bilateral 11/06/2022   Procedure: VIDEO BRONCHOSCOPY WITH FLUORO;  Surgeon: Denson Flake, MD;  Location: [facility] ENDOSCOPY;  Service:  Pulmonary;  Laterality: Bilateral;   WISDOM TOOTH EXTRACTION      Current Outpatient Medications  Medication Sig Dispense Refill   azelastine  (ASTELIN ) 0.1 % nasal spray Place 2 sprays into both nostrils in the morning. Use in each nostril as directed 30 mL 1   Cholecalciferol (VITAMIN D3 PO) Take 1 tablet by mouth at bedtime.     Doxylamine Succinate, Sleep, (UNISOM PO) Take by mouth at bedtime.     fluticasone  (FLONASE ) 50 MCG/ACT nasal spray Place 2 sprays into both nostrils in the morning and at bedtime. (Patient taking differently: Place 2 sprays into both nostrils as needed.)     lovastatin  (MEVACOR ) 20 MG tablet TAKE 1 TABLET BY MOUTH AT BEDTIME 90 tablet 3   Multiple Vitamins-Minerals (MENS MULTIVITAMIN PLUS) TABS Take 1 tablet by mouth at bedtime.     mycophenolate  (CELLCEPT ) 500 MG tablet Take 2 tablets (1,000 mg total) by mouth 2 (two) times daily. 360 tablet 1   sulfamethoxazole -trimethoprim  (BACTRIM  DS) 800-160 MG tablet Take one tablet by mouth on Mondays, Wednesdays, and Fridays only. 36 tablet 2   No current facility-administered medications for this visit.    No Known Allergies  Social History   Socioeconomic History   Marital status: Married    Spouse name: Not on file   Number of children: 3  Years of education: Not on file   Highest education level: Not on file  Occupational History   Occupation: CFO  Tobacco Use   Smoking status: Never    Passive exposure: Never   Smokeless tobacco: Never  Vaping Use   Vaping status: Never Used  Substance and Sexual Activity   Alcohol use: Yes    Comment: occ   Drug use: No   Sexual activity: Yes  Other Topics Concern   Not on file  Social History Narrative   Not on file   Social Drivers of Health   Financial Resource Strain: Not on file  Food Insecurity: Not on file  Transportation Needs: Not on file  Physical Activity: Not on file  Stress: Not on file  Social Connections: Not on file  Intimate Partner  Violence: Not on file    Family History  Problem Relation Age of Onset   Arthritis Mother        osteo   Factor V Leiden deficiency Mother    Diabetes Father    Alzheimer's disease Father    Healthy Sister    Allergies Brother    Cancer Brother        testicular   Healthy Brother    Healthy Son    Healthy Daughter    Healthy Daughter     Review of Systems:  As stated in the HPI and otherwise negative.   BP 118/84   Pulse 86   Ht 5\' 10"  (1.778 m)   Wt 193 lb (87.5 kg)   SpO2 97%   BMI 27.69 kg/m   Physical Examination: General: Well developed, well nourished, NAD  HEENT: OP clear, mucus membranes moist  SKIN: warm, dry. No rashes. Neuro: No focal deficits  Musculoskeletal: Muscle strength 5/5 all ext  Psychiatric: Mood and affect normal  Neck: No JVD, no carotid bruits, no thyromegaly, no lymphadenopathy.  Lungs:Clear bilaterally, no wheezes, rhonci, crackles Cardiovascular: Regular rate and rhythm. No murmurs, gallops or rubs. Abdomen:Soft. Bowel sounds present. Non-tender.  Extremities: No lower extremity edema. Pulses are 2 + in the bilateral DP/PT.  EKG:  EKG is ordered today. The ekg ordered today demonstrates  EKG Interpretation Date/Time:  Thursday Nov 22 2023 15:27:40 EDT Ventricular Rate:  79 PR Interval:  148 QRS Duration:  88 QT Interval:  350 QTC Calculation: 401 R Axis:   67  Text Interpretation: Normal sinus rhythm Normal ECG Confirmed by Antoinette Batman [phone]) on 11/22/2023 3:50:17 PM    Recent Labs: 08/23/2023: ALT 21; BUN 12; Creatinine, Ser 0.85; Hemoglobin 15.1; Platelets 241.0; Potassium 4.3; Sodium 139   Lipid Panel    Component Value Date/Time   CHOL 194 05/06/2021 0728   TRIG 101 05/06/2021 0728   HDL 53 05/06/2021 0728   CHOLHDL 3.7 05/06/2021 0728   LDLCALC 123 (H) 05/06/2021 0728     Wt Readings from Last 3 Encounters:  11/22/23 193 lb (87.5 kg)  09/24/23 196 lb 6.4 oz (89.1 kg)  08/23/23 194 lb 3.2 oz (88.1 kg)     Assessment and Plan:   1. CAD without angina: Coronary artery calcification on recent chest CT. No chest pain. Will have him start ASA 81 mg daily. He is on Mevacor . Will check lipids and LFTS today. If LDL is over 70, will change Mevacor  to Crestor. Echo to assess LV function.   2. Thoracic aortic aneurysm: 4.0 cm by chest CT April 2025. Repeat CT in one year per protocol for his lung disease.  Labs/ tests ordered today include:   Orders Placed This Encounter  Procedures   Lipid panel   Hepatic function panel   EKG 12-Lead   ECHOCARDIOGRAM COMPLETE    Disposition:   F/U with me in one year  Signed, Antoinette Batman, MD, [facility] 11/22/2023 4:11 PM    [facility] [address] [place], [place], [place]  [address] Phone: [phone]; Fax: [phone]

## 2023-11-22 NOTE — Patient Instructions (Addendum)
 Medication Instructions:  No changes *If you need a refill on your cardiac medications before your next appointment, please call your pharmacy*  Lab Work: Lipids, liver function - come in to the lab on first floor for this.  No appointment needed.   Testing/Procedures: Your physician has requested that you have an echocardiogram. Echocardiography is a painless test that uses sound waves to create images of your heart. It provides your doctor with information about the size and shape of your heart and how well your heart's chambers and valves are working. This procedure takes approximately one hour. There are no restrictions for this procedure. Please do NOT wear cologne, perfume, aftershave, or lotions (deodorant is allowed). Please arrive 15 minutes prior to your appointment time.  Please note: We ask at that you not bring children with you during ultrasound (echo/ vascular) testing. Due to room size and safety concerns, children are not allowed in the ultrasound rooms during exams. Our front office staff cannot provide observation of children in our lobby area while testing is being conducted. An adult accompanying a patient to their appointment will only be allowed in the ultrasound room at the discretion of the ultrasound technician under special circumstances. We apologize for any inconvenience.   Follow-Up: At [facility], you and your health needs are our priority.  As part of our continuing mission to provide you with exceptional heart care, our providers are all part of one team.  This team includes your primary Cardiologist (physician) and Advanced Practice Providers or APPs (Physician Assistants and Nurse Practitioners) who all work together to provide you with the care you need, when you need it.  Your next appointment:   12 month(s)  Provider:   Antoinette Batman, MD

## 2023-11-28 ENCOUNTER — Other Ambulatory Visit: Payer: Self-pay

## 2023-11-28 DIAGNOSIS — I2510 Atherosclerotic heart disease of native coronary artery without angina pectoris: Secondary | ICD-10-CM

## 2023-11-29 ENCOUNTER — Ambulatory Visit: Payer: Self-pay | Admitting: Cardiovascular Disease

## 2023-11-29 DIAGNOSIS — Z79899 Other long term (current) drug therapy: Secondary | ICD-10-CM

## 2023-11-29 DIAGNOSIS — E782 Mixed hyperlipidemia: Secondary | ICD-10-CM

## 2023-11-29 LAB — LIPID PANEL
Chol/HDL Ratio: 3.6 ratio (ref 0.0–5.0)
Cholesterol, Total: 192 mg/dL (ref 100–199)
HDL: 53 mg/dL (ref >39)
LDL Chol Calc (NIH): 122 mg/dL — ABNORMAL HIGH (ref 0–99)
Triglycerides: 94 mg/dL (ref 0–149)
VLDL Cholesterol Cal: 17 mg/dL (ref 5–40)

## 2023-11-29 LAB — HEPATIC FUNCTION PANEL
ALT: 16 IU/L (ref 0–44)
AST: 16 IU/L (ref 0–40)
Albumin: 4.6 g/dL (ref 3.8–4.9)
Alkaline Phosphatase: 55 IU/L (ref 44–121)
Bilirubin Total: 0.9 mg/dL (ref 0.0–1.2)
Bilirubin, Direct: 0.28 mg/dL (ref 0.00–0.40)
Total Protein: 7.1 g/dL (ref 6.0–8.5)

## 2023-12-07 ENCOUNTER — Other Ambulatory Visit: Payer: Self-pay

## 2023-12-07 DIAGNOSIS — E782 Mixed hyperlipidemia: Secondary | ICD-10-CM

## 2023-12-07 DIAGNOSIS — Z79899 Other long term (current) drug therapy: Secondary | ICD-10-CM

## 2023-12-07 MED ORDER — ROSUVASTATIN CALCIUM 10 MG PO TABS: 10.0000 mg | ORAL_TABLET | Freq: Every day | ORAL | 3 refills | Status: AC

## 2023-12-07 NOTE — Telephone Encounter (Signed)
-----   Message from Antoinette Batman sent at 11/29/2023  9:36 AM EDT ----- LDL 122. Would stop Mevacor  and start Crestor 10 mg daily. Lipids and LFTs in 12 weeks. cdm

## 2023-12-07 NOTE — Telephone Encounter (Signed)
 Rosuvastatin sent to pharmacy on file. Labs entered and released so pt may have drawn in 3 months. MAR updated to discontinue Lovastatin .

## 2023-12-07 NOTE — Telephone Encounter (Signed)
 Santana Cue Locastro "[name]" to [employer], RN      12/06/23  3:31 PM Hi Michalene,   Please use the [facility] on [address] in [place].   Thanks! Gale Jude, RN    12/06/23  2:14 PM Result Note Results released to MyChart patient portal. Lipid panel; Hepatic function panel Lowell Rude, RN to Santana Cue Hopkin "[name]"     12/06/23  2:14 PM Jetty Mort,  Dr. Abel Hoe reviewed the blood work.  Liver function is normal.  LDL (the bad cholesterol) is above goal at 122.  He recommends to stop Mevacor  and start a different statin --rosuvastatin (Crestor) 10 mg - one tablet daily.  Then we will plan blood work again in about 12 weeks.  Let me know which pharmacy you would like the rosuvastatin sent to.  Michalene, RN  Last read by Honey Lusty "[name]" at 3:29PM on 12/06/2023. Hepatic function panel; Lipid panel

## 2024-01-04 ENCOUNTER — Ambulatory Visit (HOSPITAL_COMMUNITY)
Admission: RE | Admit: 2024-01-04 | Discharge: 2024-01-04 | Disposition: A | Discharge status: Home / Self Care | Source: Ambulatory Visit | Attending: Cardiology | Admitting: Cardiology

## 2024-01-04 DIAGNOSIS — I2510 Atherosclerotic heart disease of native coronary artery without angina pectoris: Secondary | ICD-10-CM | POA: Diagnosis present

## 2024-01-04 LAB — ECHOCARDIOGRAM COMPLETE
Area-P 1/2: 3.39 cm2
S' Lateral: 3.3 cm

## 2024-01-08 ENCOUNTER — Encounter: Payer: Self-pay | Admitting: Internal Medicine

## 2024-01-08 ENCOUNTER — Telehealth: Payer: Self-pay | Admitting: Internal Medicine

## 2024-01-08 ENCOUNTER — Ambulatory Visit: Admitting: Internal Medicine

## 2024-01-08 VITALS — BP 104/60 | HR 72 | Ht 70.0 in | Wt 193.0 lb

## 2024-01-08 DIAGNOSIS — M359 Systemic involvement of connective tissue, unspecified: Secondary | ICD-10-CM | POA: Diagnosis not present

## 2024-01-08 DIAGNOSIS — Z2989 Encounter for other specified prophylactic measures: Secondary | ICD-10-CM

## 2024-01-08 DIAGNOSIS — Z79899 Other long term (current) drug therapy: Secondary | ICD-10-CM

## 2024-01-08 DIAGNOSIS — Z5181 Encounter for therapeutic drug level monitoring: Secondary | ICD-10-CM | POA: Diagnosis not present

## 2024-01-08 DIAGNOSIS — J849 Interstitial pulmonary disease, unspecified: Secondary | ICD-10-CM | POA: Diagnosis not present

## 2024-01-08 DIAGNOSIS — J8489 Other specified interstitial pulmonary diseases: Secondary | ICD-10-CM | POA: Diagnosis not present

## 2024-01-08 DIAGNOSIS — M3502 Sicca syndrome with lung involvement: Secondary | ICD-10-CM

## 2024-01-08 LAB — CBC WITH DIFFERENTIAL/PLATELET
Basophils Absolute: 0 10*3/uL (ref 0.0–0.1)
Basophils Relative: 0.6 % (ref 0.0–3.0)
Eosinophils Absolute: 0 10*3/uL (ref 0.0–0.7)
Eosinophils Relative: 1.1 % (ref 0.0–5.0)
HCT: 43.4 % (ref 39.0–52.0)
Hemoglobin: 14.8 g/dL (ref 13.0–17.0)
Lymphocytes Relative: 19.5 % (ref 12.0–46.0)
Lymphs Abs: 0.8 10*3/uL (ref 0.7–4.0)
MCHC: 34.2 g/dL (ref 30.0–36.0)
MCV: 91.4 fl (ref 78.0–100.0)
Monocytes Absolute: 0.5 10*3/uL (ref 0.1–1.0)
Monocytes Relative: 11 % (ref 3.0–12.0)
Neutro Abs: 2.9 10*3/uL (ref 1.4–7.7)
Neutrophils Relative %: 67.8 % (ref 43.0–77.0)
Platelets: 212 10*3/uL (ref 150.0–400.0)
RBC: 4.74 Mil/uL (ref 4.22–5.81)
RDW: 13.6 % (ref 11.5–15.5)
WBC: 4.3 10*3/uL (ref 4.0–10.5)

## 2024-01-08 LAB — PULMONARY FUNCTION TEST
DL/VA % pred: 97 %
DL/VA: 4.16 ml/min/mmHg/L
DLCO unc % pred: 80 %
DLCO unc: 22.27 ml/min/mmHg
FEF 25-75 Pre: 3 L/s
FEF2575-%Pred-Pre: 99 %
FEV1-%Pred-Pre: 89 %
FEV1-Pre: 3.25 L
FEV1FVC-%Pred-Pre: 104 %
FEV6-%Pred-Pre: 88 %
FEV6-Pre: 4.06 L
FEV6FVC-%Pred-Pre: 103 %
FVC-%Pred-Pre: 85 %
FVC-Pre: 4.11 L
Pre FEV1/FVC ratio: 79 %
Pre FEV6/FVC Ratio: 99 %

## 2024-01-08 LAB — HEPATIC FUNCTION PANEL
ALT: 21 U/L (ref 0–53)
AST: 18 U/L (ref 0–37)
Albumin: 4.5 g/dL (ref 3.5–5.2)
Alkaline Phosphatase: 43 U/L (ref 39–117)
Bilirubin, Direct: 0.2 mg/dL (ref 0.0–0.3)
Total Bilirubin: 1 mg/dL (ref 0.2–1.2)
Total Protein: 7.6 g/dL (ref 6.0–8.3)

## 2024-01-08 LAB — BASIC METABOLIC PANEL WITH GFR
BUN: 12 mg/dL (ref 6–23)
CO2: 24 meq/L (ref 19–32)
Calcium: 9.3 mg/dL (ref 8.4–10.5)
Chloride: 106 meq/L (ref 96–112)
Creatinine, Ser: 0.85 mg/dL (ref 0.40–1.50)
GFR: 95.06 mL/min (ref >60.00)
Glucose, Bld: 101 mg/dL — ABNORMAL HIGH (ref 70–99)
Potassium: 4.1 meq/L (ref 3.5–5.1)
Sodium: 138 meq/L (ref 135–145)

## 2024-01-08 NOTE — Telephone Encounter (Signed)
 Great news-Thank you for the update.  I will discuss options at his upcoming visit.  Joesphine

## 2024-01-08 NOTE — Patient Instructions (Addendum)
 ICD-10-CM   1. Interstitial lung disease due to connective tissue disease (HCC)  J84.89 CBC w/Diff   M35.9 Basic Metabolic Panel (BMET)    Hepatic function panel    2. High risk medication use  Z79.899 CBC w/Diff    Basic Metabolic Panel (BMET)    Hepatic function panel    3. 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  Pulmonary function test is actually improved.    Symptoms improved. CT is much improved in April 2025  PLAN -  At this point in time I do not see a need for  prednisone  OR ANTIFIBROTIC -Continue Bactrim  DS 1 tablet PO Mondays, Wednesdays, Fridays for PJP prophylaxis. -Continue CellCept  1000 mg twice daily  - but talk to Cheryl Birmingham as to how long to do this medicine given HUGE IMPROVEMENT  - sent message -Check CBC, chemistry, liver function 01/08/2024  - Continue daily aerobic exercise but can include gentle weight training - consider STRETCH ZOE -Do spirometry and DLCO in 6 months    Follow-up - 25-month ; 15-minute visit but after spirometry and DLCO.

## 2024-01-08 NOTE — Telephone Encounter (Signed)
 Cheryl Evalene RAMAN Fetty  = I think at this point is completed almost a year of CellCept .  His CT scan shows dramatic improvement pulmonary function test shows improvement.  He is feeling great.  He is on CellCept  1000 mg twice daily.  I do not know what the endpoint and duration of treating him with immunomodulators this.  He is seeing you in August 2025 and I recommended that he have this conversation with you.  On one hand given the improvement we could stop or reduce the dose but on the other hand his autoimmune disease could come roaring back    Thanks    SIGNATURE    Dr. Dorethia Cave, M.D., F.C.C.P,  Pulmonary and Critical Care Medicine Staff Physician, [facility] Director - Interstitial Lung Disease  Program  Pulmonary Fibrosis [facility] at [facility] [place], [place], [address]   Pager: [phone], If no answer  -> Check AMION or Try [phone] Telephone (clinical office): [phone] Telephone (research): [phone]  9:38 AM 01/08/2024

## 2024-01-08 NOTE — Progress Notes (Signed)
Spiro/DLCO performed today. 

## 2024-01-08 NOTE — Progress Notes (Signed)
 OV 12/19/2022 -transfer of care to see Dr. Geronimo in the interstitial lung disease center.  History is gained from talking to the patient review of the records and also the ILD questionnaire.  Subjective:  Patient ID: Joseph Hendricks, male , DOB: 1966/04/02 , age 58 y.o. , MRN: [id number] , ADDRESS: [address] [place] [address] PCP Joseph Charlie CROME, MD Patient Care Team: Joseph Charlie CROME, MD as PCP - General (Family Medicine)  This Provider for this visit: Treatment Team:  Attending Provider: Geronimo Amel, MD    12/19/2022 -   Chief Complaint  Patient presents with   Consult    Consult for possible ILD, CT scan, and bronch results.     HPI Joseph Hendricks 58 y.o. 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  He is related to Dr. Marcey Hendricks the surgeon [his brother-in-law].  He tells me that he has been previously well but in January 2024 all of a sudden he started with a cough that kept insidiously.  He is had 2 rounds of antibiotics and prednisone  initially diagnosed also with pneumonia.  He felt he was really bad at the time and could not work.  The cough would get worse with sneezing and yawning.  He thought he might of had COVID at this time in January but no COVID test has been positive.  He subsequently saw Dr. Lamar Hendricks who controlled his cough with the help of acid reflux treatment.  He also subjective patient other treatment measures.  The cough gradually got away.  He has been stable to walk right now but he still gets fatigued and short of breath.  He says when he goes for his 2 mile walk 4 times a week he has to pause at the top.  This is not his baseline although it is better compared to earlier in the year when he was coughing significantly.  He says through this workup with Dr. Chris interstitial lung disease was diagnosed.  Then subsequently in April 2024 underwent bronchoscopy with lavage that shows 55%  lymphocytes.  Which means BAL with lymphocytosis.  His autoimmune antibody panel is positive for SSA SSB and related myositis antibody.  His ANA is also trace positive.  At 1: 80 showing cytoplasmic pattern.  Repeat ANA on the same day shows 1: 640 with a nuclear speckled pattern.  His hypersensitive pneumonitis antibody panel is normal.  The CT scan he underwent for evaluation of the cough is a super D CT scan.  I personally visualized it.  I agree it is not consistent with UIP.  I personally thought it look like NSIP.  However Dr. Bonnetta feels that is consistent with organizing pneumonia but we all agree that alternative pattern.  He is awaiting rheumatology consultation but is not until October 2024.  Secure chatted DR Joseph Hendricks who upon review of the chart feels patient's features are all consistent with Sjogren's syndrome.  [He does have a history of dry mouth].  Detail ILD question as below    [place] [facility] Comprehensive ILD Questionnaire  Symptoms:    Past Medical History :  -He has history of exercise-induced asthma for a few decades - Prior connective tissue diagnosis diagnosis is negative.  Although he is aware of his recent antibody panel positivity. - He has had COVID-vaccine but not formally the COVID disease although at the onset of the illness he thought he may have had COVID  ROS:  -Positive for fatigue for the last several months - Positive arthralgia for the last few years particularly in the fingers - She does have an occasional heartburn [previously had significant heartburn and he lost 25 pounds and his heartburn resolved].  He does occasionally snore -He does have dry mouth for years.  Is particularly worse in the morning.  But no dry hands  FAMILY HISTORY of LUNG DISEASE:  -Asthma in her brother but otherwise negative for family history of lung disease  PERSONAL EXPOSURE HISTORY:  -Smoked marijuana few x 35 to 40 years ago but otherwise no current marijuana  use no tobacco use no no cocaine use no intravenous drug use  HOME  EXPOSURE and HOBBY DETAILS :  -[place]  Home was built in 1989.  He has lived there past 21 years.  Detail organic antigen exposure history in the house is negative.  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  All corporate finance related.  Detail organic and inorganic antigen exposure history at work or else was negative.  PULMONARY TOXICITY HISTORY (27 items):  -Other than short course of prednisone  he has not had any drugs that can cause pulmonary toxicity.  INVESTIGATIONS: As below    SUPER D CT 10/18/22 - personally visualzied, interpreted but agree with findings below -per email communication with Dr. Bonnetta for second opinionm  This looks like organizing PNA to me.  Sjgren syndrome can present with an OP pattern.  Could also be due to some other insult like a drug reaction.    arrative & Impression  CLINICAL DATA:  Lung nodule. History of pneumonia with abnormal pulmonary function tests.   EXAM: CT CHEST WITHOUT CONTRAST   TECHNIQUE: Multidetector CT imaging of the chest was performed using thin slice collimation for electromagnetic bronchoscopy planning purposes, without intravenous contrast.   RADIATION DOSE REDUCTION: This exam was performed according to the departmental dose-optimization program which includes automated exposure control, adjustment of the mA and/or kV according to patient size and/or use of iterative reconstruction technique.   COMPARISON:  None Available.   FINDINGS: Cardiovascular: 07/31/2012.   Mediastinum/Nodes: Atherosclerotic calcification of the aorta. Ascending aorta measures 4.3 cm (5/51). Comparison with 07/31/2012 is challenging in the absence of coronal reformatted images. Heart is enlarged. No pericardial effusion.   Lungs/Pleura: No pathologically enlarged mediastinal or  axillary lymph nodes. Hilar regions are difficult to definitively evaluate without IV contrast. Esophagus is grossly unremarkable.   Upper Abdomen: Patchy peripheral and basilar coarsened ground-glass and slight consolidation with traction bronchiectasis, new from 07/31/2012. No pleural fluid. Airway is unremarkable.   Musculoskeletal: Visualized portions of the liver, gallbladder adrenal glands are unremarkable. Low-attenuation lesion in the right kidney. No specific follow-up necessary. Visualized portions of the kidneys, spleen, pancreas, stomach and bowel are otherwise grossly unremarkable. No upper abdominal adenopathy.   IMPRESSION: No worrisome lytic or sclerotic lesions.   1. Pulmonary parenchymal pattern of peripheral and basilar coarsened ground-glass, slight consolidation and traction bronchiectasis, new from 07/31/2012 and likely due to the sequelae of COVID-19 pneumonia. Findings are suggestive of an alternative diagnosis (not UIP) per consensus guidelines: Diagnosis of Idiopathic Pulmonary Fibrosis: An Official ATS/ERS/JRS/ALAT Clinical Practice Guideline. Am JINNY [employer] Crit Care Med Vol 198, Iss 5, [id number], Mar 10 2017. 2. 4.3 cm ascending aortic aneurysm. Recommend annual imaging followup by CTA or MRA. This recommendation follows 2010 ACCF/AHA/AATS/ACR/ASA/SCA/SCAI/SIR/STS/SVM Guidelines for the Diagnosis and Management of Patients with Thoracic  Aortic Disease. Circulation. 2010; 121: [id number]. Aortic aneurysm NOS (ICD10-I71.9). 3.  Aortic atherosclerosis (ICD10-I70.0).     Electronically Signed   By: Newell Eke M.D.   On: 10/19/2022 13:41     Latest Reference Range & Units 10/27/22 11:31  Anti-Jo-1 Ab (RDL) <20 Units <20  Anti-PL-7 Ab (RDL) Negative  Negative  Anti-PL-12 Ab (RDL) Negative  Negative  Anti-EJ Ab (RDL) Negative  Negative  Anti-OJ Ab (RDL) Negative  Negative  Anti-SRP Ab (RDL) Negative  Negative  Anti-Mi-2 Ab (RDL) Negative  Negative   Jwup-UPQ-8hjffj Ab (RDL) <20 Units <20  Anti-MDA-5 Ab (CADM-140)(RDL) <20 Units <20  Anti-NXP-2 (P140) Ab (RDL) <20 Units <20  Anti-SAE1 Ab, IgG (RDL) <20 Units <20  Anti-PM/Scl-100 Ab (RDL) <20 Units <20  Anti-Ku Ab (RDL) Negative  Weak Positive !  Anti-SS-A 52kD Ab, IgG (RDL) <20 Units >200 (H)  Anti-U1 RNP Ab (RDL) <20 Units <20  Anti-U2 RNP Ab (RDL) Negative  Negative  Anti-U3 RNP (Fibrillarin)(RDL) Negative  Negative  !: Data is abnormal (H): Data is abnormally high    Latest Reference Range & Units 05/06/21 07:28 10/27/22 11:30  ENA RNP Ab 0.0 - 0.9 AI  <0.2  SSA (Ro) (ENA) Antibody, IgG <1.0 NEG AI  >8.0 POS !  SSB (La) (ENA) Antibody, IgG <1.0 NEG AI  1.2 POS !  Scleroderma (Scl-70) (ENA) Antibody, IgG <1.0 NEG AI  <1.0 NEG  Prostate Specific Ag, Serum 0.0 - 4.0 ng/mL 0.4   !: Data is abnormal    Latest Reference Range & Units 10/27/22 11:30  Anti Nuclear Antibody (ANA) NEGATIVE  POSITIVE !  ANA Pattern 1  Cytoplasmic !  ANA Titer 1 titer 1:80 (H)  ANCA SCREEN Negative  Negative  Cyclic Citrullin Peptide Ab UNITS <16  ds DNA Ab IU/mL 2  ENA RNP Ab 0.0 - 0.9 AI <0.2  RA Latex Turbid. <14 IU/mL 12  !: Data is abnormal (H): Data is abnormally high    Latest Reference Range & Units 06/10/14 00:00 05/14/15 07:44 04/14/16 07:45 06/13/17 07:54 04/29/18 07:57 04/29/19 08:14 04/29/20 07:22 05/06/21 07:28 11/06/22 05:47  Hemoglobin 13.0 - 17.0 g/dL 85.1 CANCELED 85.6 84.7 14.8 14.4 14.7 14.8 14.4     Latest Reference Range & Units 10/27/22 11:30  A.Fumigatus #1 Abs Negative  Negative  Micropolyspora faeni, IgG Negative  Negative  Thermoactinomyces vulgaris, IgG Negative  Negative  A. Pullulans Abs Negative  Negative  Thermoact. Saccharii Negative  Negative  Pigeon Serum Abs Negative  Negative     Latest Reference Range & Units 12/19/22 00:00  ENA SSA (RO) Ab 0.0 - 0.9 AI >8.0 (H)  ENA SSB (LA) Ab 0.0 - 0.9 AI 2.2 (H)  (H): Data is abnormally high   12/19/22 00:00   Speckled Pattern 1:1280 (H)  (H): Data is abnormally high  OV 01/04/2023  Subjective:  Patient ID: Joseph Hendricks, male , DOB: Oct 18, 1965 , age 8 y.o. , MRN: [id number] , ADDRESS: [address] [place] [address] PCP Joseph Charlie CROME, MD Patient Care Team: Joseph Charlie CROME, MD as PCP - General (Family Medicine)  This Provider for this visit: Treatment Team:  Attending Provider: Geronimo Amel, MD  Type of visit: Video Virtual Visit Identification of patient Joseph Hendricks with [birth year] and MRN [id number] - 2 person identifier Risks: Risks, benefits, limitations of telephone visit explained. Patient understood and verbalized agreement to proceed Anyone else on call: Just him Patient location: Appears to be in his office This  provider location: [address]; [place]; [place] [address]. [facility]. [phone]    01/04/2023 -   Chief Complaint  Patient presents with   Follow-up    F/up on ILD, confused about which Dr will be taking over ILD care.     HPI Joseph Hendricks 58 y.o. -in this video visit Joseph Hendricks tells me that he is doing much better after starting prednisone  he feels better.  The prednisone  is increasing his hunger and giving some insomnia but he is taking melatonin now.  He is taking his Bactrim .  He is also taking his CellCept .  He met with pharmacy.  Reviewed his record he also met with rheumatologist Dr. Dolphus yesterday.  He is pleased with both visits.  He did have some labs.  He is has got upcoming safety labs.  He wanted to know who would be his primary physician.  I did explain to him with the collaborative support between rheumatology and myself and pulmonary.  I did indicate to him that I will be following the pulmonary side.  Did indicate to him that patient have clinical pharmacist and many times if the primary problem is interstitial lung disease Dr Beatrice will make recommendations and allow me to  adjust his medications.  However I did indicate to him he needs continued monitoring from rheumatology over time because of extrapulmonary manifestations associated with Sjogren's.  He verbalized understanding.   There are no other new issues.  This visit is video check-in    OV 04/09/2023  Subjective:  Patient ID: Joseph Hendricks, male , DOB: 03-21-1966 , age 26 y.o. , MRN: [id number] , ADDRESS: [address] [place] [address] PCP Joseph Charlie CROME, MD Patient Care Team: Joseph Charlie CROME, MD as PCP - General (Family Medicine)  This Provider for this visit: Treatment Team:  Attending Provider: Geronimo Amel, MD   04/09/2023 -   Chief Complaint  Patient presents with   Consult   Follow-up    ILD      HPI Joseph Hendricks 58 y.o. -returns for follow-up.  #Interstitial lung disease due to Sjogren: He is now taking CellCept  at full dose 1000 mg twice daily.  He is finished his prednisone  2 to 3 weeks ago.  Is having some more shortness of breath and fatigue but shortness of breath scale is the same fatigue scale might be slightly worse.  He feels because of prednisone  withdrawal but his pulmonary function test actually shows improvement and this was reassuring.  His sit/stand hypoxemia test was also normal.  #Steroid withdrawal: Having slightly more fatigue and shortness of breath without prednisone  but we took a shared decision making to monitor because pulmonary function test was improved  #New problem of bilateral eyelid swelling particularly in the lower eyelids been going on for 2 or 3 weeks after he stopped prednisone .  I told him that I suspect this because with Sjogren's he is making tears in his tear ducts clogged.  He does not use bluelight for his eyeglasses.  I told him to get bluelight.  Also advised him to wear warm compress.  #Other symptoms: He does have some dizziness when he bends forward and he has shortness of breath but otherwise is able to walk 2  miles 4 times a week and just gets mild shortness of breath with uphill.  He is still able to keep up with his wife and the walk exercise..  Did indicate to  him to get a pulse ox monitor and monitor.  The dizziness comes with crunching and it is expected in ILD patients.  Told him to lose some weight and take care.  #Vaccination: He will have Prevnar today.  We discussed RSV vaccine.  He is below 60 but is immunosuppressed.  Overall risk profile suggest that he should take the RSV vaccine.  He will pay out-of-pocket for this.     OV 08/23/2023  Subjective:  Patient ID: Joseph Hendricks, male , DOB: March 19, 1966 , age 23 y.o. , MRN: [id number] , ADDRESS: [address] [place] [address] PCP Joseph Charlie CROME, MD Patient Care Team: Joseph Charlie CROME, MD as PCP - General (Family Medicine) Joseph Amel, MD as Consulting Physician (Pulmonary Disease)  This Provider for this visit: Treatment Team:  Attending Provider: Geronimo Amel, MD    08/23/2023 -   Chief Complaint  Patient presents with   Follow-up    ILD F/U     HPI Joseph Hendricks 58 y.o. -returns for follow-up.  Since his last visit from a respiratory standpoint he continues to feel better.  His sit/stand exercise hypoxemia test is stable.  His pulmonary function test shows improvement.  He continues just on CellCept  1000 mg twice daily.  He is off prednisone  at this point.  After he came off prednisone  some of his joint pain and restricted mobility is coming back.  I indicated to him some of this could be steroid withdrawal but if if it reflects autoimmune disease then he needs to talk to rheumatology about increasing immunosuppression or going back on steroids.  He said he would follow-up with Dr. JONETTA.  In the interim he did have COVID.  He continues his Bactrim  prophylaxis.  He has not had RSV vaccine and I believe because he is less than 60 he needs a prescription for it.  He does not mind paying for it himself.  I  will tell my staff to call the pharmacy and order it.  He did tell me that in June 2025 visit the labs are ordered or partially being denied by insurance.  I reviewed the codes and I have added the correct codes.  These labs are all medically justified including vitamin D  and TPMT and G6PD.  I have also contacted our coder about this.  Of note he has thoracic aortic aneurysm.  His cardiac surgeon Dr. Kerrin said that he could get a high-resolution CT chest and they could follow-up with that.  Therefore we will order 1 in April 2025.  Otherwise I will see him in 6 months with spirometry.   OV 01/08/2024  Subjective:  Patient ID: Joseph Hendricks, male , DOB: [birth year] , age 57 y.o. , MRN: [id number] , ADDRESS: [address] [place] [address] PCP Joseph Charlie CROME, MD Patient Care Team: Joseph Charlie CROME, MD as PCP - General (Family Medicine) Verlin Lonni JONETTA, MD as PCP - Cardiology (Cardiology) Joseph Amel, MD as Consulting Physician (Pulmonary Disease)  This Provider for this visit: Treatment Team:  Attending Provider: Geronimo Amel, MD    01/08/2024 -   Chief Complaint  Patient presents with   Follow-up    PFT done today. He states that he feels the best he has felt in a year and a half. He has more energy, no aches/pains and his breathing has improved.     Sjogren's with interstitial lung disease  Treatment program starting 12/24/2022-      Increase prednisone   to 60mg  PO daily x 2 weeks( originally started on 40 mg/day on 12/19/2022], then 50mg  daily x 2 weeks, then 40mg  daily x 2 weeks, then 30 mg daily x 2 weeks and then 20mg  daily x 2 weeks and then 10mg  daily to continue - > /ultimately stopped in the fall 2024. Start Bactrim  DS 1 tablet PO Mondays, Wednesdays, Fridays for PJP prophylaxis. Start Cellcept  500mg  PO twice daily x 2 weeks. Repeat CBC and CMP. If stable, increase to 1000mg  twice daily.->  At this dose at least since August/September  2024  HPI Joseph Hendricks 58 y.o. -returns for follow-up.  He says that sometime around April/May 2025 he started feeling dramatically better.  He feels that he just became alive again.  He is less short of breath less arthralgia and improved energy.  He says the shortness of breath is still there although very mild.  He is able to play golf now.  He has seen rheumatology is upcoming rheumatology appointment in August 2025.  He is really grateful and appreciative of all the care he has received.  He is just maintained on CellCept  and Bactrim  at this point.  He had a pulmonary function test [see below] this shows dramatic improvement compared to a year ago.  In addition he had a high-resolution CT chest that I personally visualized and showed it to him and also compared with the previous CT scan and I agree there is dramatic improvement in the groundglass opacities.  There is just mild residual scarring.  I did indicate to him that I would reach out to rheumatology to see how long we should continue his immunosuppressants.  My concern is that if he stopped CellCept  his autoimmune disease could come roaring back.      SYMPTOM SCALE - ILD 12/19/2022 04/09/2023 CellCept  1000 mg twice daily and of chronic prednisone  for the last 10 days. 08/23/2023 CellCept  1000 mg twice daily 01/08/2024 CellCept  1000 mg twice daily along with Bactrim   Current weight      O2 use ra Room air ra Room air and feels the best ever  Shortness of Breath 0 -> 5 scale with 5 being worst (score 6 If unable to do)     At rest 0 0 0 0  Simple tasks - showers, clothes change, eating, shaving 0 0 0 0  Household (dishes, doing bed, laundry) 1.5 2 1  0  Shopping 0 0 0 0  Walking level at own pace 0 0 0 0  Walking up Stairs 2.5 2 1  1.5  Total (30-36) Dyspnea Score 4 4 2  1.5      Non-dyspnea symptoms (0-> 5 scale) 12/19/2022 04/09/2023  08/23/2023  01/08/2024   How bad is your cough? 0 0 0 0  How bad is your fatigue 1.5 2 1 1    How bad is nausea 0 0 0 0  How bad is vomiting?  0 0 0 0  How bad is diarrhea? 0 0 0 0  How bad is anxiety? 1.5 1 1 2   How bad is depression 0 0 0 0  Any chronic pain - if so where and how bad no   Improved arthralgia     Simple office walk 224 (66+46 x 2) feet Pod A at [facility] x  3 laps goal with forehead probe 12/19/2022  04/09/2023    O2 used ra ra   Number laps completed Sit stand x 15 Sit/stand x 10   Comments about pace good  Resting Pulse Ox/HR 96% and 73/min 95%/90/min   Final Pulse Ox/HR 97% and 104/min 97%/111   Desaturated </= 88% no    Desaturated <= 3% points no    Got Tachycardic >/= 90/min yes    Symptoms at end of test Mildd dypsne    Miscellaneous comments x          SIT STAND TEST - goal 15 times   01/08/2024    O2 used ra   PRobe - finter or forehead finger   Number sit and stand completed - goal 15 15   Time taken to complete 30 sec   Resting Pulse Ox/HR/Dyspnea  98% and 77/min and dyspnea of 1/10    Peak measures 98 % and 98/min and dyspnea of 1/10   Final Pulse Ox/HR 98% and 78/min and dyspnea of 1/10   Desaturated </= 88% no   Desaturated <= 3% points no   Got Tachycardic >/= 90/min noo   Miscellaneous comments Normal and fast      PFT     Latest Ref Rng & Units 01/08/2024    8:18 AM 06/27/2023    8:28 AM 04/04/2023   10:32 AM 12/05/2022   10:59 AM  PFT Results  FVC-Pre L 4.11  P 3.88  3.82  3.41   FVC-Predicted Pre % 85  P 80  79  70   FVC-Post L    3.44   FVC-Predicted Post %    71   Pre FEV1/FVC % % 79  P 81  81  81   Post FEV1/FCV % %    84   FEV1-Pre L 3.25  P 3.14  3.11  2.77   FEV1-Predicted Pre % 89  P 86  84  75   FEV1-Post L    2.89   DLCO uncorrected ml/min/mmHg 22.27  P 21.57  20.33  19.84   DLCO UNC% % 80  P 77  72  70   DLCO corrected ml/min/mmHg   20.33  19.95   DLCO COR %Predicted %   72  71   DLVA Predicted % 97  P 97  97  95   TLC L    5.24   TLC % Predicted %    74   RV % Predicted %    89     P  Preliminary result       LAB RESULTS last 96 hours No results found.        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   reports that he has never smoked. He has never been exposed to tobacco smoke. He has never used smokeless tobacco.  Past Surgical History:  Procedure Laterality Date   APPENDECTOMY     BRONCHIAL BIOPSY  11/06/2022   Procedure: BRONCHIAL BIOPSIES;  Surgeon: Shelah Joseph RAMAN, MD;  [facility] ENDOSCOPY;  Service: Pulmonary;;   BRONCHIAL WASHINGS  11/06/2022   Procedure: BRONCHIAL WASHINGS;  Surgeon: Shelah Joseph RAMAN, MD;  Location: [facility] ENDOSCOPY;  Service: Pulmonary;;   COLONOSCOPY     HERNIA REPAIR     KNEE SURGERY Left    SHOULDER SURGERY Right    SQUAMOUS CELL CARCINOMA EXCISION     x3 or 4 per patient, bilateral arms, back and leg   VIDEO  BRONCHOSCOPY Bilateral 11/06/2022   Procedure: VIDEO BRONCHOSCOPY WITH FLUORO;  Surgeon: Shelah Joseph RAMAN, MD;  Location: [facility] ENDOSCOPY;  Service: Pulmonary;  Laterality: Bilateral;   WISDOM TOOTH EXTRACTION      No Known Allergies  Immunization History  Administered Date(s) Administered   Hepatitis B 01/24/2002   Influenza Inj Mdck Quad Pf 05/30/2022   Influenza,inj,Quad PF,6+ Mos 04/12/2016, 04/28/2020, 05/04/2021   Influenza-Unspecified 04/10/2015, 03/29/2023   Moderna Covid-19 Fall Seasonal Vaccine 73yrs & older 05/30/2022   Moderna Covid-19 Vaccine  Bivalent Booster 57yrs & up 05/19/2021   Moderna Sars-Covid-2 Vaccination 09/17/2019, 10/17/2019, 05/28/2020   PNEUMOCOCCAL CONJUGATE-20 04/09/2023   Pfizer Covid-19 Vaccine Bivalent Booster 72yrs & up 03/29/2023   Pneumococcal Polysaccharide-23  06/09/2014   Rsv, Bivalent, Protein Subunit Rsvpref,pf [place]) 09/02/2023   Td 04/23/2018   Tdap 02/03/2008   Typhoid Inactivated 01/24/2002   Zoster Recombinant(Shingrix ) 04/28/2020    Family History  Problem Relation Age of Onset   Arthritis Mother        osteo   Factor V Leiden deficiency Mother    Diabetes Father    Alzheimer's disease Father    Healthy Sister    Allergies Brother    Cancer Brother        testicular   Healthy Brother    Healthy Son    Healthy Daughter    Healthy Daughter      Current Outpatient Medications:    azelastine  (ASTELIN ) 0.1 % nasal spray, Place 2 sprays into both nostrils in the morning. Use in each nostril as directed, Disp: 30 mL, Rfl: 1   Cholecalciferol (VITAMIN D3 PO), Take 1 tablet by mouth at bedtime., Disp: , Rfl:    Doxylamine Succinate, Sleep, (UNISOM PO), Take by mouth at bedtime., Disp: , Rfl:    fluticasone  (FLONASE ) 50 MCG/ACT nasal spray, Place 2 sprays into both nostrils in the morning and at bedtime. (Patient taking differently: Place 2 sprays into both nostrils as needed.), Disp: , Rfl:    Multiple Vitamins-Minerals (MENS MULTIVITAMIN PLUS) TABS, Take 1 tablet by mouth at bedtime., Disp: , Rfl:    mycophenolate  (CELLCEPT ) 500 MG tablet, Take 2 tablets (1,000 mg total) by mouth 2 (two) times daily., Disp: 360 tablet, Rfl: 1   rosuvastatin  (CRESTOR ) 10 MG tablet, Take 1 tablet (10 mg total) by mouth daily., Disp: 90 tablet, Rfl: 3   sulfamethoxazole -trimethoprim  (BACTRIM  DS) 800-160 MG tablet, Take one tablet by mouth on Mondays, Wednesdays, and Fridays only., Disp: 36 tablet, Rfl: 2      Objective:   Vitals:   01/08/24 0857 01/08/24 0858  BP: 104/60   Pulse: 72   SpO2: 97%   Weight:  193 lb (87.5 kg)  Height:  5' 10 (1.778 m)    Estimated body mass index is 27.69 kg/m as calculated from the following:   Height as of this encounter: 5' 10 (1.778 m).   Weight as of this encounter: 193 lb (87.5  kg).  @WEIGHTCHANGE @  [employer]   01/08/24 0858  Weight: 193 lb (87.5 kg)     Physical Exam   General: No distress. Looks well O2 at rest: no Cane present: no Sitting in wheel chair: no Frail: no Obese: no Neuro: Alert and Oriented x 3. GCS 15. Speech normal Psych: Pleasant Resp:  Barrel Chest - no.  Wheeze - no, Crackles - no, No overt respiratory distress CVS: Normal heart sounds. Murmurs - no Ext: Stigmata of Connective Tissue Disease - nio HEENT: Normal upper airway. PEERL +.  No post nasal drip        Assessment:       ICD-10-CM   1. Interstitial lung disease due to connective tissue disease (HCC)  J84.89 CBC w/Diff   M35.9 Basic Metabolic Panel (BMET)    Hepatic function panel    Hepatic function panel    CBC w/Diff    Basic Metabolic Panel (BMET)    2. High risk medication use  Z79.899 CBC w/Diff    Basic Metabolic Panel (BMET)    Hepatic function panel    Hepatic function panel    CBC w/Diff    Basic Metabolic Panel (BMET)    3. Medication monitoring encounter  Z51.81 CBC w/Diff    Basic Metabolic Panel (BMET)    Hepatic function panel    Hepatic function panel    CBC w/Diff    Basic Metabolic Panel (BMET)         Plan:     Patient Instructions     ICD-10-CM   1. Interstitial lung disease due to connective tissue disease (HCC)  J84.89 CBC w/Diff   M35.9 Basic Metabolic Panel (BMET)    Hepatic function panel    2. High risk medication use  Z79.899 CBC w/Diff    Basic Metabolic Panel (BMET)    Hepatic function panel    3. 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  Pulmonary function test is actually improved.    Symptoms improved. CT is much improved in April 2025  PLAN -  At this point in time I do not see a need for   prednisone  OR ANTIFIBROTIC -Continue Bactrim  DS 1 tablet PO Mondays, Wednesdays, Fridays for PJP prophylaxis. -Continue CellCept  1000 mg twice daily  - but talk to Cheryl Birmingham as to how long to do this medicine given HUGE IMPROVEMENT  - sent message -Check CBC, chemistry, liver function 01/08/2024  - Continue daily aerobic exercise but can include gentle weight training - consider STRETCH ZOE -Do spirometry and DLCO in 6 months    Follow-up - 54-month ; 15-minute visit but after spirometry and DLCO.   FOLLOWUP Return in about 6 months (around 07/10/2024) for 15 min visit, with Dr Joseph, Face to Face Visit.    SIGNATURE    Dr. Dorethia Joseph, M.D., F.C.C.P,  Pulmonary and Critical Care Medicine Staff Physician, [facility] Director - Interstitial Lung Disease  Program  Pulmonary Fibrosis [facility] at [facility] [place], [place], [address]  Pager: [phone], If no answer or between  15:00h - 7:00h: call [phone] Telephone: [phone]  9:39 AM 01/08/2024

## 2024-01-08 NOTE — Patient Instructions (Signed)
Spiro/DLCO performed today. 

## 2024-01-10 ENCOUNTER — Telehealth: Payer: Self-pay | Admitting: Cardiovascular Disease

## 2024-01-10 NOTE — Telephone Encounter (Signed)
 Pt calling in for echo results

## 2024-01-10 NOTE — Telephone Encounter (Signed)
 Left the pt a message to call the office back to endorse echo results per Dr. Verlin.   Lonni JONETTA Verlin, MD 01/07/2024 10:45 AM EDT     His heart is strong. No valve disease. 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 chris

## 2024-01-14 NOTE — Telephone Encounter (Signed)
 Lauraine MARLA Bonus, RN 01/10/2024 12:06 PM EDT     Patient called.  Patient aware.

## 2024-01-30 ENCOUNTER — Other Ambulatory Visit: Payer: Self-pay | Admitting: Internal Medicine

## 2024-01-30 DIAGNOSIS — J849 Interstitial pulmonary disease, unspecified: Secondary | ICD-10-CM

## 2024-01-30 DIAGNOSIS — Z2989 Encounter for other specified prophylactic measures: Secondary | ICD-10-CM

## 2024-02-12 NOTE — Progress Notes (Deleted)
 Office Visit Note  Patient: Joseph Hendricks             Date of Birth: June 04, 1966           MRN: [id number]             PCP: Bertrum Charlie CROME, MD Referring: Bertrum Charlie CROME, MD Visit Date: 02/26/2024 Occupation: @GUAROCC @  Subjective:  No chief complaint on file.   History of Present Illness: Joseph Hendricks is a 58 y.o. male ***     Activities of Daily Living:  Patient reports morning stiffness for *** {minute/hour:19697}.   Patient {ACTIONS;DENIES/REPORTS:21021675::Denies} nocturnal pain.  Difficulty dressing/grooming: {ACTIONS;DENIES/REPORTS:21021675::Denies} Difficulty climbing stairs: {ACTIONS;DENIES/REPORTS:21021675::Denies} Difficulty getting out of chair: {ACTIONS;DENIES/REPORTS:21021675::Denies} Difficulty using hands for taps, buttons, cutlery, and/or writing: {ACTIONS;DENIES/REPORTS:21021675::Denies}  No Rheumatology ROS completed.   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   in [place] - patient is not aware of this dx as of 11/03/22   Asthma    patient denies this dx as 11/03/22   Chronic cough 10/2022   GERD (gastroesophageal reflux disease)    HLD (hyperlipidemia)    Interstitial lung disease (HCC) 10/2022   Pneumonia    x 1   Seasonal allergies    Sjogren syndrome (HCC)    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   right  forearm, left wrist, calf   Tuberculosis    patient denies this dx as of 11/03/22    Family History  Problem Relation Age of Onset   Arthritis Mother        osteo   Factor V Leiden deficiency Mother    Diabetes Father    Alzheimer's disease Father    Healthy Sister    Allergies Brother    Cancer Brother        testicular   Healthy Brother    Healthy Son    Healthy Daughter    Healthy Daughter    Past Surgical History:  Procedure Laterality Date   APPENDECTOMY     BRONCHIAL BIOPSY  11/06/2022   Procedure: BRONCHIAL BIOPSIES;  Surgeon: Shelah Lamar GORMAN, MD;  Location: [facility];  Service: Pulmonary;;   BRONCHIAL WASHINGS  11/06/2022   Procedure: BRONCHIAL WASHINGS;  Surgeon: Shelah Lamar GORMAN, MD;  Location: [facility];  Service: Pulmonary;;   COLONOSCOPY     HERNIA REPAIR     KNEE SURGERY Left    SHOULDER SURGERY Right    SQUAMOUS CELL CARCINOMA EXCISION     x3 or 4 per patient, bilateral arms, back and leg   VIDEO BRONCHOSCOPY Bilateral 11/06/2022   Procedure: VIDEO BRONCHOSCOPY WITH FLUORO;  Surgeon: Shelah Lamar GORMAN, MD;  Location: [facility] ENDOSCOPY;  Service: Pulmonary;  Laterality: Bilateral;   WISDOM TOOTH EXTRACTION     Social History   Social History Narrative   Not on file   Immunization History  Administered Date(s) Administered    sv,  Bivalent, Protein Subunit Rsvpref,pf [place]) 09/02/2023   Hepatitis B 01/24/2002   Influenza Inj Mdck Quad Pf 05/30/2022   Influenza,inj,Quad PF,6+ Mos 04/12/2016, 04/28/2020, 05/04/2021   Influenza-Unspecified 04/10/2015, 03/29/2023   Moderna Covid-19 Fall Seasonal Vaccine 55yrs & older 05/30/2022   Moderna Covid-19 Vaccine  Bivalent Booster 80yrs & up 05/19/2021   Moderna Sars-Covid-2 Vaccination 09/17/2019, 10/17/2019, 05/28/2020   PNEUMOCOCCAL CONJUGATE-20 04/09/2023   Pfizer Covid-19 Vaccine Bivalent Booster 108yrs & up 03/29/2023   Pneumococcal Polysaccharide-23 06/09/2014   Td 04/23/2018   Tdap 02/03/2008   Typhoid  Inactivated 01/24/2002   Zoster Recombinant(Shingrix ) 04/28/2020     Objective: Vital Signs: There were no vitals taken for this visit.   Physical Exam   Musculoskeletal Exam: ***  CDAI Exam: CDAI Score: -- Patient Global: --; Provider Global: -- Swollen: --; Tender: -- Joint Exam 02/26/2024   No joint exam has been documented for this visit   There is currently no information documented on the homunculus. Go to the Rheumatology activity and complete the homunculus joint exam.  Investigation: No additional findings.  Imaging: No results found.  Recent Labs: Lab Results  Component Value Date   WBC 4.3 01/08/2024   HGB 14.8 01/08/2024   PLT 212.0 01/08/2024   NA 138 01/08/2024   K 4.1 01/08/2024   CL 106 01/08/2024   CO2 24 01/08/2024   GLUCOSE 101 (H) 01/08/2024   BUN 12 01/08/2024   CREATININE 0.85 01/08/2024   BILITOT 1.0 01/08/2024   ALKPHOS 43 01/08/2024   AST 18 01/08/2024   ALT 21 01/08/2024   PROT 7.6 01/08/2024   ALBUMIN 4.5 01/08/2024   CALCIUM  9.3 01/08/2024   GFRAA 101 04/29/2020   QFTBGOLDPLUS NEGATIVE 12/19/2022    Speciality Comments: No specialty comments available.  Procedures:  No procedures performed Allergies: Patient has no known allergies.   Assessment / Plan:     Visit Diagnoses: ILD (interstitial lung disease) (HCC)  Sjogren's syndrome with other organ involvement (HCC)  High risk medication use  Chronic pain of both shoulders  Chronic SI joint pain  Mild intermittent asthma without complication  Seasonal allergic rhinitis, unspecified trigger  Gastroesophageal reflux disease without esophagitis  Benign fibroma of prostate  Bergmann's syndrome  Basal cell carcinoma (BCC), unspecified site  Hypercholesteremia  Family history of neurological disease-Alzheimer's in his father and paternal grandmother  Orders: No orders of the defined types were placed in this encounter.  No orders of the defined types were  placed in this encounter.   Face-to-face time spent with patient was *** minutes. Greater than 50% of time was spent in counseling and coordination of care.  Follow-Up Instructions: No follow-ups on file.   Waddell CHRISTELLA Craze, PA-C  Note - This record has been created using Dragon software.  Chart creation errors have been sought, but may not always  have been located. Such creation errors do not reflect on  the standard of medical care.

## 2024-02-18 NOTE — Progress Notes (Addendum)
 Office Visit Note  Patient: Joseph Hendricks             Date of Birth: 1965/12/05           MRN: [id number]             PCP: Bertrum Charlie CROME, MD Referring: Bertrum Charlie CROME, MD Visit Date: 03/03/2024 Occupation: @GUAROCC @  Subjective:  Discuss cellcept  dose   History of Present Illness: [name] is a 58 y.o. male with a history of ILD and sjogren's syndrome.  Patient is currently taking cellcept  500 mg 2 tablets by mouth twice daily and Bactrim  1 tablet daily Monday, Wednesday, and Fridays only.  He continues to tolerate CellCept  without any side effects and has not missed any doses recently. Patient states that from a pulmonary standpoint he has not developed any new or worsening symptoms.  Patient states that his energy level has improved and he has been able to be more active with less difficulty.  He has been walking up to 4 days a week with his wife without difficulty. He has occasional eye dryness but denies any increased mouth dryness.  He has been using warm compresses on his eyes at night which he finds to be helpful. He continues to have occasional arthralgias especially in the hands and hips but states that overall his symptoms have been manageable.  Patient states that his symptoms are typically exacerbated by strenuous physical activity such as yard work.  He denies any joint swelling at this time.      Activities of Daily Living:  Patient reports morning stiffness for less than 5 minutes.   Patient Denies nocturnal pain.  Difficulty dressing/grooming: Denies Difficulty climbing stairs: Denies Difficulty getting out of chair: Denies Difficulty using hands for taps, buttons, cutlery, and/or writing: Denies  Review of Systems  Constitutional:  Negative for fatigue.  HENT:  Negative for mouth sores and mouth dryness.   Eyes:  Positive for dryness.  Respiratory:  Positive for shortness of breath.   Cardiovascular:  Negative for chest pain and palpitations.   Gastrointestinal:  Negative for blood in stool, constipation and diarrhea.  Endocrine: Positive for increased urination.  Genitourinary:  Negative for involuntary urination.  Musculoskeletal:  Positive for joint pain, joint pain, myalgias, muscle weakness, morning stiffness, muscle tenderness and myalgias. Negative for gait problem and joint swelling.  Skin:  Negative for color change, rash, hair loss and sensitivity to sunlight.  Allergic/Immunologic: Negative for susceptible to infections.  Neurological:  Negative for dizziness and headaches.  Hematological:  Negative for swollen glands.  Psychiatric/Behavioral:  Positive for sleep disturbance. Negative for depressed mood. The patient is nervous/anxious.     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   in [place] - patient is not aware of this dx as of 11/03/22   Asthma    patient denies this dx as 11/03/22   Chronic cough 10/2022  GERD (gastroesophageal reflux disease)    HLD (hyperlipidemia)    Interstitial lung disease (HCC) 10/2022   Pneumonia    x 1   Seasonal allergies    Sjogren syndrome (HCC)    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   right forearm, left wrist, calf   Tuberculosis    patient denies this dx as of 11/03/22    Family History  Problem Relation Age of Onset   Arthritis Mother        osteo    Factor V Leiden deficiency Mother    Diabetes Father    Alzheimer's disease Father    Healthy Sister    Allergies Brother    Cancer Brother        testicular   Healthy Brother    Healthy Son    Healthy Daughter    Healthy Daughter    Past Surgical History:  Procedure Laterality Date   APPENDECTOMY     BRONCHIAL BIOPSY  11/06/2022   Procedure: BRONCHIAL BIOPSIES;  Surgeon: Shelah Lamar RAMAN, MD;  Location: [facility];  Service: Pulmonary;;   BRONCHIAL WASHINGS  11/06/2022   Procedure: BRONCHIAL WASHINGS;  Surgeon: Shelah Lamar RAMAN, MD;  Location: [facility];  Service: Pulmonary;;   COLONOSCOPY     HERNIA REPAIR     KNEE SURGERY Left    SHOULDER SURGERY Right    SQUAMOUS CELL CARCINOMA EXCISION     x3 or 4 per patient, bilateral arms, back and leg   VIDEO BRONCHOSCOPY Bilateral 11/06/2022   Procedure: VIDEO BRONCHOSCOPY WITH FLUORO;  Surgeon: Shelah Lamar RAMAN, MD;  Location: [facility] ENDOSCOPY;  Service: Pulmonary;  Laterality: Bilateral;   WISDOM TOOTH EXTRACTION     Social History   Social History Narrative   Not on file   Immunization History  Administered Date(s) Administered    sv, Bivalent, Protein Subunit Rsvpref,pf (Abrysvo) 09/02/2023   Hepatitis B 01/24/2002   Influenza Inj Mdck Quad Pf 05/30/2022   Influenza,inj,Quad PF,6+ Mos 04/12/2016, 04/28/2020, 05/04/2021   Influenza-Unspecified 04/10/2015, 03/29/2023   Moderna Covid-19 Fall Seasonal Vaccine 67yrs & older 05/30/2022   Moderna Covid-19 Vaccine  Bivalent Booster 27yrs & up 05/19/2021   Moderna Sars-Covid-2 Vaccination 09/17/2019, 10/17/2019, 05/28/2020   PNEUMOCOCCAL CONJUGATE-20 04/09/2023   Pfizer Covid-19 Vaccine Bivalent Booster 32yrs & up 03/29/2023   Pneumococcal Polysaccharide-23 06/09/2014   Td 04/23/2018   Tdap 02/03/2008   Typhoid Inactivated 01/24/2002   Zoster Recombinant(Shingrix ) 04/28/2020     Objective: Vital Signs: BP 116/78 (BP Location: Left Arm, Patient Position: Sitting, Cuff Size:  Normal)   Pulse 72   Resp 14   Ht 5' 10 (1.778 m)   Wt 196 lb (88.9 kg)   BMI 28.12 kg/m    Physical Exam Vitals and nursing note reviewed.  Constitutional:      Appearance: He is well-developed.  HENT:     Head: Normocephalic and atraumatic.  Eyes:     Conjunctiva/sclera: Conjunctivae normal.     Pupils: Pupils are equal, round, and reactive to light.  Cardiovascular:     Rate and Rhythm: Normal rate and regular rhythm.     Heart sounds: Normal heart sounds.  Pulmonary:     Effort: Pulmonary effort is normal.     Breath sounds: Normal breath sounds.  Abdominal:     General: Bowel sounds are normal.     Palpations: Abdomen is soft.  Musculoskeletal:     Cervical back: Normal range of motion and neck supple.  Skin:    General: Skin is warm and dry.     Capillary Refill: Capillary refill takes less than 2 seconds.  Neurological:     Mental Status: He is alert and oriented to person, place, and time.  Psychiatric:        Behavior: Behavior normal.      Musculoskeletal Exam: C-spine, thoracic spine, lumbar spine have good range of motion.  Shoulder joints, elbow joints, wrist joints, MCPs, PIPs and DIPs have good range of motion with no synovitis.  Complete fist formation bilaterally.  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  Hip joints have good range of motion with no groin pain.  Mild tenderness over both SI joints noted.  Knee joints have good range of motion no warmth or effusion.  Ankle joints have good range of motion with no tenderness or joint swelling.  CDAI Exam: CDAI Score: -- Patient Global: --; Provider Global: -- Swollen: --; Tender: -- Joint Exam 03/03/2024   No joint exam has been documented for this visit   There is currently no information documented on the homunculus. Go to the Rheumatology activity and complete the homunculus joint exam.  Investigation: No additional findings.  Imaging: No results  found.  Recent Labs: Lab Results  Component Value Date   WBC 4.3 01/08/2024   HGB 14.8 01/08/2024   PLT 212.0 01/08/2024   NA 138 01/08/2024   K 4.1 01/08/2024   CL 106 01/08/2024   CO2 24 01/08/2024   GLUCOSE 101 (H) 01/08/2024   BUN 12 01/08/2024   CREATININE 0.85 01/08/2024   BILITOT 1.0 01/08/2024   ALKPHOS 43 01/08/2024   AST 18 01/08/2024   ALT 21 01/08/2024   PROT 7.6 01/08/2024   ALBUMIN 4.5 01/08/2024   CALCIUM  9.3 01/08/2024   GFRAA 101 04/29/2020   QFTBGOLDPLUS NEGATIVE 12/19/2022    Speciality Comments: No specialty comments available.  Procedures:  No procedures performed Allergies: Patient has no known allergies.   Assessment / Plan:     Visit Diagnoses: ILD (interstitial lung disease) (HCC) - NSIP pattern.  He has been off prednisone .  He has been on CellCept  500 mg 2 tablets p.o. twice daily since June 2024. Clinically stable--reviewed Dr. Reeves office visit note from 01/08/2024--pulmonary function testing has improved.  Symptoms have improved and the high-resolution chest CT has also improved from April 2025.  No need for oral corticosteroids or antifibrotic agents at this time. Dr. Geronimo had reached out on 01/08/2024 regarding whether to consider reducing the dose of CellCept  or discontinuing since he has had a major improvement clinically. Discussed that I would be apprehensive completely discontinuing CellCept  given history of Sjogren syndrome.  Discussed that Sjogren syndrome is a systemic condition that would likely benefit from continued immunomodulation with close monitoring.  He continues to have mild sicca symptoms as well as intermittent arthralgias.  He is also apprehensive to discontinue CellCept  altogether due to the concern for a flare in the future.  He has not been having any recent or recurrent infections.  He has been tolerating CellCept  without side effects.  Discussed the option of trying a reduced dose of CellCept  500 mg 2 tablets in  the morning and 1 tablet in the evening and he was in agreement.  He will notify us  if he develops any new or worsening symptoms at which time he can increase the dose back to 500 mg 2 tablets twice daily if needed. Plan to update lab work in  October 2025--Future orders were placed today. Plan to repeat spirometry and DLCO along with an office visit with Dr. Geronimo in 6 months. He will follow-up in our office in January 2026.  Sjogren's syndrome with other organ involvement (HCC) - Positive ANA, positive SSA antibody, positive SSB antibody.  Patient has minimal dry mouth and dry eye symptoms-He has been using warm compresses on his eyes at night. He continues to experience intermittent arthralgias especially in his hands, left shoulder, and both hips.  No synovitis was noted on examination today. Plan to update the following lab work in October 2025 for further evaluation.  He plans on trying to reduce the dose of CellCept  to 500 mg 2 tablets in the morning and 1 tablet in the evening and remain on Bactrim  3 days a week as prescribed by Dr. Geronimo.  He will notify us  if he develops any new or worsening symptoms.  Plan: Rheumatoid factor, Serum protein electrophoresis with reflex, C3 and C4, Sedimentation rate, Comprehensive metabolic panel with GFR, Urinalysis, Routine w reflex microscopic, CBC with Differential/Platelet, Sjogrens syndrome-B extractable nuclear antibody, Sjogrens syndrome-A extractable nuclear antibody  High risk medication use - CellCept  500 mg 2 tablets in the AM and 1 tablet in the PM. (prescribed by Dr. Geronimo) and Bactrim  DS 3 times a week.  CBC, BMP, and hepatic function panel updated on 01/08/24. His next lab work will be due in October and every 3 months. No recent or recurrent infections. - Plan: Comprehensive metabolic panel with GFR, CBC with Differential/Platelet  Chronic pain of both shoulders: He continues to have intermittent discomfort in the left shoulder.  He  has good range of motion of both shoulders on examination today.  No nocturnal pain.  Chronic SI joint pain: He has intermittent discomfort in both hips.  Mild tenderness upon palpation noted today.  Other medical conditions are listed as follows:   Mild intermittent asthma without complication  Seasonal allergic rhinitis, unspecified trigger  Gastroesophageal reflux disease without esophagitis  Benign fibroma of prostate  Bergmann's syndrome  Basal cell carcinoma (BCC), unspecified site  Hypercholesteremia  Family history of neurological disease-Alzheimer's in his father and paternal grandmother  Orders: Orders Placed This Encounter  Procedures   Rheumatoid factor   Serum protein electrophoresis with reflex   C3 and C4   Sedimentation rate   Comprehensive metabolic panel with GFR   Urinalysis, Routine w reflex microscopic   CBC with Differential/Platelet   Sjogrens syndrome-B extractable nuclear antibody   Sjogrens syndrome-A extractable nuclear antibody   No orders of the defined types were placed in this encounter.   Follow-Up Instructions: Return in about 5 months (around 08/03/2024).   Waddell CHRISTELLA Craze, PA-C  Note - This record has been created using Dragon software.  Chart creation errors have been sought, but may not always  have been located. Such creation errors do not reflect on  the standard of medical care.

## 2024-02-26 ENCOUNTER — Ambulatory Visit: Admitting: Physician Assistant

## 2024-02-26 DIAGNOSIS — G8929 Other chronic pain: Secondary | ICD-10-CM

## 2024-02-26 DIAGNOSIS — Z820 Family history of epilepsy and other diseases of the nervous system: Secondary | ICD-10-CM

## 2024-02-26 DIAGNOSIS — M3509 Sicca syndrome with other organ involvement: Secondary | ICD-10-CM

## 2024-02-26 DIAGNOSIS — J849 Interstitial pulmonary disease, unspecified: Secondary | ICD-10-CM

## 2024-02-26 DIAGNOSIS — K449 Diaphragmatic hernia without obstruction or gangrene: Secondary | ICD-10-CM

## 2024-02-26 DIAGNOSIS — J4520 Mild intermittent asthma, uncomplicated: Secondary | ICD-10-CM

## 2024-02-26 DIAGNOSIS — E7800 Pure hypercholesterolemia, unspecified: Secondary | ICD-10-CM

## 2024-02-26 DIAGNOSIS — N400 Enlarged prostate without lower urinary tract symptoms: Secondary | ICD-10-CM

## 2024-02-26 DIAGNOSIS — C4491 Basal cell carcinoma of skin, unspecified: Secondary | ICD-10-CM

## 2024-02-26 DIAGNOSIS — Z79899 Other long term (current) drug therapy: Secondary | ICD-10-CM

## 2024-02-26 DIAGNOSIS — J302 Other seasonal allergic rhinitis: Secondary | ICD-10-CM

## 2024-02-26 DIAGNOSIS — K219 Gastro-esophageal reflux disease without esophagitis: Secondary | ICD-10-CM

## 2024-02-29 ENCOUNTER — Ambulatory Visit: Admitting: Internal Medicine

## 2024-03-03 ENCOUNTER — Ambulatory Visit: Attending: Physician Assistant | Admitting: Physician Assistant

## 2024-03-03 ENCOUNTER — Encounter: Payer: Self-pay | Admitting: Physician Assistant

## 2024-03-03 VITALS — BP 116/78 | HR 72 | Resp 14 | Ht 70.0 in | Wt 196.0 lb

## 2024-03-03 DIAGNOSIS — Z79899 Other long term (current) drug therapy: Secondary | ICD-10-CM | POA: Diagnosis not present

## 2024-03-03 DIAGNOSIS — M25512 Pain in left shoulder: Secondary | ICD-10-CM

## 2024-03-03 DIAGNOSIS — M533 Sacrococcygeal disorders, not elsewhere classified: Secondary | ICD-10-CM

## 2024-03-03 DIAGNOSIS — J849 Interstitial pulmonary disease, unspecified: Secondary | ICD-10-CM | POA: Diagnosis not present

## 2024-03-03 DIAGNOSIS — N400 Enlarged prostate without lower urinary tract symptoms: Secondary | ICD-10-CM

## 2024-03-03 DIAGNOSIS — K219 Gastro-esophageal reflux disease without esophagitis: Secondary | ICD-10-CM

## 2024-03-03 DIAGNOSIS — M3509 Sicca syndrome with other organ involvement: Secondary | ICD-10-CM

## 2024-03-03 DIAGNOSIS — C4491 Basal cell carcinoma of skin, unspecified: Secondary | ICD-10-CM

## 2024-03-03 DIAGNOSIS — E7800 Pure hypercholesterolemia, unspecified: Secondary | ICD-10-CM

## 2024-03-03 DIAGNOSIS — M25511 Pain in right shoulder: Secondary | ICD-10-CM

## 2024-03-03 DIAGNOSIS — J4520 Mild intermittent asthma, uncomplicated: Secondary | ICD-10-CM

## 2024-03-03 DIAGNOSIS — Z820 Family history of epilepsy and other diseases of the nervous system: Secondary | ICD-10-CM

## 2024-03-03 DIAGNOSIS — G8929 Other chronic pain: Secondary | ICD-10-CM

## 2024-03-03 DIAGNOSIS — J302 Other seasonal allergic rhinitis: Secondary | ICD-10-CM

## 2024-03-03 DIAGNOSIS — K449 Diaphragmatic hernia without obstruction or gangrene: Secondary | ICD-10-CM

## 2024-03-03 NOTE — Patient Instructions (Signed)
 Standing Labs We placed an order today for your standing lab work.   Please have your standing labs drawn in October and every 3 months  Please have your labs drawn 2 weeks prior to your appointment so that the provider can discuss your lab results at your appointment, if possible.  Please note that you Paulino see your imaging and lab results in MyChart before we have reviewed them. We will contact you once all results are reviewed. Please allow our office up to 72 hours to thoroughly review all of the results before contacting the office for clarification of your results.  WALK-IN LAB HOURS  Monday through Thursday from 8:00 am -12:30 pm and 1:00 pm-4:30 pm and Friday from 8:00 am-12:00 pm.  Patients with office visits requiring labs will be seen before walk-in labs.  You Sando encounter longer than normal wait times. Please allow additional time. Wait times Hillery be shorter on  Monday and Thursday afternoons.  We do not book appointments for walk-in labs. We appreciate your patience and understanding with our staff.   Labs are drawn by [facility]. Please bring your co-pay at the time of your lab draw.  You Cureton receive a bill from [facility] for your lab work.  Please note if you are on Hydroxychloroquine and and an order has been placed for a Hydroxychloroquine level,  you will need to have it drawn 4 hours or more after your last dose.  If you wish to have your labs drawn at another location, please call the office 24 hours in advance so we can fax the orders.  The office is located at [address], [address], [place], [place] [address]   If you have any questions regarding directions or hours of operation,  please call [phone].   As a reminder, please drink plenty of water prior to coming for your lab work. Thanks!

## 2024-03-03 NOTE — Addendum Note (Signed)
 Addended by: CHERYL WADDELL HERO on: 03/03/2024 12:12 PM   Modules accepted: Level of Service

## 2024-04-22 ENCOUNTER — Encounter: Payer: Self-pay | Admitting: Internal Medicine

## 2024-04-22 NOTE — Telephone Encounter (Signed)
**Note De-identified  Woolbright Obfuscation** Please advise 

## 2024-04-23 ENCOUNTER — Telehealth: Payer: Self-pay

## 2024-04-23 DIAGNOSIS — J849 Interstitial pulmonary disease, unspecified: Secondary | ICD-10-CM

## 2024-04-23 DIAGNOSIS — Z2989 Encounter for other specified prophylactic measures: Secondary | ICD-10-CM

## 2024-04-23 NOTE — Telephone Encounter (Signed)
 Copied from CRM [id number]. Topic: Clinical - Prescription Issue >> Apr 23, 2024  9:56 AM Corean SAUNDERS wrote: Reason for CRM: Shawn a pharmacist with [facility] is requesting a dosage change for mycophenolate  (CELLCEPT ) 500 MG tablet as it is currently at 2 tabs 2 times daily but the patient reported 2 tabs in the morning and 1 tab at night.   Dr Geronimo, is this okay?

## 2024-04-23 NOTE — Telephone Encounter (Signed)
 Think either novavax of Pfizere/moderna mRNA vaccine against COVID is fine.  As long as he gets any 1 thing.  All the vaccines have similar side effects including extremely rare myocarditis.  But the general feeling of unwellness side effects are reportedly less with the novavax vaccine.

## 2024-04-23 NOTE — Telephone Encounter (Signed)
 Aleck  Can yo uinviestigate his cellcept  please?  Thanks    SIGNATURE    Dr. Dorethia Cave, M.D., F.C.C.P,  Pulmonary and Critical Care Medicine Staff Physician, [facility] Director - Interstitial Lung Disease  Program  Pulmonary Fibrosis [facility] at [facility] [place], [place], [address]   Pager: [phone], If no answer  -> Check AMION or Try [phone] Telephone (clinical office): [phone] Telephone (research): [phone]  4:49 PM 04/23/2024

## 2024-04-24 ENCOUNTER — Telehealth: Payer: Self-pay

## 2024-04-24 ENCOUNTER — Other Ambulatory Visit: Payer: Self-pay | Admitting: *Deleted

## 2024-04-24 DIAGNOSIS — Z79899 Other long term (current) drug therapy: Secondary | ICD-10-CM

## 2024-04-24 DIAGNOSIS — M3509 Sicca syndrome with other organ involvement: Secondary | ICD-10-CM

## 2024-04-24 MED ORDER — MYCOPHENOLATE MOFETIL 500 MG PO TABS: ORAL_TABLET | ORAL | 2 refills | Status: DC

## 2024-04-24 NOTE — Telephone Encounter (Signed)
 Patient has been on CellCept  500mg  2 tablets PO BID since June 2024. From chart review and rheumatology OV 03/03/24 note, Dr. Geronimo had reached out on 01/08/2024 regarding whether to consider reducing the dose of CellCept  or discontinuing since he has had a major improvement clinically.  After discussion with rheumatology provider on 03/03/24, plan was to reduce CellCept  to 500mg  tab, Take 2 tablets in the morning and 1 tablet in the evening. Patient was agreeable to this per notes.  Verbal order given to [facility] for updated dosing and med list updated.  Aleck Puls, PharmD, BCPS, CPP Clinical Pharmacist  [facility]

## 2024-04-24 NOTE — Telephone Encounter (Signed)
 Copied from CRM [id number]. Topic: Clinical - Prescription Issue >> Apr 21, 2024  3:38 PM Rozanna MATSU wrote: Reason for CRM: Roe with [facility] stated the prescription for mycophenolate  (CELLCEPT ) 500 MG stated pt advised dosage changed they will new prescription with the change.  duplicate

## 2024-04-25 ENCOUNTER — Ambulatory Visit: Payer: Self-pay | Admitting: Physician Assistant

## 2024-04-25 NOTE — Progress Notes (Signed)
RF negative.

## 2024-04-25 NOTE — Progress Notes (Signed)
 Total bilirubin is slightly elevated-1.4. rest of CMP WNL.  Please forward results to PCP  CBC WNL ESR WNL  UA normal  Complements WNL

## 2024-04-26 LAB — COMPREHENSIVE METABOLIC PANEL WITH GFR
AG Ratio: 1.6 (calc) (ref 1.0–2.5)
ALT: 20 U/L (ref 9–46)
AST: 17 U/L (ref 10–35)
Albumin: 4.6 g/dL (ref 3.6–5.1)
Alkaline phosphatase (APISO): 42 U/L (ref 35–144)
BUN: 11 mg/dL (ref 7–25)
CO2: 25 mmol/L (ref 20–32)
Calcium: 9.5 mg/dL (ref 8.6–10.3)
Chloride: 103 mmol/L (ref 98–110)
Creat: 0.91 mg/dL (ref 0.70–1.30)
Globulin: 2.8 g/dL (ref 1.9–3.7)
Glucose, Bld: 94 mg/dL (ref 65–99)
Potassium: 4.5 mmol/L (ref 3.5–5.3)
Sodium: 139 mmol/L (ref 135–146)
Total Bilirubin: 1.4 mg/dL — ABNORMAL HIGH (ref 0.2–1.2)
Total Protein: 7.4 g/dL (ref 6.1–8.1)
eGFR: 97 mL/min/1.73m2 (ref >=60)

## 2024-04-26 LAB — URINALYSIS, ROUTINE W REFLEX MICROSCOPIC
Bilirubin Urine: NEGATIVE
Glucose, UA: NEGATIVE
Hgb urine dipstick: NEGATIVE
Ketones, ur: NEGATIVE
Leukocytes,Ua: NEGATIVE
Nitrite: NEGATIVE
Protein, ur: NEGATIVE
Specific Gravity, Urine: 1.003 (ref 1.001–1.035)
pH: 7.5 (ref 5.0–8.0)

## 2024-04-26 LAB — CBC WITH DIFFERENTIAL/PLATELET
Absolute Lymphocytes: 1141 {cells}/uL (ref 850–3900)
Absolute Monocytes: 534 {cells}/uL (ref 200–950)
Basophils Absolute: 32 {cells}/uL (ref 0–200)
Basophils Relative: 0.7 %
Eosinophils Absolute: 120 {cells}/uL (ref 15–500)
Eosinophils Relative: 2.6 %
HCT: 46.3 % (ref 38.5–50.0)
Hemoglobin: 15.4 g/dL (ref 13.2–17.1)
MCH: 31.7 pg (ref 27.0–33.0)
MCHC: 33.3 g/dL (ref 32.0–36.0)
MCV: 95.3 fL (ref 80.0–100.0)
MPV: 10.6 fL (ref 7.5–12.5)
Monocytes Relative: 11.6 %
Neutro Abs: 2774 {cells}/uL (ref 1500–7800)
Neutrophils Relative %: 60.3 %
Platelets: 238 Thousand/uL (ref 140–400)
RBC: 4.86 Million/uL (ref 4.20–5.80)
RDW: 12.2 % (ref 11.0–15.0)
Total Lymphocyte: 24.8 %
WBC: 4.6 Thousand/uL (ref 3.8–10.8)

## 2024-04-26 LAB — PROTEIN ELECTROPHORESIS, SERUM, WITH REFLEX
Albumin ELP: 4.5 g/dL (ref 3.8–4.8)
Alpha 1: 0.2 g/dL (ref 0.2–0.3)
Alpha 2: 0.6 g/dL (ref 0.5–0.9)
Beta 2: 0.5 g/dL (ref 0.2–0.5)
Beta Globulin: 0.4 g/dL (ref 0.4–0.6)
Gamma Globulin: 1.1 g/dL (ref 0.8–1.7)
Total Protein: 7.4 g/dL (ref 6.1–8.1)

## 2024-04-26 LAB — SJOGRENS SYNDROME-A EXTRACTABLE NUCLEAR ANTIBODY: SSA (Ro) (ENA) Antibody, IgG: >8 AI — AB

## 2024-04-26 LAB — C3 AND C4
C3 Complement: 158 mg/dL (ref 82–185)
C4 Complement: 20 mg/dL (ref 15–53)

## 2024-04-26 LAB — SEDIMENTATION RATE: Sed Rate: 6 mm/h (ref 0–20)

## 2024-04-26 LAB — RHEUMATOID FACTOR: Rheumatoid fact SerPl-aCnc: 10 [IU]/mL (ref <14)

## 2024-04-26 LAB — SJOGRENS SYNDROME-B EXTRACTABLE NUCLEAR ANTIBODY: SSB (La) (ENA) Antibody, IgG: 2.4 AI — AB

## 2024-04-28 NOTE — Progress Notes (Signed)
 Ro and La antibodies remain positive.   SPEP normal-good news!

## 2024-05-13 ENCOUNTER — Other Ambulatory Visit: Payer: Self-pay | Admitting: Internal Medicine

## 2024-05-13 DIAGNOSIS — Z2989 Encounter for other specified prophylactic measures: Secondary | ICD-10-CM

## 2024-07-02 ENCOUNTER — Other Ambulatory Visit: Payer: Self-pay | Admitting: Internal Medicine

## 2024-07-02 DIAGNOSIS — J849 Interstitial pulmonary disease, unspecified: Secondary | ICD-10-CM

## 2024-07-02 NOTE — Telephone Encounter (Signed)
 Pt requesting refill of specialty medication - routing to Rx team to advise.

## 2024-07-05 ENCOUNTER — Ambulatory Visit: Payer: Self-pay | Admitting: Internal Medicine

## 2024-07-05 NOTE — Progress Notes (Signed)
 Sorry if I did not release this result 5 mnhs ago. But they are normal. Will see you Jan 2026

## 2024-07-07 NOTE — Telephone Encounter (Signed)
 Refill sent for CELLCEPT  to [facility]: [phone]  Dose: 1000mg  in the morning and 500mg  in the evening (see 04/23/24 phone thread)   Last OV: 01/08/24 (pulmonary), 03/03/24 (rheumatology)  Provider: Dr. Geronimo Pertinent labs: CBC and CMP 04/24/24  Next OV: 07/17/24 (pulmonary), 08/04/24 (rheumatology)   Aleck Puls, PharmD, BCPS Clinical Pharmacist  [facility]

## 2024-07-17 ENCOUNTER — Ambulatory Visit: Admitting: Internal Medicine

## 2024-07-18 ENCOUNTER — Telehealth: Payer: Self-pay

## 2024-07-18 DIAGNOSIS — J849 Interstitial pulmonary disease, unspecified: Secondary | ICD-10-CM

## 2024-07-18 NOTE — Telephone Encounter (Signed)
 Copied from CRM #[id number]. Topic: Appointments - Scheduling Inquiry for Clinic >> Jul 16, 2024 10:59 AM Leila BROCKS wrote: Reason for CRM: Patient [phone] states due to work issues, needs to reschedule 07/17/24 at 8:30 am with Dr. Geronimo. Informed patient, next available appointment with Dr. Geronimo 11/06/24. Patient thinks he needs to have a PFT, unsure if it was not scheduled. Informed patient, last office visit with Dr. Geronimo 01/08/24, Follow-up- 69-month; 15-minute visit but after spirometry and DLCO. There's no orders in the system, patient is asking for call back when the orders are in the system to reschedule. Please call back.  PFT order placed. Called and spoke with the pt and scheduled PFT & OV with Dr. Geronimo. Pt verbalized understanding, nothing further needed.

## 2024-07-21 NOTE — Progress Notes (Unsigned)
 "  Office Visit Note  Patient: Joseph Hendricks             Date of Birth: April 23, 1966           MRN: [id number]             PCP: Bertrum Charlie CROME, MD Referring: Bertrum Charlie CROME, MD Visit Date: 08/04/2024 Occupation: Data Unavailable  Subjective:  No chief complaint on file.   History of Present Illness: Joseph Hendricks is a 59 y.o. male ***   NSIP pattern.  He has been off prednisone .  He has been on CellCept  500 mg 2 tablets p.o. twice daily since June 2024. Clinically stable--reviewed Dr. Reeves office visit note from 01/08/2024--pulmonary function testing has improved.  Symptoms have improved and the high-resolution chest CT has also improved from April 2025.  No need for oral corticosteroids or antifibrotic agents at this time. Dr. Geronimo had reached out on 01/08/2024 regarding whether to consider reducing the dose of CellCept  or discontinuing since he has had a major improvement clinically.  Activities of Daily Living:  Patient reports morning stiffness for *** {minute/hour:19697}.   Patient {ACTIONS;DENIES/REPORTS:21021675::Denies} nocturnal pain.  Difficulty dressing/grooming: {ACTIONS;DENIES/REPORTS:21021675::Denies} Difficulty climbing stairs: {ACTIONS;DENIES/REPORTS:21021675::Denies} Difficulty getting out of chair: {ACTIONS;DENIES/REPORTS:21021675::Denies} Difficulty using hands for taps, buttons, cutlery, and/or writing: {ACTIONS;DENIES/REPORTS:21021675::Denies}  No Rheumatology ROS completed.   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   in [place] - patient is not aware of this dx as of 11/03/22   Asthma    patient denies this dx as 11/03/22   Chronic cough 10/2022   GERD (gastroesophageal reflux disease)    HLD (hyperlipidemia)    Interstitial lung disease (HCC) 10/2022   Pneumonia    x 1   Seasonal allergies    Sjogren syndrome    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   right forearm, left wrist, calf   Tuberculosis    patient denies this dx as of 11/03/22    Family History  Problem Relation Age of Onset   Arthritis Mother        osteo   Factor V Leiden deficiency Mother    Diabetes Father    Alzheimer's disease Father    Healthy Sister    Allergies Brother    Cancer Brother        testicular   Healthy Brother    Healthy Son    Healthy Daughter    Healthy Daughter    Past Surgical History:  Procedure Laterality Date   APPENDECTOMY     BRONCHIAL BIOPSY  11/06/2022   Procedure: BRONCHIAL BIOPSIES;  Surgeon: Shelah Lamar GORMAN, MD;  Location: [facility];  Service: Pulmonary;;   BRONCHIAL WASHINGS  11/06/2022   Procedure: BRONCHIAL WASHINGS;  Surgeon: Shelah Lamar GORMAN, MD;  Location: [facility];  Service: Pulmonary;;   COLONOSCOPY     HERNIA REPAIR     KNEE SURGERY  Left    SHOULDER SURGERY Right    SQUAMOUS CELL CARCINOMA EXCISION     x3 or 4 per patient, bilateral arms, back and leg   VIDEO BRONCHOSCOPY Bilateral 11/06/2022   Procedure: VIDEO BRONCHOSCOPY WITH FLUORO;  Surgeon: Shelah Lamar RAMAN, MD;  Location: [facility];  Service: Pulmonary;  Laterality: Bilateral;   WISDOM TOOTH EXTRACTION     Social History[1] Social History   Social History Narrative   Not on file     Immunization History  Administered Date(s) Administered    sv, Bivalent, Protein Subunit Rsvpref,pf (Abrysvo)  09/02/2023   Hepatitis B 01/24/2002   Influenza Inj Mdck Quad Pf 05/30/2022   Influenza,inj,Quad PF,6+ Mos 04/12/2016, 04/28/2020, 05/04/2021   Influenza-Unspecified 04/10/2015, 03/29/2023   Moderna Covid-19 Fall Seasonal Vaccine 62yrs & older 05/30/2022   Moderna Covid-19 Vaccine  Bivalent Booster 65yrs & up 05/19/2021   Moderna Sars-Covid-2 Vaccination 09/17/2019, 10/17/2019, 05/28/2020   PNEUMOCOCCAL CONJUGATE-20 04/09/2023   Pfizer Covid-19 Vaccine Bivalent Booster 2yrs & up 03/29/2023   Pneumococcal Polysaccharide-23 06/09/2014   Td 04/23/2018   Tdap 02/03/2008   Typhoid Inactivated 01/24/2002   Zoster Recombinant(Shingrix ) 04/28/2020     Objective: Vital Signs: There were no vitals taken for this visit.   Physical Exam   Musculoskeletal Exam: ***  CDAI Exam: CDAI Score: -- Patient Global: --; Provider Global: -- Swollen: --; Tender: -- Joint Exam 08/04/2024   No joint exam has been documented for this visit   There is currently no information documented on the homunculus. Go to the Rheumatology activity and complete the homunculus joint exam.  Investigation: No additional findings.  Imaging: No results found.  Recent Labs: Lab Results  Component Value Date   WBC 4.6 04/24/2024   HGB 15.4 04/24/2024   PLT 238 04/24/2024   NA 139 04/24/2024   K 4.5 04/24/2024   CL 103 04/24/2024   CO2 25 04/24/2024   GLUCOSE 94 04/24/2024   BUN 11 04/24/2024   CREATININE 0.91 04/24/2024   BILITOT 1.4 (H) 04/24/2024   ALKPHOS 43 01/08/2024   AST 17 04/24/2024   ALT 20 04/24/2024   PROT 7.4 04/24/2024   PROT 7.4 04/24/2024   ALBUMIN 4.5 01/08/2024   CALCIUM  9.5 04/24/2024   GFRAA 101 04/29/2020   QFTBGOLDPLUS NEGATIVE 12/19/2022    Speciality Comments: No specialty comments available.  Procedures:  No procedures performed Allergies: Patient has no known allergies.   Assessment / Plan:     Visit Diagnoses: Sjogren's syndrome with other organ involvement  High  risk medication use  ILD (interstitial lung disease) (HCC)  Chronic pain of both shoulders  Chronic SI joint pain  Seasonal allergic rhinitis, unspecified trigger  Mild intermittent asthma without complication  Gastroesophageal reflux disease without esophagitis  Benign fibroma of prostate  Bergmann's syndrome  Basal cell carcinoma (BCC), unspecified site  Hypercholesteremia  Family history of neurological disease-Alzheimer's in his father and paternal grandmother  Orders: No orders of the defined types were placed in this encounter.  No orders of the defined types were placed in this encounter.   Face-to-face time spent with patient was *** minutes. Greater than 50% of time was spent in counseling and coordination of care.  Follow-Up Instructions: No follow-ups on file.   Waddell CHRISTELLA Craze, PA-C  Note - This record has been created using Dragon software.  Chart creation errors have been sought, but may not always  have been located. Such creation errors do not reflect on  the standard of medical care.     [  1]  Social History Tobacco Use   Smoking status: Never    Passive exposure: Never   Smokeless tobacco: Never  Vaping Use   Vaping status: Never Used  Substance Use Topics   Alcohol use: Yes    Comment: occ   Drug use: No   "

## 2024-08-04 ENCOUNTER — Ambulatory Visit: Admitting: Physician Assistant

## 2024-08-04 DIAGNOSIS — K449 Diaphragmatic hernia without obstruction or gangrene: Secondary | ICD-10-CM

## 2024-08-04 DIAGNOSIS — N400 Enlarged prostate without lower urinary tract symptoms: Secondary | ICD-10-CM

## 2024-08-04 DIAGNOSIS — Z820 Family history of epilepsy and other diseases of the nervous system: Secondary | ICD-10-CM

## 2024-08-04 DIAGNOSIS — Z79899 Other long term (current) drug therapy: Secondary | ICD-10-CM

## 2024-08-04 DIAGNOSIS — M3509 Sicca syndrome with other organ involvement: Secondary | ICD-10-CM

## 2024-08-04 DIAGNOSIS — J4520 Mild intermittent asthma, uncomplicated: Secondary | ICD-10-CM

## 2024-08-04 DIAGNOSIS — K219 Gastro-esophageal reflux disease without esophagitis: Secondary | ICD-10-CM

## 2024-08-04 DIAGNOSIS — J849 Interstitial pulmonary disease, unspecified: Secondary | ICD-10-CM

## 2024-08-04 DIAGNOSIS — E7800 Pure hypercholesterolemia, unspecified: Secondary | ICD-10-CM

## 2024-08-04 DIAGNOSIS — J302 Other seasonal allergic rhinitis: Secondary | ICD-10-CM

## 2024-08-04 DIAGNOSIS — C4491 Basal cell carcinoma of skin, unspecified: Secondary | ICD-10-CM

## 2024-08-04 DIAGNOSIS — G8929 Other chronic pain: Secondary | ICD-10-CM

## 2024-08-14 ENCOUNTER — Encounter

## 2024-08-19 ENCOUNTER — Ambulatory Visit: Admitting: Internal Medicine

## 2024-09-11 ENCOUNTER — Encounter

## 2024-09-15 ENCOUNTER — Ambulatory Visit: Admitting: Internal Medicine

## 2024-09-16 ENCOUNTER — Ambulatory Visit: Admitting: Physician Assistant
